# Patient Record
Sex: Male | Born: 1940
Health system: Southern US, Community
[De-identification: ages and names within clinical notes are randomized; demographics above are authoritative.]

## PROBLEM LIST (undated history)

## (undated) DIAGNOSIS — R519 Headache, unspecified: Secondary | ICD-10-CM

## (undated) DIAGNOSIS — M1990 Unspecified osteoarthritis, unspecified site: Secondary | ICD-10-CM

## (undated) DIAGNOSIS — C439 Malignant melanoma of skin, unspecified: Secondary | ICD-10-CM

## (undated) DIAGNOSIS — I719 Aortic aneurysm of unspecified site, without rupture: Secondary | ICD-10-CM

## (undated) DIAGNOSIS — G4762 Sleep related leg cramps: Secondary | ICD-10-CM

## (undated) DIAGNOSIS — IMO0001 Reserved for inherently not codable concepts without codable children: Secondary | ICD-10-CM

## (undated) DIAGNOSIS — H8102 Meniere's disease, left ear: Secondary | ICD-10-CM

## (undated) DIAGNOSIS — M419 Scoliosis, unspecified: Secondary | ICD-10-CM

## (undated) DIAGNOSIS — K56609 Unspecified intestinal obstruction, unspecified as to partial versus complete obstruction: Secondary | ICD-10-CM

## (undated) DIAGNOSIS — IMO0002 Reserved for concepts with insufficient information to code with codable children: Secondary | ICD-10-CM

## (undated) DIAGNOSIS — R55 Syncope and collapse: Secondary | ICD-10-CM

## (undated) DIAGNOSIS — B029 Zoster without complications: Secondary | ICD-10-CM

## (undated) DIAGNOSIS — R51 Headache: Secondary | ICD-10-CM

## (undated) DIAGNOSIS — K219 Gastro-esophageal reflux disease without esophagitis: Secondary | ICD-10-CM

## (undated) DIAGNOSIS — I10 Essential (primary) hypertension: Secondary | ICD-10-CM

## (undated) DIAGNOSIS — E7800 Pure hypercholesterolemia, unspecified: Secondary | ICD-10-CM

## (undated) HISTORY — PX: TONSILLECTOMY: SUR1361

## (undated) HISTORY — DX: Reserved for inherently not codable concepts without codable children: IMO0001

## (undated) HISTORY — DX: Malignant melanoma of skin, unspecified: C43.9

## (undated) HISTORY — PX: MELANOMA EXCISION: SHX5266

## (undated) HISTORY — PX: OTHER SURGICAL HISTORY: SHX169

## (undated) HISTORY — DX: Reserved for concepts with insufficient information to code with codable children: IMO0002

## (undated) HISTORY — DX: Unspecified intestinal obstruction, unspecified as to partial versus complete obstruction: K56.609

## (undated) HISTORY — PX: VASECTOMY: SHX75

## (undated) HISTORY — PX: COLONOSCOPY: SHX174

## (undated) HISTORY — PX: CATARACT EXTRACTION W/ INTRAOCULAR LENS  IMPLANT, BILATERAL: SHX1307

## (undated) HISTORY — PX: HERNIA REPAIR: SHX51

## (undated) HISTORY — PX: APPENDECTOMY: SHX54

## (undated) HISTORY — PX: COLON SURGERY: SHX602

---

## 2000-03-24 ENCOUNTER — Encounter: Admission: RE | Admit: 2000-03-24 | Discharge: 2000-03-24 | Payer: Self-pay | Admitting: *Deleted

## 2000-03-24 ENCOUNTER — Encounter: Payer: Self-pay | Admitting: *Deleted

## 2002-11-25 ENCOUNTER — Ambulatory Visit (HOSPITAL_BASED_OUTPATIENT_CLINIC_OR_DEPARTMENT_OTHER): Admission: RE | Admit: 2002-11-25 | Discharge: 2002-11-25 | Payer: Self-pay | Admitting: Surgery

## 2006-04-20 ENCOUNTER — Encounter: Admission: RE | Admit: 2006-04-20 | Discharge: 2006-04-20 | Payer: Self-pay | Admitting: Internal Medicine

## 2007-06-01 ENCOUNTER — Ambulatory Visit: Payer: Self-pay | Admitting: Gastroenterology

## 2007-06-09 ENCOUNTER — Ambulatory Visit: Payer: Self-pay | Admitting: Gastroenterology

## 2008-11-06 ENCOUNTER — Encounter: Payer: Self-pay | Admitting: Gastroenterology

## 2009-05-07 ENCOUNTER — Encounter: Payer: Self-pay | Admitting: Gastroenterology

## 2010-02-01 ENCOUNTER — Encounter: Payer: Self-pay | Admitting: Gastroenterology

## 2010-07-23 NOTE — Letter (Signed)
Summary: [facility]  [facility]   Imported By: Lester Benld 02/26/2010 12:04:31  _____________________________________________________________________  External Attachment:    Type:   Image     Comment:   External Document

## 2010-11-08 NOTE — Op Note (Signed)
NAME:  George Montoya, George Montoya                         ACCOUNT NO.:  [id number]   MEDICAL RECORD NO.:  [id number]                   PATIENT TYPE:  AMB   LOCATION:  DSC                                  FACILITY:  [facility]   PHYSICIAN:  Currie Paris, M.D.           DATE OF BIRTH:  1941/03/13   DATE OF PROCEDURE:  11/25/2002  DATE OF DISCHARGE:                                 OPERATIVE REPORT   OFFICE MEDICAL RECORD NUMBER:  [id number]   PREOPERATIVE DIAGNOSIS:  Right inguinal hernia.   POSTOPERATIVE DIAGNOSIS:  Right inguinal hernia.   OPERATION:  Repair of right inguinal hernia with mesh.   SURGEON:  Currie Paris, M.D.   ANESTHESIA:  MAC.   CLINICAL HISTORY:  This is a 70 year old gentleman with a right inguinal  hernia he desired to have repaired.   DESCRIPTION OF PROCEDURE:  The patient seen in the holding area and had no  further questions.  The right inguinal area was identified as the operative  site and marked.  He was taken to the operating room and given IV sedation.  The groin area was shaved, prepped, and draped.  A combination of 1%  Xylocaine with epinephrine and 0.5% plain Marcaine was mixed in equal parts  and used for local.  It was infiltrated along the incision line and  infiltrated above the anterior-superior iliac spine subfascially.  Incision  was made and deep in the external oblique aponeurosis with additional local  infiltrate as we got into deeper layers.  Bleeders were electrocoagulated or  tied with 4-0 Vicryl.  The external oblique was cleared off and opened in  line of its fibers and elevated off the underlying cord structures.  The  cord was dissected up off the inguinal floor.  There was a protrusion of  preperitoneal fat coming through adjacent to the cord and was stripped off  the cord and reduced.  This appeared to be basically an indirect defect with  a very large, deep ring, although this did not have the usual occurrence of  a true sac.   Cord was skeletonized.  We did not see any other sacs coming  through.  The medial aspect of the floor appeared intact.   I took a large mesh plug and inserted it into the laterally located defect  and closed it with a 2-0 Prolene.  The ilioinguinal nerve appeared to be  coming nicely through with the cord structures.   I then placed the mesh patch on and laid it over the entire floor and let  the tails go laterally around the cord.  I used 2-0 Prolene to suture it in  with a running suture along the inferior aspect and then tacked it medially  and superiorly to the internal oblique.  The tails were tacked together  where they crossed to recreate a deep ring.  Everything appeared to be dry,  and there appeared to  be no tension on this, and it laid nicely.   The external oblique was then closed with a running 3-0 Vicryl, Scarpa's  with a running 3-0 Vicryl, and the skin with 4-0 Monocryl subcuticular plus  some Dermabond.   The patient tolerated the procedure well.  There were no operative  complications, and all counts were correct.                                               Currie Paris, M.D.    CJS/MEDQ  D:  11/25/2002  T:  11/25/2002  Job:  [id number]   cc:   Wilson Singer, M.D.  [address]  [place]  [place] [address]  Fax: [phone]

## 2014-02-01 ENCOUNTER — Encounter (HOSPITAL_COMMUNITY): Payer: Self-pay | Admitting: Emergency Medicine

## 2014-02-01 ENCOUNTER — Emergency Department (HOSPITAL_COMMUNITY): Payer: 59

## 2014-02-01 ENCOUNTER — Ambulatory Visit (INDEPENDENT_AMBULATORY_CARE_PROVIDER_SITE_OTHER): Payer: 59 | Admitting: Family Medicine

## 2014-02-01 ENCOUNTER — Emergency Department (HOSPITAL_COMMUNITY)
Admission: EM | Admit: 2014-02-01 | Discharge: 2014-02-01 | Disposition: A | Discharge status: Home / Self Care | Payer: 59 | Attending: Emergency Medicine | Admitting: Emergency Medicine

## 2014-02-01 VITALS — BP 190/92 | HR 61 | Temp 97.9°F | Resp 16 | Ht 68.0 in | Wt 195.0 lb

## 2014-02-01 DIAGNOSIS — R509 Fever, unspecified: Secondary | ICD-10-CM | POA: Diagnosis not present

## 2014-02-01 DIAGNOSIS — R4020 Unspecified coma: Secondary | ICD-10-CM

## 2014-02-01 DIAGNOSIS — Z79899 Other long term (current) drug therapy: Secondary | ICD-10-CM | POA: Insufficient documentation

## 2014-02-01 DIAGNOSIS — I10 Essential (primary) hypertension: Secondary | ICD-10-CM

## 2014-02-01 DIAGNOSIS — E7800 Pure hypercholesterolemia, unspecified: Secondary | ICD-10-CM | POA: Insufficient documentation

## 2014-02-01 DIAGNOSIS — R404 Transient alteration of awareness: Secondary | ICD-10-CM | POA: Insufficient documentation

## 2014-02-01 DIAGNOSIS — R55 Syncope and collapse: Secondary | ICD-10-CM | POA: Diagnosis not present

## 2014-02-01 DIAGNOSIS — Z87891 Personal history of nicotine dependence: Secondary | ICD-10-CM | POA: Diagnosis not present

## 2014-02-01 DIAGNOSIS — E871 Hypo-osmolality and hyponatremia: Secondary | ICD-10-CM | POA: Diagnosis not present

## 2014-02-01 HISTORY — DX: Pure hypercholesterolemia, unspecified: E78.00

## 2014-02-01 HISTORY — DX: Essential (primary) hypertension: I10

## 2014-02-01 LAB — POCT CBC
Granulocyte percent: 87.4 %G — AB (ref 37–80)
HCT, POC: 42.7 % — AB (ref 43.5–53.7)
Hemoglobin: 14.3 g/dL (ref 14.1–18.1)
Lymph, poc: 0.5 — AB (ref 0.6–3.4)
MCH, POC: 34.3 pg — AB (ref 27–31.2)
MCHC: 33.4 g/dL (ref 31.8–35.4)
MCV: 102.6 fL — AB (ref 80–97)
MID (cbc): 0.4 (ref 0–0.9)
MPV: 6.5 fL (ref 0–99.8)
POC Granulocyte: 6.3 (ref 2–6.9)
POC LYMPH PERCENT: 7.2 %L — AB (ref 10–50)
POC MID %: 5.4 %M (ref 0–12)
Platelet Count, POC: 189 10*3/uL (ref 142–424)
RBC: 4.16 M/uL — AB (ref 4.69–6.13)
RDW, POC: 12.8 %
WBC: 7.2 10*3/uL (ref 4.6–10.2)

## 2014-02-01 LAB — BASIC METABOLIC PANEL
Anion gap: 18 — ABNORMAL HIGH (ref 5–15)
BUN: 12 mg/dL (ref 6–23)
CALCIUM: 9.4 mg/dL (ref 8.4–10.5)
CO2: 21 mEq/L (ref 19–32)
Chloride: 88 mEq/L — ABNORMAL LOW (ref 96–112)
Creatinine, Ser: 0.82 mg/dL (ref 0.50–1.35)
GFR calc Af Amer: >90 mL/min (ref >90)
GFR, EST NON AFRICAN AMERICAN: 86 mL/min — AB (ref >90)
Glucose, Bld: 111 mg/dL — ABNORMAL HIGH (ref 70–99)
Potassium: 3.7 mEq/L (ref 3.7–5.3)
Sodium: 127 mEq/L — ABNORMAL LOW (ref 137–147)

## 2014-02-01 LAB — CBC
HEMATOCRIT: 37.6 % — AB (ref 39.0–52.0)
Hemoglobin: 13.6 g/dL (ref 13.0–17.0)
MCH: 35 pg — AB (ref 26.0–34.0)
MCHC: 36.2 g/dL — AB (ref 30.0–36.0)
MCV: 96.7 fL (ref 78.0–100.0)
Platelets: 174 10*3/uL (ref 150–400)
RBC: 3.89 MIL/uL — ABNORMAL LOW (ref 4.22–5.81)
RDW: 11.9 % (ref 11.5–15.5)
WBC: 7.6 10*3/uL (ref 4.0–10.5)

## 2014-02-01 LAB — I-STAT TROPONIN, ED: Troponin i, poc: 0 ng/mL (ref 0.00–0.08)

## 2014-02-01 LAB — GLUCOSE, POCT (MANUAL RESULT ENTRY): POC GLUCOSE: 112 mg/dL — AB (ref 70–99)

## 2014-02-01 MED ORDER — SODIUM CHLORIDE 0.9 % IV SOLN
Freq: Once | INTRAVENOUS | Status: AC
  Administered 2014-02-01: 21:00:00 via INTRAVENOUS

## 2014-02-01 NOTE — ED Notes (Signed)
Pt reports last night have fever/chills and then today had witnessed syncopal episode at 1500 lasting approx 1 minute. bp is 185/81 at triage.

## 2014-02-01 NOTE — ED Notes (Signed)
Pt placed in gown and in bed. PT monitored by pulse ox, bp cuff, and 12-lead.

## 2014-02-01 NOTE — ED Provider Notes (Signed)
CSN: [id number]     Arrival date & time 02/01/14  1710 History   First MD Initiated Contact with Patient 02/01/14 1853     Chief Complaint  Patient presents with  . Loss of Consciousness     (Consider location/radiation/quality/duration/timing/severity/associated sxs/prior Treatment) Patient is a 73 y.o. male presenting with syncope.  Loss of Consciousness Associated symptoms: diaphoresis and fever   Associated symptoms: no chest pain, no dizziness, no headaches, no nausea, no palpitations, no shortness of breath, no vomiting and no weakness     George Montoya is a 73 year old man with Meniere disease and HTN who presents after syncopal episode this afternoon. He was sitting at his desk at work when he had a witnessed ~1 minute loss of consciousness. He started feeling lightheaded with vision changes when he lost consciousness. A coworker said his eyes rolled back in his head which nodded off to the side. He did not fall out of the chair or hit his head. He then woke up and did not remember the event but also was not posticteric. He denied any loss of bowel or bladder continence. There were no other observed movements besides the eye roll and head nod. He woke up drenched in sweat. His [place] prior [place] the [place] ~ [place] when he believes he was dehydrated. He says this is different than his episodes of Meniere as he was not dizzy or nauseous. He says he had diarrhea last night without hematochezia or melena and a subjective fever after an epson salt bath. He initially went to urgent care who sent him to [facility].   Past Medical History  Diagnosis Date  . High cholesterol   . Hypertension    History reviewed. No pertinent past surgical history. History reviewed. No pertinent family history. History  Substance Use Topics  . Smoking status: Former Research scientist (life sciences)  . Smokeless tobacco: Not on file  . Alcohol Use: No     Comment: occ    Review of Systems  Constitutional:  Positive for fever, chills and diaphoresis. Negative for fatigue and unexpected weight change.  Respiratory: Negative for shortness of breath.   Cardiovascular: Positive for syncope. Negative for chest pain and palpitations.  Gastrointestinal: Positive for diarrhea. Negative for nausea, vomiting, abdominal pain and constipation.  Neurological: Positive for syncope and light-headedness. Negative for dizziness, speech difficulty, weakness, numbness and headaches.      Allergies  Adhesive  Home Medications   Prior to Admission medications   Medication Sig Start Date End Date Taking? Authorizing Provider  carvedilol (COREG) 25 MG tablet Take 25 mg by mouth every morning.    Yes Historical Provider, MD  Chlorpheniramine Maleate (ALLERGY PO) Take 1 tablet by mouth every morning.   Yes Historical Provider, MD  CHOLECALCIFEROL PO Take 600 mg by mouth every morning.   Yes Historical Provider, MD  diphenhydrAMINE (BENADRYL) 25 MG tablet Take 25 mg by mouth every morning.   Yes Historical Provider, MD  Multiple Vitamins-Minerals (MULTIVITAL) tablet Take 1 tablet by mouth daily.   Yes Historical Provider, MD  rosuvastatin (CRESTOR) 10 MG tablet Take 10 mg by mouth every Monday, Wednesday, and Friday. In AM   Yes Historical Provider, MD  telmisartan-hydrochlorothiazide (MICARDIS HCT) 40-12.5 MG per tablet Take 1 tablet by mouth every morning.    Yes Historical Provider, MD   BP 158/82  Pulse 63  Temp(Src) 97.9 F (36.6 C) (Oral)  Resp 18  SpO2 98% Physical Exam  Constitutional:  He is oriented to person, place, and time. He appears well-developed and well-nourished. No distress.  HENT:  Head: Normocephalic and atraumatic.  Mouth/Throat: Oropharynx is clear and moist.  Eyes: EOM are normal. Pupils are equal, round, and reactive to light. No scleral icterus.  Neck: No JVD present.  No carotid bruit  Cardiovascular: Normal rate, regular rhythm, normal heart sounds and intact distal pulses.  Exam  reveals no gallop and no friction rub.   No murmur heard. Pulmonary/Chest: Effort normal and breath sounds normal. No respiratory distress. He has no wheezes. He has no rales. He exhibits no tenderness.  Abdominal: Soft. Bowel sounds are normal. He exhibits no distension. There is no tenderness.  Musculoskeletal: He exhibits no edema.  Neurological: He is alert and oriented to person, place, and time. He has normal reflexes. No cranial nerve deficit. He exhibits normal muscle tone. Coordination normal.  Skin: He is not diaphoretic.    ED Course  Procedures (including critical care time) Labs Review Labs Reviewed  CBC - Abnormal; Notable for the following:    RBC 3.89 (*)    HCT 37.6 (*)    MCH 35.0 (*)    MCHC 36.2 (*)    All other components within normal limits  BASIC METABOLIC PANEL - Abnormal; Notable for the following:    Sodium 127 (*)    Chloride 88 (*)    Glucose, Bld 111 (*)    GFR calc non Af Amer 86 (*)    Anion gap 18 (*)    All other components within normal limits  Randolm Idol, ED    Imaging Review Dg Chest 2 View  02/01/2014   CLINICAL DATA:  Hypertension.  Loss of consciousness.  EXAM: CHEST  2 VIEW  COMPARISON:  None.  FINDINGS: Heart, mediastinum hila are unremarkable. Lungs are clear. No pleural effusion or pneumothorax  Bony thorax is demineralized but intact.  IMPRESSION: No active cardiopulmonary disease.   Electronically Signed   By: Lajean Manes M.D.   On: 02/01/2014 20:23     EKG Interpretation   Date/Time:  Wednesday February 01 2014 17:31:25 EDT Ventricular Rate:  60 PR Interval:  180 QRS Duration: 90 QT Interval:  428 QTC Calculation: 428 R Axis:   10 Text Interpretation:  Normal sinus rhythm Normal ECG Confirmed by BEATON   MD, ROBERT ([id number]) on 02/01/2014 7:52:41 PM      MDM   Final diagnoses:  None    7:34PM: Patient has observed syncopal episode in setting of hyponatremia and hypochloremia. He appeared euvolemic on exam and had  BUN/creatinine ratio of 15. Also dehydration not likely in setting of elevated BP. EKG normal sinus rhythm and troponin negative. He thinks this is different than episodes of Meniere disease as he was not dizzy or nauseous. Start NS infusion at 75 cc/hr. Head CT not ordered at this time as no headache, head trauma (he was sitting and observed the whole time), neuro deficits on exam. He will likely need to be admitted with further workup as in-patient.  10:03PM: Dr. Audie Pinto discussed admission with the patient and the importance of cardiac monitoring for arrhythmia. Patient says he will not be admitted although wife wants him to be. Dr. Audie Pinto spoke to Dr. Sharlett Iles who is the patient's PCP who said he will see the patient first thing in the morning and felt comfortable with his discharge under such close follow-up.  Kelby Aline, MD 02/01/14 [phone]

## 2014-02-01 NOTE — ED Provider Notes (Signed)
I saw and evaluated the patient, reviewed the resident's note and I agree with the findings and plan.   .Face to face Exam:  General:  Awake HEENT:  Atraumatic Resp:  Normal effort Abd:  Nondistended Neuro:No focal weakness  I spoke with Dr. Sharlett Iles who agreed to see the patient in the office tomorrow.  I did recommend patient be admitted that he did not want to be admitted and will followup as instructed. .After treatment in the ED the patient feels back to baseline and wants to go home.  Dot Lanes, MD 02/01/14 2214

## 2014-02-01 NOTE — Progress Notes (Signed)
[facility] [address] [place] [address] [phone]  Date:  02/01/2014   Name:  George Montoya   DOB:  04/12/1941   MRN:  [id number]  PCP:  No primary provider on file.    Chief Complaint: Seizures   History of Present Illness:  George Montoya is a 73 y.o. very pleasant male patient who presents with the following:  He had an apparent LOC/ ?seizure about an hour ago.  This was observed by a friend of his. He was sitting in a chair talking, then just seemed to lean back, "my eyes rolled back" and he lost consciousness.  Apparently he was out for a mintue or so. No incont.  It does not sound as though he had any tonic/ clonic movements.  This has never happened in the past.  Right now he feels "a little spacy" but overall not bad.   He did have blood drawn this am for his upcoming physical. He did notice diarrhea yesterday.  He thought this might be due to eating a lot of fruit lately.  He also took a hot epson salts bath last night.  He got a chill last night, then took his temp and it was 100 degrees.   Hie PCP is Dr. Sharlett Iles He does not have any CP, no SOB.  No headache  There are no active problems to display for this patient.   No past medical history on file.  No past surgical history on file.  History  Substance Use Topics  . Smoking status: Not on file  . Smokeless tobacco: Not on file  . Alcohol Use: Not on file    No family history on file.  No Known Allergies  Medication list has been reviewed and updated.  No current outpatient prescriptions on file prior to visit.   No current facility-administered medications on file prior to visit.    Review of Systems:  As per HPI- otherwise negative.   Physical Examination: Filed Vitals:   02/01/14 1555  BP: 190/92  Pulse: 61  Temp: 97.9 F (36.6 C)  Resp: 16   Filed Vitals:   02/01/14 1555  Height: 5\' 8"  (1.727 m)  Weight: 195 lb (88.451 kg)   Body mass index is 29.66  kg/(m^2). Ideal Body Weight: Weight in (lb) to have BMI = 25: 164.1  GEN: WDWN, NAD, Non-toxic, A & O x 3, overweight, looks well HEENT: Atraumatic, Normocephalic. Neck supple. No masses, No LAD.  Bilateral TM wnl, oropharynx normal.  PEERL,EOMI.   Ears and Nose: No external deformity. CV: RRR, No M/G/R. No JVD. No thrill. No extra heart sounds. PULM: CTA B, no wheezes, crackles, rhonchi. No retractions. No resp. distress. No accessory muscle use. ABD: S, NT, ND, +BS. No rebound. No HSM. EXTR: No c/c/e NEURO Normal gait. Normal strength, sensation and DTR all extremities.   PSYCH: Normally interactive. Conversant. Not depressed or anxious appearing.  Calm demeanor.   EKG: no acute abnormality   Results for orders placed in visit on 02/01/14  POCT CBC      Result Value Ref Range   WBC 7.2  4.6 - 10.2 K/uL   Lymph, poc 0.5 (*) 0.6 - 3.4   POC LYMPH PERCENT 7.2 (*) 10 - 50 %L   MID (cbc) 0.4  0 - 0.9   POC MID % 5.4  0 - 12 %M   POC Granulocyte 6.3  2 - 6.9   Granulocyte percent 87.4 (*)  37 - 80 %G   RBC 4.16 (*) 4.69 - 6.13 M/uL   Hemoglobin 14.3  14.1 - 18.1 g/dL   HCT, POC 42.7 (*) 43.5 - 53.7 %   MCV 102.6 (*) 80 - 97 fL   MCH, POC 34.3 (*) 27 - 31.2 pg   MCHC 33.4  31.8 - 35.4 g/dL   RDW, POC 12.8     Platelet Count, POC 189  142 - 424 K/uL   MPV 6.5  0 - 99.8 fL  GLUCOSE, POCT (MANUAL RESULT ENTRY)      Result Value Ref Range   POC Glucose 112 (*) 70 - 99 mg/dl    Assessment and Plan: Loss of consciousness - Plan: POCT CBC, POCT glucose (manual entry), EKG 12-Lead  Essential hypertension   LOC event earlier today.  No apparent cause here at clinic.  Needs further evaluation.  Also has quite high BP- we have not seen him before so do not have any old readings, he feels this is higher than usual due to "white coat" anxiety.  Offered EMS transport- he declines this but his daughter is here to take him to [facility] ER  Signed Lamar Blinks, MD

## 2014-02-01 NOTE — Discharge Instructions (Signed)
You were seen in the ED today for loss of consciousness and found to have low sodium. We have spoken to your primary care provider, Dr. Sharlett Iles, and he has agreed to see you first thing tomorrow morning. Please call his office tomorrow morning to arrange. Please seek medical attention or return to the ED if you have any new loss of consciousness, chest pain, shortness of breath, or other worrisome medical condition.

## 2014-03-20 ENCOUNTER — Encounter: Payer: Self-pay | Admitting: Cardiovascular Disease

## 2014-03-20 ENCOUNTER — Ambulatory Visit (INDEPENDENT_AMBULATORY_CARE_PROVIDER_SITE_OTHER): Payer: 59 | Admitting: Cardiovascular Disease

## 2014-03-20 VITALS — BP 160/96 | HR 65 | Resp 16 | Ht 68.0 in | Wt 193.4 lb

## 2014-03-20 DIAGNOSIS — R55 Syncope and collapse: Secondary | ICD-10-CM

## 2014-03-20 DIAGNOSIS — I10 Essential (primary) hypertension: Secondary | ICD-10-CM

## 2014-03-20 MED ORDER — CARVEDILOL 12.5 MG PO TABS: 12.5000 mg | ORAL_TABLET | Freq: Two times a day (BID) | ORAL | Status: DC

## 2014-03-20 MED ORDER — TELMISARTAN-HCTZ 40-12.5 MG PO TABS: 0.5000 | ORAL_TABLET | Freq: Every day | ORAL | Status: DC

## 2014-03-20 NOTE — Patient Instructions (Signed)
DECREASE Carvedilol to 12.5mg  twice a day.  Take Telmisartan HCTZ 40/12.5mg   1/2 tablet daily.  Your physician has recommended that you wear an event monitor after wearing the BP Monitor for two weeks.  Event monitors are medical devices that record the heart's electrical activity. Doctors most often [place] these monitors to diagnose arrhythmias. Arrhythmias are problems with the speed or rhythm of the heartbeat. The monitor is a small, portable device. You can wear one while you do your normal daily activities. This is usually used to diagnose what is causing palpitations/syncope (passing out).  Dr. Sallyanne Kuster recommends that you schedule a follow-up appointment in: 3 months.

## 2014-03-21 ENCOUNTER — Encounter: Payer: Self-pay | Admitting: Cardiovascular Disease

## 2014-03-21 ENCOUNTER — Telehealth: Payer: Self-pay | Admitting: Cardiovascular Disease

## 2014-03-21 DIAGNOSIS — I10 Essential (primary) hypertension: Secondary | ICD-10-CM | POA: Insufficient documentation

## 2014-03-21 DIAGNOSIS — R55 Syncope and collapse: Secondary | ICD-10-CM | POA: Insufficient documentation

## 2014-03-21 NOTE — Progress Notes (Signed)
Patient ID: George Montoya, male   DOB: 02-Jun-1941, 73 y.o.   MRN: [id number]      Reason for office visit Hypertension, history of syncope  This is Mr. Rue first cardiology evaluation. He has a long-standing history of hypertension. 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 He thinks this was because of stress, weight gain and inactivity . Now that the professional situation has improved, he has been trying to reduce the doses of his antihypertensive medications. His blood pressure today is rather high and was also high when he last saw his primary care physician.  In July, she had an episode of syncope that required brief emergency room evaluation and treatment. He had severe diarrhea with no nausea and vomiting for approximately 24 hours.  Overnight he had chills and felt weak, but that morning he was feeling a little better. The next day he was sitting up in a chair at work when he suddenly lost consciousness.The loss of consciousness was very abrupt, without prodrome. Within about a minute he had recovered. In the emergency room his blood pressure was initially high. Laboratory tests showed marked hyponatremia but were otherwise unremarkable. He recalls having a presyncopal episode earlier in the year that resolved after he laid flat on the floor for a few minutes.   Allergies  Allergen Reactions  . Adhesive [Tape] Rash     Current Outpatient Prescriptions  Medication Sig Dispense Refill  . carvedilol (COREG) 12.5 MG tablet  he was taking 25 mg once daily   180 tablet  3  . Chlorpheniramine Maleate (ALLERGY PO) Take 1 tablet by mouth every morning.      . CHOLECALCIFEROL PO Take 600 mg by mouth every morning.      . diphenhydrAMINE (BENADRYL) 25 MG tablet Take 25 mg by mouth every morning.      . Multiple Vitamins-Minerals (MULTIVITAL) tablet Take 1 tablet by mouth daily.      . rosuvastatin  (CRESTOR) 10 MG tablet Take 10 mg by mouth every Monday, Wednesday, and Friday. In AM      . telmisartan-hydrochlorothiazide (MICARDIS HCT) 40-12.5 MG per tablet he was taking one tablet 3 days a week   45 tablet  3   No current facility-administered medications for this visit.    Past Medical History  Diagnosis Date  . High cholesterol   . Hypertension     No past major surgery  No family history of inheritable cardiac problems.  History   Social History  . Marital Status: Married    Spouse Name: N/A    Number of Children: N/A  . Years of Education: N/A   Occupational History  . Not on file.   Social History Main Topics  . Smoking status: Former Smoker -- 25 years    Quit date: 10/21/1981  . Smokeless tobacco: Not on file  . Alcohol Use: 10.0 oz/week    20 drink(s) per week     Comment: occ  . Drug Use: No  . Sexual Activity: Not on file   Other Topics Concern  . Not on file   Social History Narrative  . No narrative on file    Review of systems: The patient specifically denies any chest pain at rest or with exertion, dyspnea at rest or with exertion, orthopnea, paroxysmal nocturnal dyspnea,  palpitations, focal neurological deficits, intermittent claudication, lower extremity edema, unexplained weight gain, cough, hemoptysis or wheezing.  The patient also  denies abdominal pain, nausea, vomiting, dysphagia, diarrhea, constipation, polyuria, polydipsia, dysuria, hematuria, frequency, urgency, abnormal bleeding or bruising, fever, chills, unexpected weight changes, mood swings, change in skin or hair texture, change in voice quality, auditory or visual problems, allergic reactions or rashes, new musculoskeletal complaints other than usual "aches and pains".   PHYSICAL EXAM BP 160/96  Pulse 65  Resp 16  Ht 5\' 8"  (1.727 m)  Wt 87.726 kg (193 lb 6.4 oz)  BMI 29.41 kg/m2  General: Alert, oriented x3, no distress Head: no evidence of trauma, PERRL, EOMI, no  exophtalmos or lid lag, no myxedema, no xanthelasma; normal ears, nose and oropharynx Neck: normal jugular venous pulsations and no hepatojugular reflux; brisk carotid pulses without delay and no carotid bruits Chest: clear to auscultation, no signs of consolidation by percussion or palpation, normal fremitus, symmetrical and full respiratory excursions Cardiovascular: normal position and quality of the apical impulse, regular rhythm, normal first and second heart sounds, no murmurs, rubs, loud S4 Abdomen: no tenderness or distention, no masses by palpation, no abnormal pulsatility or arterial bruits, normal bowel sounds, no hepatosplenomegaly Extremities: no clubbing, cyanosis or edema; 2+ radial, ulnar and brachial pulses bilaterally; 2+ right femoral, posterior tibial and dorsalis pedis pulses; 2+ left femoral, posterior tibial and dorsalis pedis pulses; no subclavian or femoral bruits Neurological: grossly nonfocal   EKG: Normal sinus rhythm, normal tracing. No evidence of LVH. Normal QTC 430 ms  Lipid Panel  No results found for this basename: chol, trig, hdl, cholhdl, vldl, ldlcalc, ldldirect    BMET    Component Value Date/Time   NA 127* 02/01/2014 1731   K 3.7 02/01/2014 1731   CL 88* 02/01/2014 1731   CO2 21 02/01/2014 1731   GLUCOSE 111* 02/01/2014 1731   BUN 12 02/01/2014 1731   CREATININE 0.82 02/01/2014 1731   CALCIUM 9.4 02/01/2014 1731   GFRNONAA 86* 02/01/2014 1731   GFRAA >90 02/01/2014 1731     ASSESSMENT AND PLAN Syncope The circumstances of his syncopal events (preceded by perfuse diarrhea and chills) in the presence of marked hyponatremia suggest that he had hypotensive syncope due to hypovolemia. On the other hand, the extremely abrupt nature of his syncope, without prodrome is more in keeping with arrhythmic syncope. I suggested he wear a 14 day event monitor.  HTN (hypertension) Although not seen on ECG, his physical exam suggest the presence of left ventricular  hypertrophy. He has an abnormal heart sound. I recommended an echocardiogram. This would also help alleviate concerns about structural heart disease as a cause of syncope. I told him that the current regimen of antihypertensive medications would likely lead to volatility in his blood pressure, since he is taking olmesartan only every other day and carvedilol only once a day, on a schedule that allows complete resolution of antihypertensive effect before the next dose. I understand that he is concerned about "overmedication" . I recommended a lower dose of carvedilol, but dosed twice daily (12.5 mg twice a day) and a lower dose of telmisartan (Micardis HCT half of a 40-12.5 mg tablet) on a daily basis.   Orders Placed This Encounter  Procedures  . EKG 12-Lead  . Cardiac event monitor   Meds ordered this encounter  Medications  . carvedilol (COREG) 12.5 MG tablet    Sig: Take 1 tablet (12.5 mg total) by mouth 2 (two) times daily.    Dispense:  180 tablet    Refill:  3  . telmisartan-hydrochlorothiazide (MICARDIS HCT) 40-12.5 MG per  tablet    Sig: Take 0.5 tablets by mouth daily.    Dispense:  45 tablet    Refill:  [place] Roddrick Sharron, MD, [facility] [phone] office [phone] pager

## 2014-03-21 NOTE — Assessment & Plan Note (Signed)
The circumstances of his syncopal events (preceded by perfuse diarrhea and chills) in the presence of marked hyponatremia suggest that he had hypotensive syncope due to hypovolemia. On the other hand, the extremely abrupt nature of his syncope, without prodrome is more in keeping with arrhythmic syncope. I suggested he wear a 14 day event monitor.

## 2014-03-21 NOTE — Telephone Encounter (Signed)
Closed encounter °

## 2014-03-21 NOTE — Assessment & Plan Note (Signed)
Although not seen on ECG, his physical exam suggest the presence of left ventricular hypertrophy. He has an abnormal heart sound. I recommended an echocardiogram. This would also help alleviate concerns about structural heart disease as a cause of syncope. I told him that the current regimen of antihypertensive medications would likely lead to extreme volatility of his blood pressure since he is taking olmesartan only every other day and carvedilol only once a day, on a schedule that allows complete resolution of antihypertensive effect before the next dose. I understand that he iss concerned about "overmedication" . I recommended a lower dose of carvedilol but dosed twice daily (12.5 mg twice a day) and a lower dose of telmisartan (Micardis HCT half of a 40-12.5 mg tablet) on a daily basis.

## 2014-03-22 ENCOUNTER — Telehealth: Payer: Self-pay | Admitting: *Deleted

## 2014-03-22 NOTE — Telephone Encounter (Signed)
Discuss echo with his wife and left message on patient's cell # to get back to me to discuss having an echo done.

## 2014-03-22 NOTE — Telephone Encounter (Signed)
Message copied by Tressa Busman on Wed Mar 22, 2014  3:50 PM ------      Message from: Sanda Klein      Created: Tue Mar 21, 2014  8:51 AM       Please tell Mr. Chalmers that after reviewing his records I think it would be worthwhile that he have an echocardiogram. Please schedule it if he is agreeable. ------

## 2014-03-24 ENCOUNTER — Telehealth: Payer: Self-pay | Admitting: Cardiovascular Disease

## 2014-03-24 NOTE — Telephone Encounter (Signed)
Closed encounter °

## 2014-03-27 ENCOUNTER — Telehealth: Payer: Self-pay | Admitting: *Deleted

## 2014-03-27 DIAGNOSIS — I517 Cardiomegaly: Secondary | ICD-10-CM

## 2014-03-27 DIAGNOSIS — R55 Syncope and collapse: Secondary | ICD-10-CM

## 2014-03-27 NOTE — Telephone Encounter (Signed)
Message copied by Tressa Busman on Mon Mar 27, 2014  1:12 PM ------      Message from: Miquel Dunn D      Created: Mon Mar 27, 2014  9:47 AM       Hey There      I need an order for an echo on this patient.            Thanks      Ebony ------

## 2014-03-27 NOTE — Telephone Encounter (Signed)
Patient has agreed to have an Echo - order placed - ebony will schedule.

## 2014-03-29 ENCOUNTER — Ambulatory Visit (HOSPITAL_COMMUNITY)
Admission: RE | Admit: 2014-03-29 | Discharge: 2014-03-29 | Disposition: A | Discharge status: Home / Self Care | Payer: 59 | Source: Ambulatory Visit | Attending: Cardiovascular Disease | Admitting: Cardiovascular Disease

## 2014-03-29 DIAGNOSIS — I10 Essential (primary) hypertension: Secondary | ICD-10-CM | POA: Insufficient documentation

## 2014-03-29 DIAGNOSIS — Z87891 Personal history of nicotine dependence: Secondary | ICD-10-CM | POA: Diagnosis not present

## 2014-03-29 DIAGNOSIS — R55 Syncope and collapse: Secondary | ICD-10-CM

## 2014-03-29 DIAGNOSIS — I517 Cardiomegaly: Secondary | ICD-10-CM

## 2014-03-29 DIAGNOSIS — E785 Hyperlipidemia, unspecified: Secondary | ICD-10-CM | POA: Insufficient documentation

## 2014-03-29 DIAGNOSIS — I519 Heart disease, unspecified: Secondary | ICD-10-CM

## 2014-03-29 NOTE — Progress Notes (Signed)
2D Echocardiogram Complete.  03/29/2014   George Montoya [place], [place]

## 2014-05-13 ENCOUNTER — Encounter (HOSPITAL_COMMUNITY): Payer: Self-pay | Admitting: Emergency Medicine

## 2014-05-13 ENCOUNTER — Emergency Department (HOSPITAL_COMMUNITY)
Admission: EM | Admit: 2014-05-13 | Discharge: 2014-05-13 | Disposition: A | Discharge status: Home / Self Care | Payer: 59 | Source: Home / Self Care | Attending: Emergency Medicine | Admitting: Emergency Medicine

## 2014-05-13 DIAGNOSIS — B0230 Zoster ocular disease, unspecified: Secondary | ICD-10-CM

## 2014-05-13 MED ORDER — VALACYCLOVIR HCL 1 G PO TABS: 1000.0000 mg | ORAL_TABLET | Freq: Three times a day (TID) | ORAL | Status: AC

## 2014-05-13 MED ORDER — TETRACAINE HCL 0.5 % OP SOLN
OPHTHALMIC | Status: AC
  Filled 2014-05-13: qty 2

## 2014-05-13 NOTE — ED Notes (Signed)
Pt C/O a rash and swelling affecting upper right side frontal area, as well mild right eye irritation and discomfort. Denies pain. Onset yesterday.  Seen at an urgent care on [address] this morning, physician recommended that he be seen here. Unable to assess or treat the Pt appropriately at that facility. Pt alert, no signs of acute distress. Shirlean Kelly, CMA

## 2014-05-13 NOTE — Discharge Instructions (Signed)

## 2014-05-13 NOTE — ED Provider Notes (Signed)
CSN: [id number]     Arrival date & time 05/13/14  1138 History   First MD Initiated Contact with Patient 05/13/14 1233     Chief Complaint  Patient presents with  . Eye Problem   (Consider location/radiation/quality/duration/timing/severity/associated sxs/prior Treatment) HPI               73 year old male presents for evaluation of right eye irritation and a rash on his face. This started yesterday morning with slight redness in his forehead and a sensation of numbness behind his eyebrow. Since then his eyes started to feel irritated and red. It is not painful, but just mildly uncomfortable. His vision is not affected. He denies systemic symptoms. He went to a different urgent care, they told him to come here  Past Medical History  Diagnosis Date  . High cholesterol   . Hypertension    History reviewed. No pertinent past surgical history. No family history on file. History  Substance Use Topics  . Smoking status: Former Smoker -- 25 years    Quit date: 10/21/1981  . Smokeless tobacco: Not on file  . Alcohol Use: 10.0 oz/week    20 drink(s) per week     Comment: occ    Review of Systems  Eyes: Positive for pain, redness and itching. Negative for photophobia, discharge and visual disturbance.  Skin: Positive for rash.  All other systems reviewed and are negative.   Allergies  Dust mite extract and Adhesive  Home Medications   Prior to Admission medications   Medication Sig Start Date End Date Taking? Authorizing Provider  diphenhydrAMINE (BENADRYL) 25 MG tablet Take 25 mg by mouth every morning.   Yes Historical Provider, MD  predniSONE (DELTASONE) 20 MG tablet Take 60 mg by mouth daily with breakfast.   Yes Historical Provider, MD  rosuvastatin (CRESTOR) 10 MG tablet Take 7.5 mg by mouth every Monday, Wednesday, and Friday. In AM   Yes Historical Provider, MD  carvedilol (COREG) 12.5 MG tablet Take 1 tablet (12.5 mg total) by mouth 2 (two) times daily. 03/20/14   Mihai  Croitoru, MD  Chlorpheniramine Maleate (ALLERGY PO) Take 1 tablet by mouth every morning.    Historical Provider, MD  CHOLECALCIFEROL PO Take 600 mg by mouth every morning.    Historical Provider, MD  Multiple Vitamins-Minerals (MULTIVITAL) tablet Take 1 tablet by mouth daily.    Historical Provider, MD  telmisartan-hydrochlorothiazide (MICARDIS HCT) 40-12.5 MG per tablet Take 0.5 tablets by mouth daily. 03/20/14   Mihai Croitoru, MD  valACYclovir (VALTREX) 1000 MG tablet Take 1 tablet (1,000 mg total) by mouth 3 (three) times daily. 05/13/14 05/27/14  Freeman Caldron Lyniah Fujita, PA-C   BP 153/82 mmHg  Pulse 64  Temp(Src) 97.5 F (36.4 C) (Oral)  Resp 16  SpO2 96% Physical Exam  Constitutional: He is oriented to person, place, and time. He appears well-developed and well-nourished. No distress.  HENT:  Head: Normocephalic and atraumatic.    Right Ear: External ear normal.  Left Ear: External ear normal.  Nose: Nose normal.  Mouth/Throat: Oropharynx is clear and moist. No oropharyngeal exudate.  Eyes: EOM are normal. Pupils are equal, round, and reactive to light. Right conjunctiva is injected. Left conjunctiva is not injected.  Slit lamp exam:      The right eye shows no corneal abrasion, no corneal ulcer and no fluorescein uptake.  Pulmonary/Chest: No respiratory distress.  Neurological: He is alert and oriented to person, place, and time. Coordination normal.  Skin: Skin is warm and  dry. No rash noted. He is not diaphoretic.  Psychiatric: He has a normal mood and affect. Judgment normal.  Nursing note and vitals reviewed.   ED Course  Procedures (including critical care time) Labs Review Labs Reviewed - No data to display  Imaging Review No results found.   MDM   1. Zoster ophthalmicus    Concern for zoster ophthalmicus. I discussed this with the on-call ophthalmologist who recommends starting valacyclovir immediately and she will see this patient tomorrow at noon in her  office  Meds ordered this encounter  Medications  . predniSONE (DELTASONE) 20 MG tablet    Sig: Take 60 mg by mouth daily with breakfast.  . valACYclovir (VALTREX) 1000 MG tablet    Sig: Take 1 tablet (1,000 mg total) by mouth 3 (three) times daily.    Dispense:  21 tablet    Refill:  0    Order Specific Question:  Supervising Provider    Answer:  Jake Michaelis, DAVID C [[id number]]       Liam Graham, PA-C 05/13/14 1408

## 2014-06-05 ENCOUNTER — Ambulatory Visit: Payer: 59 | Admitting: Cardiovascular Disease

## 2014-06-08 ENCOUNTER — Encounter: Payer: Self-pay | Admitting: Cardiovascular Disease

## 2014-06-08 ENCOUNTER — Ambulatory Visit (INDEPENDENT_AMBULATORY_CARE_PROVIDER_SITE_OTHER): Payer: BC Managed Care – PPO | Admitting: Cardiovascular Disease

## 2014-06-08 VITALS — BP 131/88 | HR 80 | Resp 16 | Ht 68.0 in | Wt 198.3 lb

## 2014-06-08 DIAGNOSIS — R55 Syncope and collapse: Secondary | ICD-10-CM

## 2014-06-08 DIAGNOSIS — I10 Essential (primary) hypertension: Secondary | ICD-10-CM

## 2014-06-08 NOTE — Patient Instructions (Addendum)
Dr. Sallyanne Kuster recommends that you schedule a follow-up appointment in: AS NEEDED

## 2014-06-10 ENCOUNTER — Encounter: Payer: Self-pay | Admitting: Cardiovascular Disease

## 2014-06-10 NOTE — Progress Notes (Signed)
Patient ID: George Montoya, male   DOB: [birth year], 73 y.o.   MRN: [id number]     Reason for office visit Hypertension, history of syncope  This is Mr. Odwyer follow up visit after his echo. He has not had syncope since the last appointment and feels great. He has recovered from right sided herpes zoster ophthalmicus recently. His blood pressure today is good today and comparable to his home readings (although he is surprised - his BP is usually higher in the doctor's office).  In August, he had an episode of syncope that was likely a hypotensive episode associated with an acute diarrheal illness, dehydration and hyponatremia.  His echo was completely normal - no LVH seen. The "abnormal relaxation" pattern described is probably normal for his age group (medial e'=8.5).    Allergies  Allergen Reactions  . Dust Mite Extract   . Adhesive [Tape] Rash    Current Outpatient Prescriptions  Medication Sig Dispense Refill  . carvedilol (COREG) 12.5 MG tablet Take 1 tablet (12.5 mg total) by mouth 2 (two) times daily. 180 tablet 3  . Chlorpheniramine Maleate (ALLERGY PO) Take 1 tablet by mouth every morning.    . CHOLECALCIFEROL PO Take 600 mg by mouth every morning.    . diphenhydrAMINE (BENADRYL) 25 MG tablet Take 25 mg by mouth every morning.    Marland Kitchen GABAPENTIN PO Take by mouth 3 (three) times daily.    . Multiple Vitamins-Minerals (MULTIVITAL) tablet Take 1 tablet by mouth daily.    . predniSONE (DELTASONE) 20 MG tablet Take 60 mg by mouth daily with breakfast.    . rosuvastatin (CRESTOR) 10 MG tablet Take 7.5 mg by mouth every Monday, Wednesday, and Friday. In AM    . telmisartan-hydrochlorothiazide (MICARDIS HCT) 40-12.5 MG per tablet Take 0.5 tablets by mouth daily. 45 tablet 3   No current facility-administered medications for this visit.    Past Medical History  Diagnosis Date  . High cholesterol   . Hypertension     No past surgical history on file.  No family history on  file.  History   Social History  . Marital Status: Married    Spouse Name: N/A    Number of Children: N/A  . Years of Education: N/A   Occupational History  . Not on file.   Social History Main Topics  . Smoking status: Former Smoker -- 25 years    Quit date: 10/21/1981  . Smokeless tobacco: Not on file  . Alcohol Use: 10.0 oz/week    20 drink(s) per week     Comment: occ  . Drug Use: No  . Sexual Activity: Not on file   Other Topics Concern  . Not on file   Social History Narrative    Review of systems: The patient specifically denies any chest pain at rest or with exertion, dyspnea at rest or with exertion, orthopnea, paroxysmal nocturnal dyspnea, syncope, palpitations, focal neurological deficits, intermittent claudication, lower extremity edema, unexplained weight gain, cough, hemoptysis or wheezing.  The patient also denies abdominal pain, nausea, vomiting, dysphagia, diarrhea, constipation, polyuria, polydipsia, dysuria, hematuria, frequency, urgency, abnormal bleeding or bruising, fever, chills, unexpected weight changes, mood swings, change in skin or hair texture, change in voice quality, auditory or visual problems, allergic reactions or rashes, new musculoskeletal complaints other than usual "aches and pains".   PHYSICAL EXAM BP 131/88 mmHg  Pulse 80  Resp 16  Ht 5\' 8"  (1.727 m)  Wt 198 lb 4.8 oz (89.948 kg)  BMI  30.16 kg/m2  General: Alert, oriented x3, no distress Head: no evidence of trauma, one small crusted herpes zoster lesion in right frontal area,PERRL, EOMI, no exophtalmos or lid lag, no myxedema, no xanthelasma; normal ears, nose and oropharynx Neck: normal jugular venous pulsations and no hepatojugular reflux; brisk carotid pulses without delay and no carotid bruits Chest: clear to auscultation, no signs of consolidation by percussion or palpation, normal fremitus, symmetrical and full respiratory excursions Cardiovascular: normal position and  quality of the apical impulse, regular rhythm, normal first and second heart sounds, no murmurs, rubs or gallops Abdomen: no tenderness or distention, no masses by palpation, no abnormal pulsatility or arterial bruits, normal bowel sounds, no hepatosplenomegaly Extremities: no clubbing, cyanosis or edema; 2+ radial, ulnar and brachial pulses bilaterally; 2+ right femoral, posterior tibial and dorsalis pedis pulses; 2+ left femoral, posterior tibial and dorsalis pedis pulses; no subclavian or femoral bruits Neurological: grossly nonfocal  Lipid Panel  No results found for: CHOL, TRIG, HDL, CHOLHDL, VLDL, LDLCALC, LDLDIRECT  BMET    Component Value Date/Time   NA 127* 02/01/2014 1731   K 3.7 02/01/2014 1731   CL 88* 02/01/2014 1731   CO2 21 02/01/2014 1731   GLUCOSE 111* 02/01/2014 1731   BUN 12 02/01/2014 1731   CREATININE 0.82 02/01/2014 1731   CALCIUM 9.4 02/01/2014 1731   GFRNONAA 86* 02/01/2014 1731   GFRAA >90 02/01/2014 1731     ASSESSMENT AND PLAN  Mr. Coppa had an isolated syncopal event explained by an acute GI illness and hypovolemia. He is now asymptomatic. His BP is well controlled and he takes a potent statin for hypercholesterolemia. His echo shows no structural abnormalities. Weight loss is recommended. He will follow up as needed.  Patient Instructions  Dr. Sallyanne Kuster recommends that you schedule a follow-up appointment in: AS NEEDED      Meds ordered this encounter  Medications  . GABAPENTIN PO    Sig: Take by mouth 3 (three) times daily.    Holli Humbles, MD, [place] [phone] office [phone] pager

## 2014-08-31 ENCOUNTER — Other Ambulatory Visit (HOSPITAL_COMMUNITY): Payer: Self-pay | Admitting: Internal Medicine

## 2014-08-31 DIAGNOSIS — R918 Other nonspecific abnormal finding of lung field: Secondary | ICD-10-CM

## 2014-09-06 ENCOUNTER — Ambulatory Visit (HOSPITAL_COMMUNITY)
Admission: RE | Admit: 2014-09-06 | Discharge: 2014-09-06 | Disposition: A | Discharge status: Home / Self Care | Payer: BLUE CROSS/BLUE SHIELD | Source: Ambulatory Visit | Attending: Internal Medicine | Admitting: Internal Medicine

## 2014-09-06 DIAGNOSIS — R918 Other nonspecific abnormal finding of lung field: Secondary | ICD-10-CM | POA: Insufficient documentation

## 2014-09-06 LAB — GLUCOSE, CAPILLARY: Glucose-Capillary: 96 mg/dL (ref 70–99)

## 2014-09-14 ENCOUNTER — Encounter (HOSPITAL_COMMUNITY)
Admission: RE | Admit: 2014-09-14 | Discharge: 2014-09-14 | Disposition: A | Discharge status: Home / Self Care | Payer: BLUE CROSS/BLUE SHIELD | Source: Ambulatory Visit | Attending: Internal Medicine | Admitting: Internal Medicine

## 2014-09-14 DIAGNOSIS — R911 Solitary pulmonary nodule: Secondary | ICD-10-CM | POA: Insufficient documentation

## 2014-09-14 LAB — GLUCOSE, CAPILLARY: Glucose-Capillary: 79 mg/dL (ref 70–99)

## 2014-09-14 MED ORDER — FLUDEOXYGLUCOSE F - 18 (FDG) INJECTION
9.8000 | Freq: Once | INTRAVENOUS | Status: AC | PRN
  Administered 2014-09-14: 9.8 via INTRAVENOUS

## 2014-10-02 ENCOUNTER — Ambulatory Visit (INDEPENDENT_AMBULATORY_CARE_PROVIDER_SITE_OTHER): Payer: BLUE CROSS/BLUE SHIELD | Admitting: Internal Medicine

## 2014-10-02 ENCOUNTER — Encounter: Payer: Self-pay | Admitting: Internal Medicine

## 2014-10-02 VITALS — BP 152/90 | HR 70 | Ht 68.0 in | Wt 196.0 lb

## 2014-10-02 DIAGNOSIS — R911 Solitary pulmonary nodule: Secondary | ICD-10-CM | POA: Insufficient documentation

## 2014-10-02 DIAGNOSIS — Z87891 Personal history of nicotine dependence: Secondary | ICD-10-CM | POA: Diagnosis not present

## 2014-10-02 NOTE — Progress Notes (Signed)
Subjective:    Patient ID: George Montoya, male    DOB: 1940-11-06, 74 y.o.   MRN: [id number] PCP Donnajean Lopes, MD  HPI   IOV 10/02/2014  Chief Complaint  Patient presents with  . Pulmonary Consult    Pt referred by Dr. Sharlett Iles for lung mass.     74 year old male with melanoma resection in his right shoulder area 3 years ago in [facility] dermatology and in complete remission and clinical follow-up. Previous ex-50 pack smoking history quit in 1983. In February he went to [place] and did a lot of climbing. Then on 08/22/2014 he was in the mountains in [place] and suddenly woke up with left lower quadrant back pain that was acute and severe. This resulted in extensive investigation including CT scan of the abdomen and chest by his primary care physician. Diagnoses down to chronic low back issues and he is on opioids. Pain is improved steadily and for the last few days he's only been on NSAIDs. Investigation with a CT scan of the chest in March 2016 revealed a left upper lobe nodule. The CT scan of the chest is not available for me because it [facility]. He had a follow-up PET scan 09/14/2014 that shows a 1.7 cm lobulated left upper lobe nodule but without any PET scan uptake. He is here to discuss this. He is extremely nervous about possibility of cancer. Both his parents deceased from cancer. Father died at age 37 from prostate cancer untreated and mother died from breast cancer.  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 Very low probability of distant mets at this current point unless is different from site which is unlikely   has a past medical history of High cholesterol; Hypertension; Bowel obstruction; and Melanoma.   reports that he quit smoking about 32 years ago. His smoking use included Cigarettes. He has a 50 pack-year smoking history. He has never used smokeless tobacco.  Past Surgical History  Procedure Laterality Date  . Melanoma  excision    . Hernia repair    . Appendectomy      Allergies  Allergen Reactions  . Dust Mite Extract   . Adhesive [Tape] Rash    Immunization History  Administered Date(s) Administered  . Pneumococcal-Unspecified 06/23/2004    No family history on file.   Current outpatient prescriptions:  .  carvedilol (COREG) 12.5 MG tablet, Take 1 tablet (12.5 mg total) by mouth 2 (two) times daily. (Patient taking differently: Take 6.25 mg by mouth 2 (two) times daily. ), Disp: 180 tablet, Rfl: 3 .  Chlorpheniramine Maleate (ALLERGY PO), Take 1 tablet by mouth every morning., Disp: , Rfl:  .  CHOLECALCIFEROL PO, Take 600 mg by mouth every morning., Disp: , Rfl:  .  diphenhydrAMINE (BENADRYL) 25 MG tablet, Take 25 mg by mouth every morning., Disp: , Rfl:  .  Multiple Vitamins-Minerals (MULTIVITAL) tablet, Take 1 tablet by mouth daily., Disp: , Rfl:  .  rosuvastatin (CRESTOR) 10 MG tablet, Take 7.5 mg by mouth every Monday, Wednesday, and Friday. In AM, Disp: , Rfl:  .  telmisartan-hydrochlorothiazide (MICARDIS HCT) 40-12.5 MG per tablet, Take 0.5 tablets by mouth daily., Disp: 45 tablet, Rfl: 3 .  predniSONE (DELTASONE) 20 MG tablet, Take 60 mg by mouth daily with breakfast. 60mg  for 20 days for loss of hearing, Disp: , Rfl:      Review of Systems  Constitutional: Negative for fever and unexpected weight change.  HENT: Positive for nosebleeds. Negative for congestion, dental problem, ear pain, postnasal drip, rhinorrhea, sinus pressure, sneezing, sore throat and trouble swallowing.   Eyes: Negative for redness and itching.  Respiratory: Negative for cough, chest tightness, shortness of breath and wheezing.   Cardiovascular: Negative for palpitations and leg swelling.  Gastrointestinal: Negative for nausea and vomiting.  Genitourinary: Negative for dysuria.  Musculoskeletal: Negative for joint swelling.  Skin: Negative for rash.  Neurological: Negative for headaches.  Hematological: Does  not bruise/bleed easily.  Psychiatric/Behavioral: Negative for dysphoric mood. The patient is not nervous/anxious.        Objective:   Physical Exam  Constitutional: He is oriented to person, place, and time. He appears well-developed and well-nourished. No distress.  HENT:  Head: Normocephalic and atraumatic.  Right Ear: External ear normal.  Left Ear: External ear normal.  Mouth/Throat: Oropharynx is clear and moist. No oropharyngeal exudate.  Eyes: Conjunctivae and EOM are normal. Pupils are equal, round, and reactive to light. Right eye exhibits no discharge. Left eye exhibits no discharge. No scleral icterus.  Neck: Normal range of motion. Neck supple. No JVD present. No tracheal deviation present. No thyromegaly present.  Cardiovascular: Normal rate, regular rhythm and intact distal pulses.  Exam reveals no gallop and no friction rub.   No murmur heard. Pulmonary/Chest: Effort normal and breath sounds normal. No respiratory distress. He has no wheezes. He has no rales. He exhibits no tenderness.  Abdominal: Soft. Bowel sounds are normal. He exhibits no distension and no mass. There is no tenderness. There is no rebound and no guarding.  Visceral obesity  Musculoskeletal: Normal range of motion. He exhibits no edema or tenderness.  Lymphadenopathy:    He has no cervical adenopathy.  Neurological: He is alert and oriented to person, place, and time. He has normal reflexes. No cranial nerve deficit. Coordination normal.  Skin: Skin is warm and dry. No rash noted. He is not diaphoretic. No erythema. No pallor.  Nevus in back  Scar of melanoma resection rt shoulder area 3 years ago  Psychiatric: He has a normal mood and affect. His behavior is normal. Judgment and thought content normal.  Nursing note and vitals reviewed.   Filed Vitals:   10/02/14 0943  BP: 152/90  Pulse: 70  Height: 5\' 8"  (1.727 m)  Weight: 196 lb (88.905 kg)  SpO2: 98%         Assessment & Plan:      ICD-9-CM ICD-10-CM   1. Nodule of left lung 793.11 R91.1 Pulmonary Function Test  2. Stopped smoking with greater than 40 pack year history V15.82 Z87.891    My concern is lung cancer and metastatic melanoma nodule of the left upper lobe. The pretest probably ready for lung cancer was high based on age, sex, smoking history and size of the nodule but with a negative PET scan and this is dropped to low intermediate. However his melanoma history is recent and I'm wondering if this could be metastatic melanoma of the lung.  I think you'll benefit from electromagnetic navigational bronchoscopy and transbronchial biopsy. The risks of bleeding pneumothorax all low and limits of non-diagnosis were explained. The alternate course of serial CT scans imaging every few months was explained. He is willing to go with whatever I recommend. My bias is to subject him to bronchoscopy and transbronchial biopsy now because of the potential possibility of melanoma and lung cancer despite the negative PET scan. He might be a good candidate for percepta  registry  to adjust the pretest probability of lung nodule for lung cancer   Dr. Brand Males, M.D., [facility].C.P Pulmonary and Critical Care Medicine Staff Physician [facility] Pager: [phone], If no answer or between  15:00h - 7:00h: call [phone]  10/02/2014 10:29 AM

## 2014-10-02 NOTE — Patient Instructions (Signed)
ICD-9-CM ICD-10-CM   1. Nodule of left lung 793.11 R91.1   2. Stopped smoking with greater than 40 pack year history V15.82 Z87.891     Do full PFT any location next few weeks WIll talk to your dermatologist Dr Wilhemina Bonito and Dr Baltazar Apo and get back to you best course of action

## 2014-10-03 ENCOUNTER — Other Ambulatory Visit: Payer: Self-pay | Admitting: Emergency Medicine

## 2014-10-03 ENCOUNTER — Telehealth: Payer: Self-pay | Admitting: Internal Medicine

## 2014-10-03 DIAGNOSIS — R911 Solitary pulmonary nodule: Secondary | ICD-10-CM

## 2014-10-03 NOTE — Telephone Encounter (Signed)
I have placed order to schedule an ENB since the patient prefers this to primary resection.

## 2014-10-03 NOTE — Telephone Encounter (Signed)
ROb  This is the patient George Montoya I talked about LUL nodule, PET negative. Ex-smoker, good functional capacity and hx of melanoma in situ 3 years ago  THanks  Dr. Brand Males, M.D., [facility].C.P Pulmonary and Critical Care Medicine Staff Physician [facility] Pager: [phone], If no answer or between  15:00h - 7:00h: call [phone]  10/03/2014 8:27 AM

## 2014-10-04 ENCOUNTER — Institutional Professional Consult (permissible substitution) (INDEPENDENT_AMBULATORY_CARE_PROVIDER_SITE_OTHER): Payer: BLUE CROSS/BLUE SHIELD | Admitting: Surgery

## 2014-10-04 ENCOUNTER — Encounter: Payer: Self-pay | Admitting: Surgery

## 2014-10-04 VITALS — BP 190/100 | HR 67 | Resp 20 | Ht 68.0 in | Wt 196.0 lb

## 2014-10-04 DIAGNOSIS — I712 Thoracic aortic aneurysm, without rupture: Secondary | ICD-10-CM

## 2014-10-04 DIAGNOSIS — I7121 Aneurysm of the ascending aorta, without rupture: Secondary | ICD-10-CM

## 2014-10-07 ENCOUNTER — Encounter: Payer: Self-pay | Admitting: Surgery

## 2014-10-07 NOTE — Progress Notes (Signed)
Cardiothoracic Surgery Consultation   PCP is Donnajean Lopes, MD Referring Provider is Leanna Battles, MD  Chief Complaint  Patient presents with  . Thoracic Aortic Aneurysm    Surgical eval on ascending aortic aneurysm seen on PET Scan from 09/14/14    HPI:  The patient is a 74 year old gentleman with a 11 pack-year smoking history until he quit in 1983, melanoma-in-situ resection from the right shoulder 3 years ago who woke up with severe left lower back pain on 08/22/2014 while in the [place] of [place]. He thought he had a kidney stone. He had a CT scan of the abdomen and chest which showed a 1.7 cm nodule in the left upper lobe of the lung and an incidental 4.1 cm fusiform ascending aortic aneurysm. There was no kidney stone and his pain resolved with pain medication and was felt to be musculoskeletal. He had a PET scan on 09/14/2014 that showed a 1.7 cm lobulated LUL nodule that was weakly hypermetabolic with an SUV max of 1.9. There were no hypermetabolic or enlarged mediastinal or hilar lymph nodes and no other abnormality except the ascending aortic aneurysm. He was seen by Dr. Chase Caller and has been scheduled for an ENB by Dr. Lamonte Sakai to biopsy the LUL nodule. The patient's mother died of lung cancer and his father died of prostate cancer.  Past Medical History  Diagnosis Date  . High cholesterol   . Hypertension   . Bowel obstruction   . Melanoma     Past Surgical History  Procedure Laterality Date  . Melanoma excision    . Hernia repair    . Appendectomy      History reviewed. No pertinent family history.  Social History History  Substance Use Topics  . Smoking status: Former Smoker -- 2.00 packs/day for 25 years    Types: Cigarettes    Quit date: 10/21/1981  . Smokeless tobacco: Never Used  . Alcohol Use: 12.0 oz/week    20 Standard drinks or equivalent per week     Comment: occ    Current Outpatient Prescriptions  Medication Sig Dispense Refill    . carvedilol (COREG) 12.5 MG tablet Take 1 tablet (12.5 mg total) by mouth 2 (two) times daily. (Patient taking differently: Take 6.25 mg by mouth 2 (two) times daily. ) 180 tablet 3  . Chlorpheniramine Maleate (ALLERGY PO) Take 1 tablet by mouth every morning.    . CHOLECALCIFEROL PO Take 600 mg by mouth every morning.    . diphenhydrAMINE (BENADRYL) 25 MG tablet Take 25 mg by mouth every morning.    . Multiple Vitamins-Minerals (MULTIVITAL) tablet Take 1 tablet by mouth daily.    . predniSONE (DELTASONE) 20 MG tablet Take 60 mg by mouth daily with breakfast. 60mg  for 20 days for loss of hearing    . rosuvastatin (CRESTOR) 10 MG tablet Take 7.5 mg by mouth every Monday, Wednesday, and Friday. In AM    . telmisartan-hydrochlorothiazide (MICARDIS HCT) 40-12.5 MG per tablet Take 0.5 tablets by mouth daily. 45 tablet 3   No current facility-administered medications for this visit.    Allergies  Allergen Reactions  . Dust Mite Extract   . Adhesive [Tape] Rash    Review of Systems  Constitutional: Negative.   HENT: Positive for hearing loss.   Eyes: Negative.   Respiratory: Negative.   Cardiovascular: Negative.   Gastrointestinal: Negative.   Endocrine: Negative.   Genitourinary: Negative.   Musculoskeletal: Negative.   Skin: Negative.  Allergic/Immunologic: Negative.   Neurological: Negative.   Hematological: Negative.   Psychiatric/Behavioral: Negative.     BP 190/100 mmHg  Pulse 67  Resp 20  Ht 5\' 8"  (1.727 m)  Wt 196 lb (88.905 kg)  BMI 29.81 kg/m2  SpO2 98% Physical Exam  Constitutional: He is oriented to person, place, and time. He appears well-developed and well-nourished. No distress.  HENT:  Head: Normocephalic and atraumatic.  Mouth/Throat: Oropharynx is clear and moist.  Eyes: EOM are normal. Pupils are equal, round, and reactive to light.  Neck: Normal range of motion. Neck supple. No JVD present. No thyromegaly present.  Cardiovascular: Normal rate, regular  rhythm, normal heart sounds and intact distal pulses.   No murmur heard. Pulmonary/Chest: Effort normal and breath sounds normal. No respiratory distress. He has no wheezes. He exhibits no tenderness.  Abdominal: Soft. Bowel sounds are normal. He exhibits no distension and no mass. There is no tenderness.  Musculoskeletal: Normal range of motion. He exhibits no edema.  Lymphadenopathy:    He has no cervical adenopathy.  Neurological: He is alert and oriented to person, place, and time. He has normal strength. No cranial nerve deficit or sensory deficit.  Skin: Skin is warm and dry.  Psychiatric: He has a normal mood and affect.     Diagnostic Tests:  I do not have access to the CT scan of the chest and abdomen that were done at an outside facility.    CLINICAL DATA: Initial treatment strategy for pulmonary nodule.  EXAM: NUCLEAR MEDICINE PET SKULL BASE TO THIGH  TECHNIQUE: 9.8 mCi F-18 FDG was injected intravenously. Full-ring PET imaging was performed from the skull base to thigh after the radiotracer. CT data was obtained and used for attenuation correction and anatomic localization.  FASTING BLOOD GLUCOSE: Value: 79 mg/dl  COMPARISON: None.  FINDINGS: NECK  No hypermetabolic lymph nodes in the neck.  CHEST  17 x 15 mm lobulated left upper lobe lung lesion is weakly hypermetabolic with SUV max of 1.9. This is not considered neoplastic range uptake. However, a slow growing low grade adenocarcinoma is possible. Recommend correlation with prior imaging studies and either close CT followup or biopsy. No hypermetabolic or enlarged mediastinal or hilar lymph nodes. No other pulmonary nodules. Additional findings include tortuosity, ectasia and calcification of the ascending aorta with aneurysmal dilatation with maximal measurements of 4.1 x 4.0 cm. Coronary artery calcifications.  ABDOMEN/PELVIS  No abnormal hypermetabolic activity within the liver,  pancreas, adrenal glands, or spleen. No hypermetabolic lymph nodes in the abdomen or pelvis.  SKELETON  No focal hypermetabolic activity to suggest skeletal metastasis.  IMPRESSION: Solitary left upper lobe pulmonary nodule measuring approximately 17 x 15 mm is weakly hypermetabolic with SUV max of 1.9. Recommend correlation with any prior imaging studies. Close CT follow-up versus image guided biopsy.  No mediastinal or hilar adenopathy and no findings for metastatic disease.  Three vessel coronary artery calcifications  Aneurysmal dilatation of the ascending thoracic aorta. Recommend semi-annual imaging followup by CTA or MRA and referral to cardiothoracic surgery if not already obtained. This recommendation follows 2010 ACCF/AHA/AATS/ACR/ASA/SCA/SCAI/SIR/STS/SVM Guidelines for the Diagnosis and Management of Patients With Thoracic Aortic Disease. Circulation. 2010; 121: K270-W23   Electronically Signed  By: Marijo Sanes M.D.  On: 09/14/2014 08:52   Impression:  1. He has a fusiform ascending aortic aneurysm that is 4.1 cm in maximum diameter and of unknown duration. This does not require treatment but does require follow up with another CTA in 1 year.  The general criteria for recommending surgical treatment is a diameter of 5.5 cm or a period of rapid enlargement of 5 mm over a one year period. Blood pressure control is very important for minimizing the risk of enlargement and aortic dissection that can occur even at its current size with severe high blood pressure. His BP is 190/100 today and he feels that it is due to coming to the doctor but this will require close follow up. He is on a beta blocker and ARB, diuretic.  2. LUL lung nodule is concerning. It has low grade hypermetabolic activity and could be a benign inflammatory nodule or a slow growing adenocarcinoma. He is in a high risk group for lung cancer. His melanoma was an early in-situ lesion and I would  think the risk of a metastatic lesion from this is extremely low. He is scheduled for an ENB but the lesion is small and even if the biopsy is indeterminate or atypical it will require close followup with repeat CT scanning in 3-4 months to see if it is increasing in size. It could be resected but would require a lobectomy, assuming his pulmonary function is adequate.    Plan:  He will have an ENB by Dr. Lamonte Sakai. I will be happy to help with his treatment if surgical resection is needed.  I will see him in one year with a CTA of the chest to follow up on the aortic aneurysm.  He will follow up with Dr. Philip Aspen for his hypertension.  Gaye Pollack, MD [facility] [phone]

## 2014-10-09 ENCOUNTER — Telehealth: Payer: Self-pay | Admitting: Internal Medicine

## 2014-10-09 DIAGNOSIS — R911 Solitary pulmonary nodule: Secondary | ICD-10-CM

## 2014-10-09 NOTE — Telephone Encounter (Signed)
lmtcb for pt.  

## 2014-10-09 NOTE — Telephone Encounter (Signed)
George Montoya   HE is having ENB 10/18/14. Please have hiim do a SUPER -D CT welll before that and Discu to go to Dr Lamonte Sakai. Keep me posted  THnaks  Dr. Brand Males, M.D., [facility].C.P Pulmonary and Critical Care Medicine Staff Physician [facility] Pager: [phone], If no answer or between  15:00h - 7:00h: call [phone]  10/09/2014 8:49 AM

## 2014-10-09 NOTE — Telephone Encounter (Signed)
Pt returned call - [phone]

## 2014-10-09 NOTE — Telephone Encounter (Signed)
Pt is aware that we will need to do super d CT. Order will be placed. Nothing further was needed.

## 2014-10-13 ENCOUNTER — Telehealth: Payer: Self-pay | Admitting: Internal Medicine

## 2014-10-13 ENCOUNTER — Encounter (HOSPITAL_COMMUNITY): Payer: Self-pay

## 2014-10-13 ENCOUNTER — Encounter (HOSPITAL_COMMUNITY)
Admission: RE | Admit: 2014-10-13 | Discharge: 2014-10-13 | Disposition: A | Discharge status: Home / Self Care | Payer: BLUE CROSS/BLUE SHIELD | Source: Ambulatory Visit | Attending: Emergency Medicine | Admitting: Emergency Medicine

## 2014-10-13 ENCOUNTER — Inpatient Hospital Stay: Admission: RE | Admit: 2014-10-13 | Payer: BLUE CROSS/BLUE SHIELD | Source: Ambulatory Visit

## 2014-10-13 DIAGNOSIS — Z01818 Encounter for other preprocedural examination: Secondary | ICD-10-CM | POA: Insufficient documentation

## 2014-10-13 DIAGNOSIS — R911 Solitary pulmonary nodule: Secondary | ICD-10-CM | POA: Insufficient documentation

## 2014-10-13 HISTORY — DX: Reserved for inherently not codable concepts without codable children: IMO0001

## 2014-10-13 HISTORY — DX: Aortic aneurysm of unspecified site, without rupture: I71.9

## 2014-10-13 HISTORY — DX: Gastro-esophageal reflux disease without esophagitis: K21.9

## 2014-10-13 HISTORY — DX: Scoliosis, unspecified: M41.9

## 2014-10-13 HISTORY — DX: Zoster without complications: B02.9

## 2014-10-13 HISTORY — DX: Meniere's disease, left ear: H81.02

## 2014-10-13 LAB — COMPREHENSIVE METABOLIC PANEL
ALK PHOS: 42 U/L (ref 39–117)
ALT: 66 U/L — ABNORMAL HIGH (ref 0–53)
ANION GAP: 13 (ref 5–15)
AST: 67 U/L — AB (ref 0–37)
Albumin: 4.5 g/dL (ref 3.5–5.2)
BUN: 6 mg/dL (ref 6–23)
CO2: 24 mmol/L (ref 19–32)
Calcium: 9.3 mg/dL (ref 8.4–10.5)
Chloride: 89 mmol/L — ABNORMAL LOW (ref 96–112)
Creatinine, Ser: 0.77 mg/dL (ref 0.50–1.35)
GFR calc Af Amer: >90 mL/min (ref >90)
GFR calc non Af Amer: 88 mL/min — ABNORMAL LOW (ref >90)
Glucose, Bld: 100 mg/dL — ABNORMAL HIGH (ref 70–99)
POTASSIUM: 4 mmol/L (ref 3.5–5.1)
Sodium: 126 mmol/L — ABNORMAL LOW (ref 135–145)
TOTAL PROTEIN: 7 g/dL (ref 6.0–8.3)
Total Bilirubin: 0.8 mg/dL (ref 0.3–1.2)

## 2014-10-13 LAB — PROTIME-INR
INR: 1.03 (ref 0.00–1.49)
Prothrombin Time: 13.6 seconds (ref 11.6–15.2)

## 2014-10-13 LAB — CBC
HEMATOCRIT: 38.7 % — AB (ref 39.0–52.0)
Hemoglobin: 13.7 g/dL (ref 13.0–17.0)
MCH: 34.3 pg — ABNORMAL HIGH (ref 26.0–34.0)
MCHC: 35.4 g/dL (ref 30.0–36.0)
MCV: 97 fL (ref 78.0–100.0)
PLATELETS: 189 10*3/uL (ref 150–400)
RBC: 3.99 MIL/uL — ABNORMAL LOW (ref 4.22–5.81)
RDW: 11.8 % (ref 11.5–15.5)
WBC: 5 10*3/uL (ref 4.0–10.5)

## 2014-10-13 LAB — APTT: aPTT: 31 seconds (ref 24–37)

## 2014-10-13 NOTE — Telephone Encounter (Signed)
Called [facility] and spoke to [place]. Pt had to have CT rescheduled to Monday 4/25 as CT has not been approved yet. Pt has bronch scheduled for 4/27. Peer to peer is needed.  MR please advise if this peer to peer can be done before Monday 4/25 so pt's CT can be done prior to bronch on 4/27. Ask for medical director for peer to peer at [phone]. Pt's ID: [id number]

## 2014-10-13 NOTE — Progress Notes (Signed)
   10/13/14 1023  OBSTRUCTIVE SLEEP APNEA  Have you ever been diagnosed with sleep apnea through a sleep study? No  Do you snore loudly (loud enough to be heard through closed doors)?  1  Do you often feel tired, fatigued, or sleepy during the daytime? 0  Has anyone observed you stop breathing during your sleep? 0  Do you have, or are you being treated for high blood pressure? 1  BMI more than 35 kg/m2? 0  Age over 73 years old? 1  Neck circumference greater than 40 cm/16 inches? 1  Gender: 1

## 2014-10-16 ENCOUNTER — Telehealth: Payer: Self-pay | Admitting: Emergency Medicine

## 2014-10-16 ENCOUNTER — Ambulatory Visit (INDEPENDENT_AMBULATORY_CARE_PROVIDER_SITE_OTHER)
Admission: RE | Admit: 2014-10-16 | Discharge: 2014-10-16 | Disposition: A | Discharge status: Home / Self Care | Payer: BLUE CROSS/BLUE SHIELD | Source: Ambulatory Visit | Attending: Internal Medicine | Admitting: Internal Medicine

## 2014-10-16 ENCOUNTER — Encounter (HOSPITAL_COMMUNITY): Payer: Self-pay

## 2014-10-16 DIAGNOSIS — R911 Solitary pulmonary nodule: Secondary | ICD-10-CM

## 2014-10-16 MED ORDER — FUROSEMIDE 40 MG PO TABS: 40.0000 mg | ORAL_TABLET | Freq: Every day | ORAL | Status: DC

## 2014-10-16 NOTE — Telephone Encounter (Signed)
Spoke with Ebony Hail PA with [facility] States that patient came in for Pre-admit for bronch on 4/27 Labs checked and resulted low sodium of 126.  Bryson Ha states that from a Anesthesiologist standpoint, they like for these to be closer to 130's (normal range 135-145) Pt is taking a diuretic, Micardis-HCT 40-12.'5mg'$  - normally recommend stopping this a few days prior to surgery but with the patient having continuous high BP readings while on Micardis, its probably not safe to stop this. Other option is fluid restriction.  Please advise Dr Lamonte Sakai. Thanks.

## 2014-10-16 NOTE — Telephone Encounter (Signed)
Daneil Dan do you have another number for peer to peer? Thanks.

## 2014-10-16 NOTE — Telephone Encounter (Signed)
Could not get anyone at the hospital - Anest. Dept.  I called and gave patient restrictions.  Called in Lasix for patient.  Will need to contact Anest. Dept tomorrow to advise them of patient's restrictions and to recheck preop.

## 2014-10-16 NOTE — Telephone Encounter (Signed)
[facility] CT scheduled for 10/16/2014. I cannot get the peer to pper. I called Friday but thought they were shut but the phone number you have given is wrong.  The CT is at 11am today. Get me the right number STAT

## 2014-10-16 NOTE — Telephone Encounter (Signed)
Informed [facility] of the authorization number. Nothing further needed at this time.

## 2014-10-16 NOTE — Progress Notes (Addendum)
Anesthesia Chart Review:  Patient is a 74 year old male scheduled for bronchoscopy with electromagnetic navigation and biopsies on 10/18/14 by Dr. Lamonte Sakai.  History includes former smoker, HTN, melanoma in-situ resection from right shoulder ~ 2012, 4.1 cm fusiform ascending aortic aneursym, scoliosis, hypercholesterolemia, Meniere's disease (left), hernia repair, bowel obstruction treated with what sounds like lysis of adhesions. OSA screening was elevated at 5. PCP is Dr. Leanna Battles. Cardiologist is Dr. Sallyanne Kuster (seen for syncope '15 in the setting of acute GI illness with hypovolemia; PRN cardiology follow-up recommended following echo results). Pulmonologist is Dr. Chase Caller. CT surgeon is Dr. Cyndia Bent who recommended follow-up chest CTA in 1 one year.   Meds include Calcium, Coreg, Zyrtec, Vitamin D-C, MVI, Benadryl, prednisone, Crestor, Micardis HCT.  03/20/14 EKG: NSR.  03/29/14 Echo: - Left ventricle: The cavity size was normal. Wall thickness was normal. Systolic function was vigorous. The estimated ejection fraction was in the range of 65% to 70%. Wall motion was normal; there were no regional wall motion abnormalities. Doppler parameters are consistent with abnormal left ventricular relaxation (grade 1 diastolic dysfunction). The E/e/ ratio is <8, suggesting normal LV filling pressure. - Aortic valve: Structurally normal valve. Trileaflet. There was trivial regurgitation. - Mitral valve: Mildly thickened leaflets . There was trivial regurgitation. - Left atrium: LA Volume/BSA= 26.4 ml/m2. The atrium was normal in size. Impressions: LVEF 65-70%, normal wall thickness, normal chamber sizes,diastolic dysfunction, normal LV filling pressure. Trivial AI andMR.  10/16/14 CT Super D Chest CT: pending.  Pre-operative labs noted. Na 126, Cl 89 (previously Na 127, Cl 89 on 02/01/14). Cr 0.77. AST/ALT 67/66 (no recent comparison LFTs noted). H/H 13.7/38.7, PLT 189. PT/PTT WNL. LFTs are not quite > 2  times above normal and since PLT count and PT/PTT are WNL, I will not plan to repeat pre-operatively.  His has hyponatremia dating back to at least 01/2014 this was also following an episode of diarrhea.  He is on diuretic therapy and has a lung mass which both could be playing a role.  I called low sodium result to nurse Ashtyn stating that I would plan to have Na repeated on the day of surgery and if result was not increasing then it was possible his anesthesiologist may not feel comfortable proceeding. His diuretic combo pill is his only anti-hypertensive, and his BP was not well controlled at his latest TCTS visit, so may not be able to stop it without prescribing another anti-hypertensive. Could consider a short term fluid restriction but would defer to Dr. Lamonte Sakai and/or Dr. Philip Aspen. She will review with Dr. Lamonte Sakai for additional instructions.    George Montoya [facility]/Anesthesiology Phone [phone] 10/16/2014 11:24 AM  Addendum: Received a message from [facility].  Dr. Melvyn Novas reviewed labs in the absence of Dr. Lamonte Sakai.  He recommended patient avoid excess water, hold Micardis and start Lasix, double Coreg dose, use hard candy PRN dry mouth, recheck labs pre-operatively. Patient has been given instructions. I instructed patient to not use hard candy while he is NPO.  He took Lasix this morning. I also told him that anesthesia guidelines are to hold diuretic on the morning of surgery. He does report that Dr. Philip Aspen has told him his sodium tends to run low, but has not given him any specific instructions other than to monitor periodically. He had questions about the duration of his new medication regimen and when he might know biopsy results.  I advised him to discuss further with Dr.  Byrum on the day of his procedure.  He is for STAT BMET on arrival tomorrow.  George Montoya [facility] Short Stay Center/Anesthesiology Phone [phone] 10/17/2014 12:43 PM

## 2014-10-16 NOTE — Telephone Encounter (Signed)
Number is: [phone]

## 2014-10-16 NOTE — Telephone Encounter (Signed)
Authorization apparently done 10/09/14 throug 11/07/14. They said it was already done and no need for me to have called. Auth# [id number]. It can be done at [employer] [address]

## 2014-10-16 NOTE — Telephone Encounter (Signed)
Please advise Dr Melvyn Novas as Dr Lamonte Sakai is not available. Thanks.

## 2014-10-16 NOTE — Telephone Encounter (Signed)
Stop micardis today and double coreg dose and  start lasix 40 mg now and daily and recheck preop, avoid excess water and just use hard rock candy like lifesavers or jolly ranchers if mouth dry

## 2014-10-17 NOTE — Telephone Encounter (Signed)
[facility] and spoke with [facility] in the Anesthesia dept. She took note of recs MW gave for patient prior to [place]. She will make sure Ebony Hail is aware of this as well and if any further questions she will contact me directly at the office number.

## 2014-10-18 ENCOUNTER — Ambulatory Visit (HOSPITAL_COMMUNITY): Payer: BLUE CROSS/BLUE SHIELD | Admitting: Anesthesiology

## 2014-10-18 ENCOUNTER — Ambulatory Visit (HOSPITAL_COMMUNITY)
Admission: RE | Admit: 2014-10-18 | Discharge: 2014-10-18 | Disposition: A | Discharge status: Home / Self Care | Payer: BLUE CROSS/BLUE SHIELD | Source: Ambulatory Visit | Attending: Emergency Medicine | Admitting: Emergency Medicine

## 2014-10-18 ENCOUNTER — Encounter (HOSPITAL_COMMUNITY)
Admission: RE | Disposition: A | Discharge status: Home / Self Care | Payer: Self-pay | Source: Ambulatory Visit | Attending: Emergency Medicine

## 2014-10-18 ENCOUNTER — Encounter (HOSPITAL_COMMUNITY): Payer: Self-pay

## 2014-10-18 ENCOUNTER — Ambulatory Visit (HOSPITAL_COMMUNITY): Payer: BLUE CROSS/BLUE SHIELD

## 2014-10-18 ENCOUNTER — Ambulatory Visit (HOSPITAL_COMMUNITY): Payer: BLUE CROSS/BLUE SHIELD | Admitting: Vascular Surgery

## 2014-10-18 DIAGNOSIS — J8410 Pulmonary fibrosis, unspecified: Secondary | ICD-10-CM | POA: Diagnosis not present

## 2014-10-18 DIAGNOSIS — Z888 Allergy status to other drugs, medicaments and biological substances status: Secondary | ICD-10-CM | POA: Insufficient documentation

## 2014-10-18 DIAGNOSIS — I252 Old myocardial infarction: Secondary | ICD-10-CM | POA: Diagnosis not present

## 2014-10-18 DIAGNOSIS — Z85820 Personal history of malignant melanoma of skin: Secondary | ICD-10-CM | POA: Diagnosis not present

## 2014-10-18 DIAGNOSIS — Z419 Encounter for procedure for purposes other than remedying health state, unspecified: Secondary | ICD-10-CM

## 2014-10-18 DIAGNOSIS — Z87891 Personal history of nicotine dependence: Secondary | ICD-10-CM | POA: Insufficient documentation

## 2014-10-18 DIAGNOSIS — I739 Peripheral vascular disease, unspecified: Secondary | ICD-10-CM | POA: Diagnosis not present

## 2014-10-18 DIAGNOSIS — E780 Pure hypercholesterolemia: Secondary | ICD-10-CM | POA: Diagnosis not present

## 2014-10-18 DIAGNOSIS — R911 Solitary pulmonary nodule: Secondary | ICD-10-CM | POA: Insufficient documentation

## 2014-10-18 DIAGNOSIS — K219 Gastro-esophageal reflux disease without esophagitis: Secondary | ICD-10-CM | POA: Insufficient documentation

## 2014-10-18 DIAGNOSIS — I10 Essential (primary) hypertension: Secondary | ICD-10-CM | POA: Insufficient documentation

## 2014-10-18 DIAGNOSIS — Z9889 Other specified postprocedural states: Secondary | ICD-10-CM

## 2014-10-18 DIAGNOSIS — I509 Heart failure, unspecified: Secondary | ICD-10-CM | POA: Diagnosis not present

## 2014-10-18 DIAGNOSIS — R918 Other nonspecific abnormal finding of lung field: Secondary | ICD-10-CM | POA: Diagnosis present

## 2014-10-18 HISTORY — PX: VIDEO BRONCHOSCOPY WITH ENDOBRONCHIAL NAVIGATION: SHX6175

## 2014-10-18 LAB — BASIC METABOLIC PANEL
Anion gap: 13 (ref 5–15)
BUN: 8 mg/dL (ref 6–23)
CALCIUM: 9.3 mg/dL (ref 8.4–10.5)
CO2: 22 mmol/L (ref 19–32)
CREATININE: 0.87 mg/dL (ref 0.50–1.35)
Chloride: 94 mmol/L — ABNORMAL LOW (ref 96–112)
GFR calc Af Amer: >90 mL/min (ref >90)
GFR calc non Af Amer: 84 mL/min — ABNORMAL LOW (ref >90)
Glucose, Bld: 90 mg/dL (ref 70–99)
Potassium: 3.6 mmol/L (ref 3.5–5.1)
Sodium: 129 mmol/L — ABNORMAL LOW (ref 135–145)

## 2014-10-18 SURGERY — VIDEO BRONCHOSCOPY WITH ENDOBRONCHIAL NAVIGATION
Anesthesia: General | Site: Bronchus

## 2014-10-18 MED ORDER — NEOSTIGMINE METHYLSULFATE 10 MG/10ML IV SOLN
INTRAVENOUS | Status: AC
  Filled 2014-10-18: qty 1

## 2014-10-18 MED ORDER — MIDAZOLAM HCL 5 MG/5ML IJ SOLN
INTRAMUSCULAR | Status: DC | PRN
  Administered 2014-10-18: 2 mg via INTRAVENOUS

## 2014-10-18 MED ORDER — PROPOFOL 10 MG/ML IV BOLUS
INTRAVENOUS | Status: AC
  Filled 2014-10-18: qty 20

## 2014-10-18 MED ORDER — SUCCINYLCHOLINE CHLORIDE 20 MG/ML IJ SOLN
INTRAMUSCULAR | Status: AC
  Filled 2014-10-18: qty 1

## 2014-10-18 MED ORDER — NEOSTIGMINE METHYLSULFATE 10 MG/10ML IV SOLN
INTRAVENOUS | Status: DC | PRN
  Administered 2014-10-18: 4 mg via INTRAVENOUS

## 2014-10-18 MED ORDER — 0.9 % SODIUM CHLORIDE (POUR BTL) OPTIME
TOPICAL | Status: DC | PRN
  Administered 2014-10-18: 1000 mL

## 2014-10-18 MED ORDER — VECURONIUM BROMIDE 10 MG IV SOLR
INTRAVENOUS | Status: AC
  Filled 2014-10-18: qty 10

## 2014-10-18 MED ORDER — ROCURONIUM BROMIDE 50 MG/5ML IV SOLN
INTRAVENOUS | Status: AC
  Filled 2014-10-18: qty 1

## 2014-10-18 MED ORDER — MIDAZOLAM HCL 2 MG/2ML IJ SOLN
INTRAMUSCULAR | Status: AC
  Filled 2014-10-18: qty 2

## 2014-10-18 MED ORDER — STERILE WATER FOR INJECTION IJ SOLN
INTRAMUSCULAR | Status: AC
  Filled 2014-10-18: qty 10

## 2014-10-18 MED ORDER — GLYCOPYRROLATE 0.2 MG/ML IJ SOLN
INTRAMUSCULAR | Status: AC
  Filled 2014-10-18: qty 3

## 2014-10-18 MED ORDER — OXYCODONE HCL 5 MG PO TABS: 5.0000 mg | ORAL_TABLET | Freq: Once | ORAL | Status: DC | PRN

## 2014-10-18 MED ORDER — FENTANYL CITRATE (PF) 250 MCG/5ML IJ SOLN
INTRAMUSCULAR | Status: AC
  Filled 2014-10-18: qty 5

## 2014-10-18 MED ORDER — CARVEDILOL 12.5 MG PO TABS: 12.5000 mg | ORAL_TABLET | Freq: Two times a day (BID) | ORAL | Status: DC

## 2014-10-18 MED ORDER — EPHEDRINE SULFATE 50 MG/ML IJ SOLN
INTRAMUSCULAR | Status: AC
  Filled 2014-10-18: qty 1

## 2014-10-18 MED ORDER — PHENYLEPHRINE HCL 10 MG/ML IJ SOLN
10.0000 mg | INTRAVENOUS | Status: DC | PRN
  Administered 2014-10-18: 25 ug/min via INTRAVENOUS

## 2014-10-18 MED ORDER — PROPOFOL 10 MG/ML IV BOLUS
INTRAVENOUS | Status: DC | PRN
  Administered 2014-10-18: 60 mg via INTRAVENOUS
  Administered 2014-10-18: 140 mg via INTRAVENOUS

## 2014-10-18 MED ORDER — LIDOCAINE HCL (CARDIAC) 20 MG/ML IV SOLN
INTRAVENOUS | Status: DC | PRN
  Administered 2014-10-18: 60 mg via INTRAVENOUS

## 2014-10-18 MED ORDER — MIDAZOLAM HCL 2 MG/2ML IJ SOLN
INTRAMUSCULAR | Status: AC
Start: 2014-10-18 — End: 2014-10-18
  Filled 2014-10-18: qty 2

## 2014-10-18 MED ORDER — LIDOCAINE HCL (CARDIAC) 20 MG/ML IV SOLN
INTRAVENOUS | Status: AC
  Filled 2014-10-18: qty 20

## 2014-10-18 MED ORDER — GLYCOPYRROLATE 0.2 MG/ML IJ SOLN
INTRAMUSCULAR | Status: AC
  Filled 2014-10-18: qty 2

## 2014-10-18 MED ORDER — LIDOCAINE HCL 4 % MT SOLN
OROMUCOSAL | Status: DC | PRN
  Administered 2014-10-18: 4 mL via TOPICAL

## 2014-10-18 MED ORDER — FENTANYL CITRATE (PF) 250 MCG/5ML IJ SOLN
INTRAMUSCULAR | Status: DC | PRN
  Administered 2014-10-18 (×3): 50 ug via INTRAVENOUS

## 2014-10-18 MED ORDER — OXYCODONE HCL 5 MG/5ML PO SOLN: 5.0000 mg | Freq: Once | ORAL | Status: DC | PRN

## 2014-10-18 MED ORDER — ROCURONIUM BROMIDE 100 MG/10ML IV SOLN
INTRAVENOUS | Status: DC | PRN
  Administered 2014-10-18: 40 mg via INTRAVENOUS
  Administered 2014-10-18: 10 mg via INTRAVENOUS

## 2014-10-18 MED ORDER — LACTATED RINGERS IV SOLN
INTRAVENOUS | Status: DC | PRN
  Administered 2014-10-18: 08:00:00 via INTRAVENOUS

## 2014-10-18 MED ORDER — GLYCOPYRROLATE 0.2 MG/ML IJ SOLN
INTRAMUSCULAR | Status: DC | PRN
  Administered 2014-10-18: .1 mg via INTRAVENOUS
  Administered 2014-10-18: 0.6 mg via INTRAVENOUS

## 2014-10-18 MED ORDER — ONDANSETRON HCL 4 MG/2ML IJ SOLN
INTRAMUSCULAR | Status: DC | PRN
  Administered 2014-10-18: 4 mg via INTRAVENOUS

## 2014-10-18 MED ORDER — PHENYLEPHRINE 40 MCG/ML (10ML) SYRINGE FOR IV PUSH (FOR BLOOD PRESSURE SUPPORT)
PREFILLED_SYRINGE | INTRAVENOUS | Status: AC
  Filled 2014-10-18: qty 10

## 2014-10-18 MED ORDER — LIDOCAINE HCL (CARDIAC) 20 MG/ML IV SOLN
INTRAVENOUS | Status: AC
  Filled 2014-10-18: qty 5

## 2014-10-18 MED ORDER — FENTANYL CITRATE (PF) 100 MCG/2ML IJ SOLN: 25.0000 ug | INTRAMUSCULAR | Status: DC | PRN

## 2014-10-18 SURGICAL SUPPLY — 35 items
BRUSH CYTOL CELLEBRITY 1.5X140 (MISCELLANEOUS) ×5 IMPLANT
BRUSH SUPERTRAX BIOPSY (INSTRUMENTS) IMPLANT
BRUSH SUPERTRAX NDL-TIP CYTO (INSTRUMENTS) ×2 IMPLANT
CANISTER SUCTION 2500CC (MISCELLANEOUS) ×3 IMPLANT
CHANNEL WORK EXTEND EDGE 180 (KITS) IMPLANT
CHANNEL WORK EXTEND EDGE 45 (KITS) IMPLANT
CHANNEL WORK EXTEND EDGE 90 (KITS) IMPLANT
CONT SPEC 4OZ CLIKSEAL STRL BL (MISCELLANEOUS) ×3 IMPLANT
COVER TABLE BACK 60X90 (DRAPES) ×3 IMPLANT
FILTER STRAW FLUID ASPIR (MISCELLANEOUS) IMPLANT
FORCEPS BIOP SUPERTRX PREMAR (INSTRUMENTS) ×2 IMPLANT
GAUZE SPONGE 4X4 12PLY STRL (GAUZE/BANDAGES/DRESSINGS) ×3 IMPLANT
GLOVE BIO SURGEON STRL SZ7.5 (GLOVE) ×6 IMPLANT
KIT CLEAN ENDO COMPLIANCE (KITS) ×3 IMPLANT
KIT LOCATABLE GUIDE (CANNULA) IMPLANT
KIT MARKER FIDUCIAL DELIVERY (KITS) IMPLANT
KIT PROCEDURE EDGE 180 (KITS) ×2 IMPLANT
KIT PROCEDURE EDGE 45 (KITS) IMPLANT
KIT PROCEDURE EDGE 90 (KITS) IMPLANT
KIT ROOM TURNOVER OR (KITS) ×3 IMPLANT
MARKER SKIN DUAL TIP RULER LAB (MISCELLANEOUS) ×3 IMPLANT
NDL SUPERTRX PREMARK BIOPSY (NEEDLE) IMPLANT
NEEDLE SUPERTRX PREMARK BIOPSY (NEEDLE) ×3 IMPLANT
NS IRRIG 1000ML POUR BTL (IV SOLUTION) ×3 IMPLANT
OIL SILICONE PENTAX (PARTS (SERVICE/REPAIRS)) ×3 IMPLANT
PAD ARMBOARD 7.5X6 YLW CONV (MISCELLANEOUS) ×6 IMPLANT
PATCHES PATIENT (LABEL) ×3 IMPLANT
SYR 20CC LL (SYRINGE) ×3 IMPLANT
SYR 20ML ECCENTRIC (SYRINGE) ×3 IMPLANT
SYR 50ML SLIP (SYRINGE) ×3 IMPLANT
SYSTEM GENCUT CORE BIOPSY (NEEDLE) ×2 IMPLANT
TOWEL OR 17X24 6PK STRL BLUE (TOWEL DISPOSABLE) ×3 IMPLANT
TRAP SPECIMEN MUCOUS 40CC (MISCELLANEOUS) IMPLANT
TUBE CONNECTING 20'X1/4 (TUBING) ×1
TUBE CONNECTING 20X1/4 (TUBING) ×2 IMPLANT

## 2014-10-18 NOTE — Interval H&P Note (Signed)
PCCM Interval Note  Pt presents today for further w/u of a LUL nodule. Minimal uptake on PET He has no new complaints, understands the procedure.   Filed Vitals:   10/18/14 0737  BP: 166/85  Pulse: 60  Temp: 98.2 F (36.8 C)  TempSrc: Oral  Resp: 20  Weight: 88.905 kg (196 lb)  SpO2: 98%   Gen: Pleasant, well-nourished, in no distress,  normal affect  ENT: No lesions,  mouth clear,  oropharynx clear, no postnasal drip, M2 airway  Neck: No JVD, no TMG, no carotid bruits  Lungs: No use of accessory muscles, clear without rales or rhonchi  Cardiovascular: RRR, heart sounds normal, no murmur or gallops, no peripheral edema  Musculoskeletal: No deformities, no cyanosis or clubbing  Neuro: alert, non focal  Skin: Warm, telangiectasias on face and chest    Plan:  Will proceed with FOB + ENB, LUL biopsies, Percepta testing.   Baltazar Apo, MD, PhD 10/18/2014, 8:31 AM [facility] [phone] or if no answer [phone]

## 2014-10-18 NOTE — OR Nursing (Signed)
Specimen order changed per Dr. Lamonte Sakai.

## 2014-10-18 NOTE — Op Note (Signed)
Video Bronchoscopy with Electromagnetic Navigation Procedure Note  Date of Operation: 10/18/2014  Pre-op Diagnosis: LUL nodule  Post-op Diagnosis: LUL nodule  Surgeon: Baltazar Apo  Assistants: none  Anesthesia: General endotracheal anesthesia  Operation: Flexible video fiberoptic bronchoscopy with electromagnetic navigation and biopsies.  Estimated Blood Loss: 5cc  Complications: none apparent  Indications and History: George Montoya is a 74 y.o. male with hx tobacco use, noted to have a slowly enlarging LUL nodule. Recommendation was to pursue tissue dx via navigational bronchoscopy.  The risks, benefits, complications, treatment options and expected outcomes were discussed with the patient.  The possibilities of pneumothorax, pneumonia, reaction to medication, pulmonary aspiration, perforation of a viscus, bleeding, failure to diagnose a condition and creating a complication requiring transfusion or operation were discussed with the patient who freely signed the consent.    Description of Procedure: The patient was seen in the Preoperative Area, was examined and was deemed appropriate to proceed.  The patient was taken to [facility] OR 10, identified as George Montoya and the procedure verified as Flexible Video Fiberoptic Bronchoscopy.  A Time Out was held and the above information confirmed.   Prior to the date of the procedure a high-resolution CT scan of the chest was performed. Utilizing [place] a virtual tracheobronchial tree was generated to allow the creation of distinct navigation pathways to the patient's LUL abnormality. After being taken to the operating room general anesthesia was initiated and the patient  was orally intubated. The video fiberoptic bronchoscope was introduced via the endotracheal tube and a general inspection was performed which showed normal airways throughout. Two R mainstem brushings were performed to send for Percepta molecular testing. The  extendable working channel and locator guide were introduced into the bronchoscope. The distinct navigation pathways prepared prior to this procedure were then utilized to navigate to within 1.2cm of the center of the patient's lesion identified on CT scan. The extendable working channel was secured into place and the locator guide was withdrawn. Under fluoroscopic guidance transbronchial needle brushings, transbronchial Wang needle biopsies, and transbronchial forceps biopsies were performed to be sent for cytology and pathology. Gencut biopsies were obtained from the same region. A bronchioalveolar lavage was performed in the LUL and sent for cytology and microbiology (bacterial, fungal, AFB smears and cultures). At the end of the procedure a general airway inspection was performed and there was no evidence of active bleeding. The bronchoscope was removed.  The patient tolerated the procedure well. There was no significant blood loss and there were no obvious complications. A post-procedural chest x-ray is pending.  Samples: 1. Transbronchial needle brushings from LUL nodule 2. Transbronchial Wang needle biopsies from LUL nodule 3. Transbronchial forceps biopsies from LUL nodule 4. Transbronchial Gencut biopsies from LUL nodule 5. Bronchoalveolar lavage from LUL 6. Endobronchial brushings from R mainstem for Percepta  Plans:  The patient will be discharged from the PACU to home when recovered from anesthesia and after chest x-ray is reviewed. We will review the cytology, pathology and microbiology results with the patient when they become available. Outpatient followup will be with Dr Lamonte Sakai or Dr Chase Caller.    Baltazar Apo, MD, PhD 10/18/2014, 10:37 AM [place] [facility] [phone] or if no answer [phone]

## 2014-10-18 NOTE — OR Nursing (Signed)
Specimen orders amended and requisition printed for lab.

## 2014-10-18 NOTE — Anesthesia Postprocedure Evaluation (Signed)
  Anesthesia Post-op Note  Patient: George Montoya  Procedure(s) Performed: Procedure(s): VIDEO BRONCHOSCOPY WITH ENDOBRONCHIAL NAVIGATION (N/A)  Patient Location: PACU  Anesthesia Type:General  Level of Consciousness: awake and alert   Airway and Oxygen Therapy: Patient Spontanous Breathing  Post-op Pain: none  Post-op Assessment: Post-op Vital signs reviewed, Patient's Cardiovascular Status Stable, Respiratory Function Stable, Patent Airway, No signs of Nausea or vomiting and Pain level controlled  Post-op Vital Signs: Reviewed and stable  Last Vitals:  Filed Vitals:   10/18/14 1215  BP: 167/76  Pulse: 64  Temp:   Resp: 18    Complications: No apparent anesthesia complications

## 2014-10-18 NOTE — Discharge Instructions (Signed)
Flexible Bronchoscopy, Care After °These instructions give you information on caring for yourself after your procedure. Your doctor may also give you more specific instructions. Call your doctor if you have any problems or questions after your procedure. °HOME CARE °· Do not eat or drink anything for 2 hours after your procedure. If you try to eat or drink before the medicine wears off, food or drink could go into your lungs. You could also burn yourself. °· After 2 hours have passed and when you can cough and gag normally, you may eat soft food and drink liquids slowly. °· The day after the test, you may eat your normal diet. °· You may do your normal activities. °· Keep all doctor visits. °GET HELP RIGHT AWAY IF: °· You get more and more short of breath. °· You get light-headed. °· You feel like you are going to pass out (faint). °· You have chest pain. °· You have new problems that worry you. °· You cough up more than a little blood. °· You cough up more blood than before. °MAKE SURE YOU: °· Understand these instructions. °· Will watch your condition. °· Will get help right away if you are not doing well or get worse. °Document Released: 04/06/2009 Document Revised: 06/14/2013 Document Reviewed: 02/11/2013 °Exit[employer]® Patient Information ©2015 [employer]. This information is not intended to replace advice given to you by your health care provider. Make sure you discuss any questions you have with your health care provider. ° °Please call our office for any questions or concerns. [phone].  ° ° ° °

## 2014-10-18 NOTE — Anesthesia Preprocedure Evaluation (Signed)
Anesthesia Evaluation  Patient identified by MRN, date of birth, ID band Patient awake    Reviewed: Allergy & Precautions, NPO status , Patient's Chart, lab work & pertinent test results  History of Anesthesia Complications Negative for: history of anesthetic complications  Airway Mallampati: II  TM Distance: >3 FB Neck ROM: Full    Dental  (+) Teeth Intact   Pulmonary neg shortness of breath, neg sleep apnea, neg COPDneg recent URI, former smoker,  breath sounds clear to auscultation        Cardiovascular hypertension, Pt. on medications - angina+ Peripheral Vascular Disease - Past MI and - CHF Rhythm:Regular     Neuro/Psych negative neurological ROS  negative psych ROS   GI/Hepatic Neg liver ROS, GERD-  Controlled,  Endo/Other  negative endocrine ROS  Renal/GU negative Renal ROS     Musculoskeletal   Abdominal   Peds  Hematology negative hematology ROS (+)   Anesthesia Other Findings   Reproductive/Obstetrics                             Anesthesia Physical Anesthesia Plan  ASA: II  Anesthesia Plan: General   Post-op Pain Management:    Induction: Intravenous  Airway Management Planned: Oral ETT  Additional Equipment: None  Intra-op Plan:   Post-operative Plan: Extubation in OR  Informed Consent: I have reviewed the patients History and Physical, chart, labs and discussed the procedure including the risks, benefits and alternatives for the proposed anesthesia with the patient or authorized representative who has indicated his/her understanding and acceptance.   Dental advisory given  Plan Discussed with: CRNA and Surgeon  Anesthesia Plan Comments:         Anesthesia Quick Evaluation

## 2014-10-18 NOTE — Research (Signed)
Prospective Registry to Evaluate Percepta Bronchial Genomic Classifier Patient Data: [name].  This patient, George Montoya, consented to the above clinical trial according to FDA regulations, GCP guidelines and [facility] [facility] SOPs. The study design has been explained to this patient and the patient demonstrated comprehension. No study procedures have been initiated before consenting of this patient. This patient was given sufficient time for reading the consent and asking questions. All risks, benefits and options have been thoroughly discussed. This patient was not coerced in any way to participate or to continue participation in this clinical trial. The patient has signed voluntarily at 07:30am on October 18, 2014. This patient was given a copy of this consent.   Doreatha Martin, RN

## 2014-10-18 NOTE — Transfer of Care (Signed)
Immediate Anesthesia Transfer of Care Note  Patient: George Montoya  Procedure(s) Performed: Procedure(s): VIDEO BRONCHOSCOPY WITH ENDOBRONCHIAL NAVIGATION (N/A)  Patient Location: PACU  Anesthesia Type:General  Level of Consciousness: awake, alert  and oriented  Airway & Oxygen Therapy: Patient Spontanous Breathing and Patient connected to nasal cannula oxygen  Post-op Assessment: Report given to RN and Post -op Vital signs reviewed and stable  Post vital signs: Reviewed and stable  Last Vitals:  Filed Vitals:   10/18/14 1051  BP:   Pulse:   Temp: 36.5 C  Resp:     Complications: No apparent anesthesia complications

## 2014-10-18 NOTE — H&P (View-Only) (Signed)
 Subjective:    Patient ID: George Montoya, male    DOB: 11/12/1940, 73 y.o.   MRN: [id number] PCP PATERSON,DANIEL G, MD  HPI   IOV 10/02/2014  Chief Complaint  Patient presents with  . Pulmonary Consult    Pt referred by Dr. Patterson for lung mass.     73-year-old male with melanoma resection in his right shoulder area 3 years ago in [facility] and in complete remission and clinical follow-up. Previous ex-50 pack smoking history quit in 1983. In February he went to [place] and did a lot of climbing. Then on 08/22/2014 he was in the mountains in [place] and suddenly woke up with left lower quadrant back pain that was acute and severe. This resulted in extensive investigation including CT scan of the abdomen and chest by his primary care physician. Diagnoses down to chronic low back issues and he is on opioids. Pain is improved steadily and for the last few days he's only been on NSAIDs. Investigation with a CT scan of the chest in March 2016 revealed a left upper lobe nodule. The CT scan of the chest is not available for me because it was done [facility]. He had a follow-up PET scan 09/14/2014 that shows a 1.7 cm lobulated left upper lobe nodule but without any PET scan uptake. He is here to discuss this. He is extremely nervous about possibility of cancer. Both his parents deceased from cancer. Father died at age 65 from prostate cancer untreated and mother died from breast cancer.  D/w 3:08 PM 10/02/2014: DR Dan Jones - only melaoma in-situ. Very low probability of distant mets at this current point unless is different from site which is unlikely   has a past medical history of High cholesterol; Hypertension; Bowel obstruction; and Melanoma.   reports that he quit smoking about 32 years ago. His smoking use included Cigarettes. He has a 50 pack-year smoking history. He has never used smokeless tobacco.  Past Surgical History  Procedure Laterality Date  . Melanoma  excision    . Hernia repair    . Appendectomy      Allergies  Allergen Reactions  . Dust Mite Extract   . Adhesive [Tape] Rash    Immunization History  Administered Date(s) Administered  . Pneumococcal-Unspecified 06/23/2004    No family history on file.   Current outpatient prescriptions:  .  carvedilol (COREG) 12.5 MG tablet, Take 1 tablet (12.5 mg total) by mouth 2 (two) times daily. (Patient taking differently: Take 6.25 mg by mouth 2 (two) times daily. ), Disp: 180 tablet, Rfl: 3 .  Chlorpheniramine Maleate (ALLERGY PO), Take 1 tablet by mouth every morning., Disp: , Rfl:  .  CHOLECALCIFEROL PO, Take 600 mg by mouth every morning., Disp: , Rfl:  .  diphenhydrAMINE (BENADRYL) 25 MG tablet, Take 25 mg by mouth every morning., Disp: , Rfl:  .  Multiple Vitamins-Minerals (MULTIVITAL) tablet, Take 1 tablet by mouth daily., Disp: , Rfl:  .  rosuvastatin (CRESTOR) 10 MG tablet, Take 7.5 mg by mouth every Monday, Wednesday, and Friday. In AM, Disp: , Rfl:  .  telmisartan-hydrochlorothiazide (MICARDIS HCT) 40-12.5 MG per tablet, Take 0.5 tablets by mouth daily., Disp: 45 tablet, Rfl: 3 .  predniSONE (DELTASONE) 20 MG tablet, Take 60 mg by mouth daily with breakfast. 60mg for 20 days for loss of hearing, Disp: , Rfl:      Review of Systems  Constitutional: Negative for fever and unexpected weight change.    HENT: Positive for nosebleeds. Negative for congestion, dental problem, ear pain, postnasal drip, rhinorrhea, sinus pressure, sneezing, sore throat and trouble swallowing.   Eyes: Negative for redness and itching.  Respiratory: Negative for cough, chest tightness, shortness of breath and wheezing.   Cardiovascular: Negative for palpitations and leg swelling.  Gastrointestinal: Negative for nausea and vomiting.  Genitourinary: Negative for dysuria.  Musculoskeletal: Negative for joint swelling.  Skin: Negative for rash.  Neurological: Negative for headaches.  Hematological: Does  not bruise/bleed easily.  Psychiatric/Behavioral: Negative for dysphoric mood. The patient is not nervous/anxious.        Objective:   Physical Exam  Constitutional: He is oriented to person, place, and time. He appears well-developed and well-nourished. No distress.  HENT:  Head: Normocephalic and atraumatic.  Right Ear: External ear normal.  Left Ear: External ear normal.  Mouth/Throat: Oropharynx is clear and moist. No oropharyngeal exudate.  Eyes: Conjunctivae and EOM are normal. Pupils are equal, round, and reactive to light. Right eye exhibits no discharge. Left eye exhibits no discharge. No scleral icterus.  Neck: Normal range of motion. Neck supple. No JVD present. No tracheal deviation present. No thyromegaly present.  Cardiovascular: Normal rate, regular rhythm and intact distal pulses.  Exam reveals no gallop and no friction rub.   No murmur heard. Pulmonary/Chest: Effort normal and breath sounds normal. No respiratory distress. He has no wheezes. He has no rales. He exhibits no tenderness.  Abdominal: Soft. Bowel sounds are normal. He exhibits no distension and no mass. There is no tenderness. There is no rebound and no guarding.  Visceral obesity  Musculoskeletal: Normal range of motion. He exhibits no edema or tenderness.  Lymphadenopathy:    He has no cervical adenopathy.  Neurological: He is alert and oriented to person, place, and time. He has normal reflexes. No cranial nerve deficit. Coordination normal.  Skin: Skin is warm and dry. No rash noted. He is not diaphoretic. No erythema. No pallor.  Nevus in back  Scar of melanoma resection rt shoulder area 3 years ago  Psychiatric: He has a normal mood and affect. His behavior is normal. Judgment and thought content normal.  Nursing note and vitals reviewed.   Filed Vitals:   10/02/14 0943  BP: 152/90  Pulse: 70  Height: 5' 8" (1.727 m)  Weight: 196 lb (88.905 kg)  SpO2: 98%         Assessment & Plan:      ICD-9-CM ICD-10-CM   1. Nodule of left lung 793.11 R91.1 Pulmonary Function Test  2. Stopped smoking with greater than 40 pack year history V15.82 Z87.891    My concern is lung cancer and metastatic melanoma nodule of the left upper lobe. The pretest probably ready for lung cancer was high based on age, sex, smoking history and size of the nodule but with a negative PET scan and this is dropped to low intermediate. However his melanoma history is recent and I'm wondering if this could be metastatic melanoma of the lung.  I think you'll benefit from electromagnetic navigational bronchoscopy and transbronchial biopsy. The risks of bleeding pneumothorax all low and limits of non-diagnosis were explained. The alternate course of serial CT scans imaging every few months was explained. He is willing to go with whatever I recommend. My bias is to subject him to bronchoscopy and transbronchial biopsy now because of the potential possibility of melanoma and lung cancer despite the negative PET scan. He might be a good candidate for percepta  registry   to adjust the pretest probability of lung nodule for lung cancer   Dr. Eladio Dentremont, M.D., F.C.C.P Pulmonary and Critical Care Medicine Staff Physician [facility] Pager: [phone], If no answer or between  15:00h - 7:00h: call [phone]  10/02/2014 10:29 AM    

## 2014-10-18 NOTE — Anesthesia Procedure Notes (Signed)
Procedure Name: Intubation Date/Time: 10/18/2014 8:51 AM Performed by: Mariea Clonts Pre-anesthesia Checklist: Patient identified, Emergency Drugs available, Suction available and Patient being monitored Patient Re-evaluated:Patient Re-evaluated prior to inductionOxygen Delivery Method: Circle system utilized Preoxygenation: Pre-oxygenation with 100% oxygen Intubation Type: IV induction Ventilation: Mask ventilation without difficulty and Oral airway inserted - appropriate to patient size Laryngoscope Size: Sabra Heck and 2 Grade View: Grade I Tube type: Oral Tube size: 8.5 mm Number of attempts: 2 Airway Equipment and Method: Stylet Placement Confirmation: ETT inserted through vocal cords under direct vision,  positive ETCO2,  CO2 detector and breath sounds checked- equal and bilateral Secured at: 24 cm Tube secured with: Tape Dental Injury: Teeth and Oropharynx as per pre-operative assessment

## 2014-10-19 ENCOUNTER — Encounter (HOSPITAL_COMMUNITY): Payer: Self-pay | Admitting: Emergency Medicine

## 2014-10-20 ENCOUNTER — Ambulatory Visit (INDEPENDENT_AMBULATORY_CARE_PROVIDER_SITE_OTHER): Payer: BLUE CROSS/BLUE SHIELD | Admitting: Internal Medicine

## 2014-10-20 ENCOUNTER — Telehealth: Payer: Self-pay | Admitting: Emergency Medicine

## 2014-10-20 DIAGNOSIS — R911 Solitary pulmonary nodule: Secondary | ICD-10-CM

## 2014-10-20 LAB — PULMONARY FUNCTION TEST
DL/VA % pred: 87 %
DL/VA: 3.93 ml/min/mmHg/L
DLCO unc % pred: 85 %
DLCO unc: 25.93 ml/min/mmHg
FEF 25-75 Post: 2.24 L/sec
FEF 25-75 Pre: 2.32 L/sec
FEF2575-%Change-Post: -3 %
FEF2575-%PRED-POST: 104 %
FEF2575-%Pred-Pre: 107 %
FEV1-%Change-Post: 0 %
FEV1-%PRED-POST: 104 %
FEV1-%PRED-PRE: 105 %
FEV1-PRE: 3.1 L
FEV1-Post: 3.07 L
FEV1FVC-%Change-Post: -3 %
FEV1FVC-%PRED-PRE: 100 %
FEV6-%Change-Post: 2 %
FEV6-%Pred-Post: 113 %
FEV6-%Pred-Pre: 110 %
FEV6-Post: 4.3 L
FEV6-Pre: 4.18 L
FEV6FVC-%Change-Post: 0 %
FEV6FVC-%PRED-PRE: 105 %
FEV6FVC-%Pred-Post: 105 %
FVC-%Change-Post: 3 %
FVC-%PRED-PRE: 104 %
FVC-%Pred-Post: 107 %
FVC-PRE: 4.21 L
FVC-Post: 4.34 L
POST FEV1/FVC RATIO: 71 %
POST FEV6/FVC RATIO: 99 %
PRE FEV1/FVC RATIO: 74 %
Pre FEV6/FVC Ratio: 99 %
RV % pred: 112 %
RV: 2.75 L
TLC % pred: 103 %
TLC: 6.96 L

## 2014-10-20 LAB — CULTURE, RESPIRATORY W GRAM STAIN: Culture: NO GROWTH

## 2014-10-20 LAB — CULTURE, RESPIRATORY

## 2014-10-20 NOTE — Progress Notes (Signed)
PFT done today. 

## 2014-10-20 NOTE — Telephone Encounter (Signed)
Biopsies show atypical cells but was non-diagnostic. Percepta is pending. I notified the pt about the results. Explained the potential for a false negative. He understands. Will discuss with dr Chase Caller future plans for imaging or repeat bx

## 2014-10-25 ENCOUNTER — Telehealth: Payer: Self-pay | Admitting: Internal Medicine

## 2014-10-25 DIAGNOSIS — R911 Solitary pulmonary nodule: Secondary | ICD-10-CM

## 2014-10-25 NOTE — Telephone Encounter (Signed)
Let Mr George Montoya  Know that bipsy results were discussed by Dr Marisa Hua to him - please confirm that. It is non diagnostic. Based on this - do FU CT chest wo contrast in 3 months and rov for followup

## 2014-10-26 NOTE — Telephone Encounter (Signed)
Pt returned call. Informed him of the results and recs per MR. Recall for appt placed and CT ordered. Pt verbalized understanding and denied any further questions or concerns at this time.

## 2014-10-26 NOTE — Telephone Encounter (Signed)
lmtcb for pt.  

## 2014-11-14 ENCOUNTER — Ambulatory Visit: Payer: BLUE CROSS/BLUE SHIELD | Admitting: Emergency Medicine

## 2014-11-14 ENCOUNTER — Inpatient Hospital Stay: Payer: BLUE CROSS/BLUE SHIELD | Admitting: Internal Medicine

## 2014-11-15 LAB — FUNGUS CULTURE W SMEAR: Fungal Smear: NONE SEEN

## 2014-11-27 ENCOUNTER — Inpatient Hospital Stay: Payer: BLUE CROSS/BLUE SHIELD | Admitting: Internal Medicine

## 2014-11-30 LAB — AFB CULTURE WITH SMEAR (NOT AT ARMC): Acid Fast Smear: NONE SEEN

## 2015-01-17 ENCOUNTER — Telehealth: Payer: Self-pay | Admitting: Emergency Medicine

## 2015-01-17 NOTE — Telephone Encounter (Signed)
Pt has CT chest scheduled for 8.3.16. Pt was to f/u after CT for 3 month ROV. Please advise if ok to see TP or wait till after scan.   MR please advise. Thanks.

## 2015-01-17 NOTE — Telephone Encounter (Signed)
He needs to be seen first week agusut- this is somet hing TP can handle. He had non-diag bx. So this is surveillance CT 3 months. I dont want him to wait. If is enlarging TP can call me or d/w Byrum who did ENB on case and might be able to help  Pls ask him if he is ok with seeing TP. I will reivwe scan upon my return 8/7;16 and if any change in plan can let patient know then

## 2015-01-17 NOTE — Telephone Encounter (Signed)
LMTCB x 1 for patient

## 2015-01-18 ENCOUNTER — Telehealth: Payer: Self-pay | Admitting: *Deleted

## 2015-01-18 NOTE — Telephone Encounter (Signed)
Called patient to let him know I was trying to get a PA renewed for the Micardis HCT.  Patient states the pharmacist called him last week to let him know there is now a generic available and patient agreed to try.  States he has taken for about a week with no problems.  He is more than willing to stay on the generic and will let us know if he has any problems.

## 2015-01-18 NOTE — Telephone Encounter (Signed)
Pt cb, sched f/u after CT..  CT sched for 8/3 F/u after CT sched for 8/9 w/TP  Nothing further needed

## 2015-01-24 ENCOUNTER — Ambulatory Visit (INDEPENDENT_AMBULATORY_CARE_PROVIDER_SITE_OTHER)
Admission: RE | Admit: 2015-01-24 | Discharge: 2015-01-24 | Disposition: A | Discharge status: Home / Self Care | Payer: BLUE CROSS/BLUE SHIELD | Source: Ambulatory Visit | Attending: Internal Medicine | Admitting: Internal Medicine

## 2015-01-24 DIAGNOSIS — R911 Solitary pulmonary nodule: Secondary | ICD-10-CM

## 2015-01-28 ENCOUNTER — Telehealth: Payer: Self-pay | Admitting: Internal Medicine

## 2015-01-28 NOTE — Telephone Encounter (Addendum)
Rob (cc to TP)  You did ENB on George Montoya in April 2016. IT was non-diagnostic."Focal Atypica".  He had CT chest followup 01/24/15 and they are reporting a internal solid component NOW although they do not say if solid component new.  I might recommend surgery but still seeing how we can adjust the odds before recommending surgery.   Doreatha Martin from research tells me that you did do PERCEPTA on this patient. However, she does NOT have the results and neither do I see it scanned in Epic. Do you have the reesults. IF Percepta is low prob we might recommend continued surveillance but if inconclusive then Oncimmune blood test v surgery   Let me know ASAP because TP is seeing him 8.8./16 tuesday  Thanks  Dr. Brand Males, M.D., [facility].C.P Pulmonary and Critical Care Medicine Staff Physician [place] [facility] Pager: [phone], If no answer or between  15:00h - 7:00h: call [phone]  01/28/2015 9:37 PM      IMPRESSION: 1. Similar size of 2.2 cm part solid nodule which has an internal solid component measuring 1.7 cm. This light the low level, malignant range FDG uptake on PET scan, this remains concerning for a pulmonary adenocarcinoma and correlation with biopsy and/or surgical resection is recommended. 2. Aortic atherosclerosis.   Electronically Signed  By: Kerby Moors M.D.  On: 01/24/2015 10:23  No results found.

## 2015-01-29 NOTE — Telephone Encounter (Signed)
I will find the results and get the information to TP today

## 2015-01-29 NOTE — Telephone Encounter (Signed)
Tammy  D.w Rob and I got results of the PErcepta that I will email you 01/29/2015 -   - LUL nodule - is unchanged x 4 months. I reviewd it with Dr Zigmund Daniel. There is no change in density either  -ENB is non diagnostic  Based on above   - Risk for cancer is intermediate  However, new PERCEPTA test says   - Low risk < 10% for malignancy  Options are  - continued observation with doing Oncimmune blood test - there is a kit in my office - this test takes a few weeks to result. Is specific for cancer bu not sensitive. So, if positive can help but if negative wont   - CT guided TTNA - I d/w Dr Art Barbie Banner of IR - feels doable biopsy with some but low risk for PTx, bleeding. Again patient needs to be aware of non-diagnosis risk but if this is also non-diagnostic then probably truly benign and we can continue to watch   - direct lobectomy - disad is if benign he has had needless lobectomy   If you need to d/;w me on phone please call me anytime  Thanks  Dr. Brand Males, M.D., [facility].C.P Pulmonary and Critical Care Medicine Staff Physician [place] [facility] Pager: [phone], If no answer or between  15:00h - 7:00h: call [phone]  01/29/2015 4:32 PM

## 2015-01-30 ENCOUNTER — Ambulatory Visit (INDEPENDENT_AMBULATORY_CARE_PROVIDER_SITE_OTHER): Payer: BLUE CROSS/BLUE SHIELD | Admitting: Adult Health

## 2015-01-30 ENCOUNTER — Encounter: Payer: Self-pay | Admitting: Adult Health

## 2015-01-30 VITALS — BP 142/86 | HR 60 | Temp 97.8°F | Ht 68.0 in | Wt 194.0 lb

## 2015-01-30 DIAGNOSIS — R911 Solitary pulmonary nodule: Secondary | ICD-10-CM

## 2015-01-30 NOTE — Patient Instructions (Signed)
Oncimmune blood test today  Set up CT chest in November to follow lung nodule  Follow up Dr. Chase Caller after CT to discuss results.  Please contact office for sooner follow up if needed.

## 2015-01-30 NOTE — Assessment & Plan Note (Signed)
LUL nodule with non diagnostic ENB , Percepta test low risk , PET w/ low hypermetablic range, CT chest shows no growth in last 4 months.  Pt has intermediate risk with family hx , smoking hx  Discussed options to proceed. : surveillance with oncimmune blood test, CT guided TTNA , or direct lobectomy  >pt prefers to proceed with CT surveillance with Oncimmune testing  If positive will need to decide on CT bx vs lobectomy  If neg , will cont w/ repeat CT in 3 months.  Pros and cons reviewed with pt in detail   Plan  Oncimmune blood test today  Set up CT chest in November to follow lung nodule  Follow up Dr. Chase Caller after CT to discuss results.  Please contact office for sooner follow up if needed.

## 2015-01-30 NOTE — Progress Notes (Signed)
Subjective:    Patient ID: George Montoya, male    DOB: 03-16-1941, 74 y.o.   MRN: [id number]  HPI 74 yo male former smoker with pulmonary consult with Dr. Chase Caller 09/2014 for lung nodule   01/30/2015 Follow up : Lung nodule , former smoker  74 year old male with melanoma resection in his right shoulder area 3 years ago in [facility] dermatology and in complete remission and clinical follow-up. Previous ex-50 pack smoking history quit in 1983. In February he went to [place] and did a lot of climbing. Then on 08/22/2014 he was in the mountains in [place] and suddenly woke up with left lower quadrant back pain that was acute and severe. This resulted in extensive investigation including CT scan of the abdomen and chest by his primary care physician. Diagnoses down to chronic low back issues and he is on opioids. P  Investigation with a CT scan of the chest in March 2016 revealed a left upper lobe nodule.  He had a follow-up PET scan 09/14/2014 that shows a 1.7 cm lobulated left upper lobe nodule but without any PET scan uptake.  . Both his parents deceased from cancer. Father died at age 17 from prostate cancer untreated and mother died from breast cancer. CT Chest super D cuts showed 2.1 x 1.5 cm LUL nodule on 10/16/14 .  Pt underwent ENB with biopsy showing atypical cells but non diagnostic.  Percepta test showed low risk <10% for malignancy .  PFT showed normal lung function with no airflow obstruction  Repeat CT chest on 01/24/15 showed similar nodule 2.2 x 2.1 cm with internal solid component at 1.7 cm   We discussed these results in detail with the following options reviewed Case discussed and CT /results reviewed in detail with Dr. Chase Caller   LUL nodule - is unchanged x 4 months. Dr. Chase Caller reviewd it with Dr Zigmund Daniel. There is no change in density either -ENB is non diagnostic  Based on above  - Risk for cancer is intermediate  However, new PERCEPTA test  says  - Low risk < 10% for malignancy  Options are - continued observation with doing Oncimmune blood test -  - CT guided TTNA - I d/w Dr Art Barbie Banner of IR - feels doable biopsy with some but low risk for PTx, bleeding. Again patient needs to be aware of non-diagnosis risk but if this is also non-diagnostic then probably truly benign and we can continue to watch  - direct lobectomy - disad is if benign he has had needless lobectomy  We reviewed each option in detail with pro and cons .  We discussed the CT guided TTNA with suggestion that this would be another way , less invasive than lobectomy but , to try to get tissue sample .  He would like to proceed with continued observation with Oncimmune blood test , if test returns positive will then need to proceed with bx  Vs direct lobectomy.  He is aware will take 3 -4 weeks for return of test.  Plan for repeat CT chest in 3 months for surveillance if test is neg (although does not have neg predictability) .   He denies chest pain, orthopnea, hemoptysis , dyspnea or unintentional weight loss.    Review of Systems  Constitutional:   No  weight loss, night sweats,  Fevers, chills, fatigue, or  lassitude.  HEENT:   No headaches,  Difficulty swallowing,  Tooth/dental problems, or  Sore throat,  No sneezing, itching, ear ache, nasal congestion, post nasal drip,   CV:  No chest pain,  Orthopnea, PND, swelling in lower extremities, anasarca, dizziness, palpitations, syncope.   GI  No heartburn, indigestion, abdominal pain, nausea, vomiting, diarrhea, change in bowel habits, loss of appetite, bloody stools.   Resp: No shortness of breath with exertion or at rest.  No excess mucus, no productive cough,  No non-productive cough,  No coughing up of blood.  No change in color of mucus.  No wheezing.  No chest wall deformity  Skin: no rash or lesions.  GU: no dysuria, change in color of  urine, no urgency or frequency.  No flank pain, no hematuria   MS:  No joint pain or swelling.  No decreased range of motion.  No back pain.  Psych:  No change in mood or affect. No depression or anxiety.  No memory loss.          Objective:   Physical Exam GEN: A/Ox3; pleasant , NAD, well nourished   HEENT:  [place]/AT,  EACs-clear, TMs-wnl, NOSE-clear, THROAT-clear, no lesions, no postnasal drip or exudate noted.   NECK:  Supple w/ fair ROM; no JVD; normal carotid impulses w/o bruits; no thyromegaly or nodules palpated; no lymphadenopathy.  RESP  Clear  P & A; w/o, wheezes/ rales/ or rhonchi.no accessory muscle use, no dullness to percussion  CARD:  RRR, no m/r/g  , no peripheral edema, pulses intact, no cyanosis or clubbing.  GI:   Soft & nt; nml bowel sounds; no organomegaly or masses detected.  Musco: Warm bil, no deformities or joint swelling noted.   Neuro: alert, no focal deficits noted.    Skin: Warm, no lesions or rashes         Assessment & Plan:

## 2015-02-02 ENCOUNTER — Telehealth: Payer: Self-pay | Admitting: Internal Medicine

## 2015-02-02 NOTE — Telephone Encounter (Signed)
lmtcb for George Montoya.

## 2015-02-02 NOTE — Telephone Encounter (Signed)
Returned call to [facility]- Regarding the Early CDT Lung Tests - states that she needs to set up an account for blood test that was submitted on this patient.  They are unable to bill the patient's insurance without this being done first. States that there is a form that will be faxed for MR to fill out and sign and from there the account will be created and blood work can be processed. Please advise Daneil Dan if you are familiar with this process. [name] is needing a call back today to start this process. Thanks.

## 2015-02-06 NOTE — Telephone Encounter (Signed)
I called spoke with George Montoya-no form received. She has been faxing to wrong #. Gave corerct fax #. Will forward to Hersey to f/u on

## 2015-02-07 NOTE — Telephone Encounter (Signed)
Received form. Will have MR review and sign. Will update chart when form has been faxed back.

## 2015-02-12 NOTE — Telephone Encounter (Signed)
Per Daneil Dan - will update chart once form has been faxed

## 2015-02-14 NOTE — Telephone Encounter (Signed)
Per Daneil Dan - will close encounter when complete

## 2015-02-19 NOTE — Telephone Encounter (Signed)
Per Daneil Dan - will close encounter once completed.

## 2015-02-20 NOTE — Telephone Encounter (Signed)
Called and informed her that the form is MR's possession and once it is returned back to me I will fax it. [name] verbalized understanding.

## 2015-02-20 NOTE — Telephone Encounter (Signed)
Nikki cb to check on status and make sure we received forms faxed to our office, also wanting to know if we needed anything else, [phone]

## 2015-02-22 NOTE — Telephone Encounter (Signed)
This has not been returned back to me yet.

## 2015-02-22 NOTE — Telephone Encounter (Signed)
Daneil Dan please advise if you've received this from MR.  Thanks!

## 2015-02-23 NOTE — Telephone Encounter (Signed)
jsut faxed to [phone] to attention [place]. Are the results back?

## 2015-02-23 NOTE — Telephone Encounter (Signed)
Form faxed back to the provided number. I have not seen the results yet. ATC [name] to inform her of the fax and see when the results will be faxed. WCB.

## 2015-02-23 NOTE — Telephone Encounter (Signed)
Papers are in Triage in Elise's folders.

## 2015-02-28 NOTE — Telephone Encounter (Signed)
lmtcb for [name].

## 2015-03-01 NOTE — Telephone Encounter (Signed)
Called George Montoya and she reports she has been faxing over now the requisition form (a diff fax they need) that has to be filled out with the pt information on it. Daneil Dan did you receive this?

## 2015-03-02 NOTE — Telephone Encounter (Signed)
I received one fax which was the account form which was already faxed back.

## 2015-03-05 NOTE — Telephone Encounter (Addendum)
Called and spoke to [place]. [name] stated she faxed the patient requisition form on Friday 9.9.16. Received form this morning (9.12.16). Form will need to be completed and signed by MR or TP. Form will then need to be faxed back then they can result the lab test. Will await return of TP or MR to have form signed. [name] aware it may not get faxed back to her until next week.

## 2015-03-14 ENCOUNTER — Telehealth: Payer: Self-pay | Admitting: Internal Medicine

## 2015-03-14 NOTE — Telephone Encounter (Signed)
George Montoya, please advise if form was sent back (see phone message 02/02/15)

## 2015-03-16 NOTE — Telephone Encounter (Signed)
Form faxed back to [facility]. Nothing further needed at this time.

## 2015-03-16 NOTE — Telephone Encounter (Signed)
Form faxed back to [facility]. Nothing further needed.

## 2015-04-08 ENCOUNTER — Other Ambulatory Visit: Payer: Self-pay | Admitting: Cardiovascular Disease

## 2015-04-09 NOTE — Telephone Encounter (Signed)
Rx request sent to pharmacy.  

## 2015-04-24 ENCOUNTER — Telehealth: Payer: Self-pay | Admitting: Internal Medicine

## 2015-04-24 NOTE — Telephone Encounter (Signed)
George Montoya  onciummune blood test from 8/9/`6 shows is "unchanged" risk for cancer. In other words test was negative/ So continued observaton via CT chest  Keep appt for CT and OV with TP in nov 2016  Dr. Brand Males, M.D., [facility].C.P Pulmonary and Critical Care Medicine Staff Physician [place] Pager: [phone], If no answer or between  15:00h - 7:00h: call [phone]  04/24/2015 3:42 PM

## 2015-04-26 NOTE — Telephone Encounter (Signed)
Contacted pt with results per MR Pt expressed understanding, no further concerns.

## 2015-05-01 ENCOUNTER — Ambulatory Visit (INDEPENDENT_AMBULATORY_CARE_PROVIDER_SITE_OTHER)
Admission: RE | Admit: 2015-05-01 | Discharge: 2015-05-01 | Disposition: A | Discharge status: Home / Self Care | Payer: BLUE CROSS/BLUE SHIELD | Source: Ambulatory Visit | Attending: Adult Health | Admitting: Adult Health

## 2015-05-01 DIAGNOSIS — R911 Solitary pulmonary nodule: Secondary | ICD-10-CM | POA: Diagnosis not present

## 2015-05-02 ENCOUNTER — Inpatient Hospital Stay: Admission: RE | Admit: 2015-05-02 | Payer: BLUE CROSS/BLUE SHIELD | Source: Ambulatory Visit

## 2015-05-03 ENCOUNTER — Encounter: Payer: Self-pay | Admitting: Adult Health

## 2015-05-03 ENCOUNTER — Ambulatory Visit (INDEPENDENT_AMBULATORY_CARE_PROVIDER_SITE_OTHER): Payer: BLUE CROSS/BLUE SHIELD | Admitting: Adult Health

## 2015-05-03 VITALS — BP 150/70 | HR 60 | Temp 98.6°F | Ht 68.0 in | Wt 195.0 lb

## 2015-05-03 DIAGNOSIS — R911 Solitary pulmonary nodule: Secondary | ICD-10-CM

## 2015-05-03 NOTE — Assessment & Plan Note (Addendum)
Enlarging LUL lung nodule suspcious for underlying malignancy  Pt has smoking hx , preserved lung funtion on PFT  Pt has underwent extensive workup with previous PET scan-weakly positive, nondiagnostic bx , and low risk PERCEPTA test  However most recent CT chest this week shows enlarging lung nodule that increased from 2.2cm to 3.1 cm  We discussed these results and pt will be referred to Dr. Cyndia Bent for evaluation /consideration for resection

## 2015-05-03 NOTE — Patient Instructions (Signed)
Refer to surgeon Dr. Cyndia Bent .  Continue on current regimen.

## 2015-05-03 NOTE — Progress Notes (Signed)
Subjective:    Patient ID: George Montoya, male    DOB: 10-02-1940, 74 y.o.   MRN: [id number]  HPI 74 yo male former smoker with pulmonary consult with Dr. Chase Caller 09/2014 for lung nodule   TEST :  CT Chest super D cuts showed 2.1 x 1.5 cm LUL nodule on 10/16/14 .  Pt underwent ENB with biopsy showing atypical cells but non diagnostic.  Percepta test showed low risk <10% for malignancy .  PFT showed normal lung function with no airflow obstruction  Repeat CT chest on 01/24/15 showed similar nodule 2.2 x 2.1 cm with internal solid component at 1.7 cm   05/03/2015 Follow up : Lung nodule , former smoker  74 year old male with melanoma resection in his right shoulder area 3 years ago in [facility] dermatology and in complete remission. Previous ex-50 pack smoking history quit in 1983. In February he went to [place] and did a lot of climbing. Then on 08/22/2014 he was in the mountains in [place] and suddenly woke up with left lower quadrant back pain .  CT scan of the chest in March 2016 revealed a left upper lobe nodule.  He had a follow-up PET scan 09/14/2014 that shows a 1.7 cm lobulated left upper lobe nodule-weakly hypermetabolic  10/15/93 CT Chest super D cuts showed 2.1 x 1.5 cm LUL nodule on 10/16/14 .  Pt underwent ENB with biopsy showing atypical cells but non diagnostic.  Percepta test showed low risk <10% for malignancy .  PFT showed normal lung function with no airflow obstruction  Repeat CT chest on 01/24/15 showed similar nodule 2.2 x 2.1 cm with internal solid component at 1.7 cm  These results were reviewed with plans for close follow up CT chest in Nov.  CT chest done 05/01/15 showed increased LUL nodule now measuring 3.1 x 2.3cm . No enlarged lymph nodes.  We discussed these results  With patient. He has seen Dr. Cyndia Bent in April .  We discussed going back to Dr. Cyndia Bent for evaluation for possible resection . His lung function was normal on PFT in April 2016.   He says he  feels fine. No cough, hemoptysis, chest pain, or weight loss.    Review of Systems  Constitutional:   No  weight loss, night sweats,  Fevers, chills, fatigue, or  lassitude.  HEENT:   No headaches,  Difficulty swallowing,  Tooth/dental problems, or  Sore throat,                No sneezing, itching, ear ache, nasal congestion, post nasal drip,   CV:  No chest pain,  Orthopnea, PND, swelling in lower extremities, anasarca, dizziness, palpitations, syncope.   GI  No heartburn, indigestion, abdominal pain, nausea, vomiting, diarrhea, change in bowel habits, loss of appetite, bloody stools.   Resp: No shortness of breath with exertion or at rest.  No excess mucus, no productive cough,  No non-productive cough,  No coughing up of blood.  No change in color of mucus.  No wheezing.  No chest wall deformity  Skin: no rash or lesions.  GU: no dysuria, change in color of urine, no urgency or frequency.  No flank pain, no hematuria   MS:  No joint pain or swelling.  No decreased range of motion.  No back pain.  Psych:  No change in mood or affect. No depression or anxiety.  No memory loss.          Objective:   Physical Exam  GEN: A/Ox3; pleasant , NAD, well nourished   HEENT:  [place]/AT,  EACs-clear, TMs-wnl, NOSE-clear, THROAT-clear, no lesions, no postnasal drip or exudate noted.   NECK:  Supple w/ fair ROM; no JVD; normal carotid impulses w/o bruits; no thyromegaly or nodules palpated; no lymphadenopathy.  RESP  Clear  P & A; w/o, wheezes/ rales/ or rhonchi.no accessory muscle use, no dullness to percussion  CARD:  RRR, no m/r/g  , no peripheral edema, pulses intact, no cyanosis or clubbing.  GI:   Soft & nt; nml bowel sounds; no organomegaly or masses detected.  Musco: Warm bil, no deformities or joint swelling noted.   Neuro: alert, no focal deficits noted.    Skin: Warm, no lesions or rashes   05/01/15 CT chest reviewed independently  Interval growth of a mixed subsolid and  solid nodule in the left upper lobe, as detailed above, which remains highly concerning for primary bronchogenic adenocarcinoma. Surgical resection is strongly suggested.      Assessment & Plan:

## 2015-05-03 NOTE — Addendum Note (Signed)
Addended by: Osa Craver on: 05/03/2015 05:21 PM   Modules accepted: Orders

## 2015-05-09 ENCOUNTER — Encounter: Payer: Self-pay | Admitting: Surgery

## 2015-05-09 ENCOUNTER — Ambulatory Visit (INDEPENDENT_AMBULATORY_CARE_PROVIDER_SITE_OTHER): Payer: BLUE CROSS/BLUE SHIELD | Admitting: Surgery

## 2015-05-09 VITALS — BP 189/97 | HR 63 | Resp 20 | Ht 68.0 in | Wt 195.0 lb

## 2015-05-09 DIAGNOSIS — R911 Solitary pulmonary nodule: Secondary | ICD-10-CM | POA: Diagnosis not present

## 2015-05-09 NOTE — Progress Notes (Signed)
HPI:  The patient returns for follow up of his left upper lobe lung nodule. He is a 74 year old gentleman with a 50 pack-year smoking history until he quit in 1983, melanoma-in-situ resection from the right shoulder 3 years ago who woke up with severe left lower back pain on 08/22/2014 while in the [place] of [place]. He thought he had a kidney stone. He had a CT scan of the abdomen and chest which showed a 1.7 cm nodule in the left upper lobe of the lung and an incidental 4.1 cm fusiform ascending aortic aneurysm. There was no kidney stone and his pain resolved with pain medication and was felt to be musculoskeletal. He had a PET scan on 09/14/2014 that showed a 1.7 cm lobulated LUL nodule that was weakly hypermetabolic with an SUV max of 1.9. There were no hypermetabolic or enlarged mediastinal or hilar lymph nodes and no other abnormality except the ascending aortic aneurysm. He was seen by Dr. Chase Caller and underwent bronchoscopy and ENB with biopsies by Dr. Royston Cowper on 10/18/2014. All of the biopsies and brushings were negative for malignancy. He had a follow up CT scan on 01/24/2015 that showed no changed in the size of the nodule measuring 2.2 cm with an internal solid component measuring 1.7 cm. A repeat scan on 05/01/2015 shows that the lesion has increased in size to 3.1 x 2.3 cm and remains suspicious for a slow-growing neoplasm.   The patient's mother died of lung cancer and his father died of prostate cancer. He is a remote smoker.   Current Outpatient Prescriptions  Medication Sig Dispense Refill  . Calcium Carb-Cholecalciferol (CALCIUM 600 + D PO) Take 600 mg by mouth daily.    . carvedilol (COREG) 12.5 MG tablet Take 1 tablet (12.5 mg total) by mouth 2 (two) times daily. 180 tablet 3  . ibuprofen (ADVIL,MOTRIN) 200 MG tablet Take 200 mg by mouth every 6 (six) hours as needed for mild pain or moderate pain.    . Multiple Vitamin (MULTIVITAMIN WITH MINERALS) TABS tablet Take 2  tablets by mouth daily.    . rosuvastatin (CRESTOR) 10 MG tablet Take 5 mg by mouth every Monday, Wednesday, and Friday. In AM    . telmisartan-hydrochlorothiazide (MICARDIS HCT) 40-12.5 MG tablet TAKE 1/2 TABLET DAILY. 15 tablet 10  . oxyCODONE-acetaminophen (PERCOCET/ROXICET) 5-325 MG per tablet Take 0.5 tablets by mouth 2 (two) times daily as needed (pain).    . predniSONE (DELTASONE) 20 MG tablet Take 60 mg by mouth See admin instructions. Take 3 tablets (60 mg) daily for 20 days whenever experiencing an episode of hearing loss     No current facility-administered medications for this visit.     Physical Exam: BP 189/97 mmHg  Pulse 63  Resp 20  Ht '5\' 8"'$  (1.727 m)  Wt 195 lb (88.451 kg)  BMI 29.66 kg/m2  SpO2 98% He looks well There is no cervical or supraclavicular adenopathy Lungs are clear Cardiac exam shows a regular rate and rhythm with normal heart sounds.  Diagnostic Tests:  CLINICAL DATA: 74 year old male with history of lung nodule. Three-month follow-up.  EXAM: CT CHEST WITHOUT CONTRAST  TECHNIQUE: Multidetector CT imaging of the chest was performed following the standard protocol without IV contrast.  COMPARISON: Chest CT 01/24/2015.  FINDINGS: Mediastinum/Lymph Nodes: Heart size is normal. There is no significant pericardial fluid, thickening or pericardial calcification. There is atherosclerosis of the thoracic aorta, the great vessels of the mediastinum and the coronary  arteries, including calcified atherosclerotic plaque in the left main, left anterior descending, left circumflex and right coronary arteries. No pathologically enlarged mediastinal or hilar lymph nodes. Please note that accurate exclusion of hilar adenopathy is limited on noncontrast CT scans. Esophagus is unremarkable in appearance. No axillary lymphadenopathy.  Lungs/Pleura: The previously described mixed solid and sub solid nodule in the left upper lobe continues to increase  in size, currently measuring up to 3.1 x 2.3 cm (image 18 of series 3), with a central solid component that measures 10 mm (image 17 of series 2), and remains highly concerning for a slow-growing neoplasm such as a primary pulmonary adenocarcinoma. No other new suspicious appearing pulmonary nodules or masses are noted. No acute consolidative airspace disease. No pleural effusions.  Upper Abdomen: Diffuse low attenuation throughout the hepatic parenchyma, compatible with severe hepatic steatosis. Exophytic 11 mm low-attenuation lesion in the lateral aspect of the upper pole of the right kidney is incompletely characterized, but similar to prior studies, presumably a small cyst. Atherosclerosis.  Musculoskeletal/Soft Tissues: There are no aggressive appearing lytic or blastic lesions noted in the visualized portions of the skeleton.  IMPRESSION: 1. Interval growth of a mixed subsolid and solid nodule in the left upper lobe, as detailed above, which remains highly concerning for primary bronchogenic adenocarcinoma. Surgical resection is strongly suggested. This recommendation follows the consensus statement: Recommendations for the Management of Subsolid Pulmonary Nodules Detected at CT: A Statement from the Fleischner Society as published in Radiology 2013; 266:304-317. 2. Atherosclerosis, including left main and 3 vessel coronary artery disease. Assessment for potential risk factor modification, dietary therapy or pharmacologic therapy may be warranted, if clinically indicated.   Electronically Signed  By: Vinnie Langton M.D.  On: 05/01/2015 15:32    Ref Range 50moago    FVC-Pre L 4.21P   FVC-%Pred-Pre % 104P   FVC-Post L 4.34P   FVC-%Pred-Post % 107P   FVC-%Change-Post % 3P   FEV1-Pre L 3.10P   FEV1-%Pred-Pre % 105P   FEV1-Post L 3.07P   FEV1-%Pred-Post % 104P   FEV1-%Change-Post % 0P   FEV6-Pre L 4.18P   FEV6-%Pred-Pre % 110P   FEV6-Post L  4.30P   FEV6-%Pred-Post % 113P   FEV6-%Change-Post % 2P   Pre FEV1/FVC ratio % 74P   FEV1FVC-%Pred-Pre % 100P   Post FEV1/FVC ratio % 71P   FEV1FVC-%Change-Post % -3P   Pre FEV6/FVC Ratio % 99P   FEV6FVC-%Pred-Pre % 105P   Post FEV6/FVC ratio % 99P   FEV6FVC-%Pred-Post % 105P   FEV6FVC-%Change-Post % 0P   FEF 25-75 Pre L/sec 2.32P   FEF2575-%Pred-Pre % 107P   FEF 25-75 Post L/sec 2.24P   FEF2575-%Pred-Post % 104P   FEF2575-%Change-Post % -3P   RV L 2.75P   RV % pred % 112P   TLC L 6.96P   TLC % pred % 103P   DLCO unc ml/min/mmHg 25.93P   DLCO unc % pred % 85P   DL/VA ml/min/mmHg/L 3.93P   DL/VA % pred % 87P   Resulting Agency [facility]       Specimen Collected: 10/20/14 3:44 PM Last Resulted: 10/20/14 5:05 PM             P=Value has a preliminary status      Scans on Order [id number]       Scan on 10/20/2014 5:06 PM by MBrand Males MD         Impression:  The left upper lobe lesion is slowly growing and is suspicious for a  slowly growing adenocarcinoma given its mixed subsolid and solid appearance and low level hypermetabolic activity on prior PET. Bronchoscopic and ENB biopsies were negative but it has continued to increase in size which is highly suspicious for malignancy. I think the best option is surgical resection which will require a left upper lobectomy. His PFT's are normal. He does have calcific plaque in the left main and all three major coronary vessels as well as the ascending aorta. He was evaluated by Dr. 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 His echo was normal as was his ECG and he has had no recurrence. He denies any chest pain or significant shortness of breath with exertion and is physically active. Given the CT findings I think it would be best to have him evaluated again by Dr. Sallyanne Kuster before proceeding with  a lobectomy. He would like to get his surgery done before Dec 1 for insurance reasons so hopefully any cardiology workup can be done quickly.   Plan:  1. He will be referred to Dr. Sallyanne Kuster for cardiology evaluation preop.  2. Once evaluated by cardiology we will proceed with left upper lobectomy.  I discussed the operative procedure with the patient  including alternatives, benefits and risks; including but not limited to bleeding, blood transfusion, infection, prolonged air leak, pleural space complications, respiratory failure, and recurrence of cancer.  [name] understands and agrees to proceed.     Gaye Pollack, MD [facility] [phone]

## 2015-05-10 ENCOUNTER — Telehealth: Payer: Self-pay | Admitting: *Deleted

## 2015-05-10 ENCOUNTER — Encounter: Payer: Self-pay | Admitting: Cardiovascular Disease

## 2015-05-10 DIAGNOSIS — Z01810 Encounter for preprocedural cardiovascular examination: Secondary | ICD-10-CM

## 2015-05-10 DIAGNOSIS — R55 Syncope and collapse: Secondary | ICD-10-CM

## 2015-05-10 DIAGNOSIS — I10 Essential (primary) hypertension: Secondary | ICD-10-CM

## 2015-05-10 NOTE — Telephone Encounter (Signed)
Dr. Cyndia Bent needs cardiac clearance for lung surgery.  Patient needs everything done prior to 05/24/15.  Exercise Myoview Wednesday 05/16/15 1:15pm.  Patient to arrive by 1pm.  Patient notified and will pick up instructions today.

## 2015-05-14 ENCOUNTER — Encounter: Payer: Self-pay | Admitting: Cardiovascular Disease

## 2015-05-14 ENCOUNTER — Ambulatory Visit (INDEPENDENT_AMBULATORY_CARE_PROVIDER_SITE_OTHER): Payer: BLUE CROSS/BLUE SHIELD | Admitting: Cardiovascular Disease

## 2015-05-14 VITALS — BP 166/90 | HR 71 | Ht 68.0 in | Wt 196.0 lb

## 2015-05-14 DIAGNOSIS — E785 Hyperlipidemia, unspecified: Secondary | ICD-10-CM | POA: Insufficient documentation

## 2015-05-14 DIAGNOSIS — I2510 Atherosclerotic heart disease of native coronary artery without angina pectoris: Secondary | ICD-10-CM | POA: Diagnosis not present

## 2015-05-14 DIAGNOSIS — Z01810 Encounter for preprocedural cardiovascular examination: Secondary | ICD-10-CM | POA: Insufficient documentation

## 2015-05-14 DIAGNOSIS — I10 Essential (primary) hypertension: Secondary | ICD-10-CM

## 2015-05-14 NOTE — Progress Notes (Signed)
Patient ID: George Montoya, male   DOB: 1940-12-15, 74 y.o.   MRN: [id number]      Cardiology Office Note   Date:  05/14/2015   ID:  George Montoya, DOB 1941/06/16, MRN [id number]  PCP:  George Lopes, MD  Cardiologist:   George Klein, MD   Chief Complaint  Patient presents with  . Annual Exam    No complaints.  . cardiac clearance      History of Present Illness: George Montoya is a 74 y.o. male who presents for evaluation of coronary artery calcification prior to undergoing partial pneumonectomy for an enlarging 3.12.3 cm nodule in the left lung upper lobe. a has a history of 25 years smoking 2 packs a day but quit smoking about 30 years ago.  Chest CT also showed evidence of calcified atherosclerotic plaque in the coronary arteries including the left main, LAD, circumflex and right coronary.   he has no symptoms of exertional angina or dyspnea and has not had other cardiovascular problems. He had a evaluation for syncope , likely related to hypovolemia secondary to diarrhea roughly one year ago.  At that time he had an echocardiogram showing normal left ventricular systolic function and wall motion. The lung nodule was identified incidentally during evaluation for back pain which is now which related to scoliosis.    Past Medical History  Diagnosis Date  . High cholesterol   . Hypertension   . Bowel obstruction ([place])   . Melanoma ([place])   . Aortic aneurysm ([facility])     DR BARTLE    JUST WATCHING   . Shortness of breath dyspnea     WITH EXERTION   . GERD (gastroesophageal reflux disease)   . Scoliosis deformity of spine   . Meniere's disease of left ear   . Shingles     04/2014     RIGHT EYE, SCALP    Past Surgical History  Procedure Laterality Date  . Melanoma excision    . Hernia repair    . Appendectomy    . Tonsillectomy    . Cataract extraction w/ intraocular lens  implant, bilateral    . Colon surgery      6 YRS AGO  "SNIPPED BAND BINDING INTESTINE"  .  Video bronchoscopy with endobronchial navigation N/A 10/18/2014    Procedure: VIDEO BRONCHOSCOPY WITH ENDOBRONCHIAL NAVIGATION;  Surgeon: Collene Gobble, MD;  Location: [facility] OR;  Service: Thoracic;  Laterality: N/A;     Current Outpatient Prescriptions  Medication Sig Dispense Refill  . Calcium Carb-Cholecalciferol (CALCIUM 600 + D PO) Take 600 mg by mouth daily.    . carvedilol (COREG) 12.5 MG tablet Take 1 tablet (12.5 mg total) by mouth 2 (two) times daily. 180 tablet 3  . ibuprofen (ADVIL,MOTRIN) 200 MG tablet Take 200 mg by mouth every 6 (six) hours as needed for mild pain or moderate pain.    . Multiple Vitamin (MULTIVITAMIN WITH MINERALS) TABS tablet Take 2 tablets by mouth daily.    Marland Kitchen oxyCODONE-acetaminophen (PERCOCET/ROXICET) 5-325 MG per tablet Take 0.5 tablets by mouth 2 (two) times daily as needed (pain).    . predniSONE (DELTASONE) 20 MG tablet Take 60 mg by mouth See admin instructions. Take 3 tablets (60 mg) daily for 20 days whenever experiencing an episode of hearing loss    . rosuvastatin (CRESTOR) 10 MG tablet Take 5 mg by mouth every Monday, Wednesday, and Friday. In AM    . telmisartan-hydrochlorothiazide (MICARDIS HCT) 40-12.5 MG tablet TAKE 1/2  TABLET DAILY. 15 tablet 10   No current facility-administered medications for this visit.    Allergies:   Dust mite extract    Social History:  The patient  reports that he quit smoking about 33 years ago. His smoking use included Cigarettes. He has a 50 pack-year smoking history. He has never used smokeless tobacco. He reports that he drinks about 12.0 oz of alcohol per week. He reports that he does not use illicit drugs.    ROS:  Please see the history of present illness.    Otherwise, review of systems positive for none.   All other systems are reviewed and negative.    PHYSICAL EXAM: VS:  BP 166/90 mmHg  Pulse 71  Ht '5\' 8"'$  (1.727 m)  Wt 196 lb (88.905 kg)  BMI 29.81 kg/m2 , BMI Body mass index is 29.81  kg/(m^2).  General: Alert, oriented x3, no distress Head: no evidence of trauma, PERRL, EOMI, no exophtalmos or lid lag, no myxedema, no xanthelasma; normal ears, nose and oropharynx Neck: normal jugular venous pulsations and no hepatojugular reflux; brisk carotid pulses without delay and no carotid bruits Chest: clear to auscultation, no signs of consolidation by percussion or palpation, normal fremitus, symmetrical and full respiratory excursions Cardiovascular: normal position and quality of the apical impulse, regular rhythm, normal first and second heart sounds, no murmurs, rubs or gallops Abdomen: no tenderness or distention, no masses by palpation, no abnormal pulsatility or arterial bruits, normal bowel sounds, no hepatosplenomegaly Extremities: no clubbing, cyanosis or edema; 2+ radial, ulnar and brachial pulses bilaterally; 2+ right femoral, posterior tibial and dorsalis pedis pulses; 2+ left femoral, posterior tibial and dorsalis pedis pulses; no subclavian or femoral bruits Neurological: grossly nonfocal Psych: euthymic mood, full affect   EKG:  EKG is ordered today. The ekg ordered today demonstrates  I will sinus rhythm, very minor nonspecific ST-T changes in V5-V6   Recent Labs: 10/13/2014: ALT 66*; Hemoglobin 13.7; Platelets 189 10/18/2014: BUN 8; Creatinine, Ser 0.87; Potassium 3.6; Sodium 129*    Lipid Panel No results found for: CHOL, TRIG, HDL, CHOLHDL, VLDL, LDLCALC, LDLDIRECT    Wt Readings from Last 3 Encounters:  05/14/15 196 lb (88.905 kg)  05/09/15 195 lb (88.451 kg)  05/03/15 195 lb (88.451 kg)      Other studies Reviewed: Additional studies/ records that were reviewed today include:  CT chest, notes from Dr. Gilford Raid and pulmonary team.   ASSESSMENT AND PLAN:  1.  Asymptomatic extensive coronary artery calcifications. He does not have angina pectoris despite being reasonably active (for example playing golf). He does have some coronary risk factors  , but not particularly prominent ones (ex-smoker, quit more than 30 years ago; hypertension).  A functional study is appropriate. Will order a treadmill Myoview.  Currently, this is scheduled for Wednesday. If abnormal he should undergo coronary angiography.  He strongly have his workup and surgery performed before December 1 due to insurance issues.  he is already receiving beta blocker , statin and angiotensin receptor blocker therapy. After surgery start aspirin.  2.  Hypertension well controlled.  Usually his blood pressure substantially lower than it was a day, but he is clearly nervous about the need for lung surgery and possible diagnosis of lung cancer. Typically, at home his blood pressure is 130s/80s.  3.  Regardless of whether or not he has significant coronary stenoses, he needs to remain on statin therapy with target LDL less than 100 mg/deciliter, preferably less than 70 milligrams/deciliter.  I think that overall it is very likely that he is at low risk for major cardiovascular complication with the planned lung surgery. If he does not have a large reversible defect on his nuclear stress test, would proceed to surgery without delay.  Current medicines are reviewed at length with the patient today.  The patient does not have concerns regarding medicines.  The following changes have been made:  no change  Labs/ tests ordered today include:  No orders of the defined types were placed in this encounter.     Patient Instructions  Dr. Sallyanne Kuster recommends that you schedule a follow-up appointment in: one year     Signed, George Klein, MD  05/14/2015 5:33 PM    George Klein, MD, [facility] [phone] office [phone] pager

## 2015-05-14 NOTE — Patient Instructions (Signed)
Dr. Sallyanne Kuster recommends that you schedule a follow-up appointment in: one year

## 2015-05-15 ENCOUNTER — Telehealth (HOSPITAL_COMMUNITY): Payer: Self-pay

## 2015-05-15 ENCOUNTER — Other Ambulatory Visit: Payer: Self-pay | Admitting: *Deleted

## 2015-05-15 DIAGNOSIS — R911 Solitary pulmonary nodule: Secondary | ICD-10-CM

## 2015-05-15 NOTE — Telephone Encounter (Signed)
Encounter complete. 

## 2015-05-15 NOTE — Progress Notes (Addendum)
Anesthesia Chart Review: Patient is a 74 year old male scheduled for left thoracotomy, LU lobectomy on 05/21/15 by Dr. Cyndia Bent. Had bronchoscopy 09/2014 after incidental finding of LUL lung nodule on CT 08/22/14. Pathology showed focal atypia, but not diagnostic for malignancy, but follow-up CT showed increased nodule size. PET scan 09/14/14 showed the nodule as weakly hypermetabolic. Lung resection is now recommended.   History includes former smoker, HTN, melanoma in-situ resection from right shoulder ~ 2012, 4.1 cm fusiform ascending aortic aneurysm (08/22/14 CT by notes; followed by Dr. Cyndia Bent with one year follow-up recommended), scoliosis, hypercholesterolemia, Meniere's disease (left), hernia repair, bowel obstruction treated with what sounds like lysis of adhesions, video bronchoscopy 10/18/14.   PCP is Dr. Leanna Battles.  Pulmonologist is Dr. Chase Caller. Cardiologist is Dr. Sallyanne Kuster. Seen initially in 2015 for syncope in the setting of acute GI illness with hypovolemia. Re-established 05/14/15 for pre-operative clearance with recent chest CT showing evidence of coronary calcifications. Patient is getting a nuclear stress test on 05/16/15. Clearance status to be determined at that time, but Dr. Sallyanne Kuster wrote, " I think that overall it is very likely that he is at low risk for major cardiovascular complication with the planned lung surgery. If he does not have a large reversible defect on his nuclear stress test, would proceed to surgery without delay."   Meds include Calcium, Coreg, prednisone, Crestor, Micardis HCT.  05/14/15: NSR, minor non-specific ST changes V5-6.   05/16/15 Nuclear stress test:  The left ventricular ejection fraction is normal (55-65%).  Nuclear stress EF: 63%.  Blood pressure demonstrated a hypertensive response to exercise.  Upsloping ST segment depression ST segment depression of 1 mm was noted during stress in the III, II and aVF leads.  This is a low risk  study. Normal perfusion. LVEF 63% with normal wall motion. Fair exercise tolerance without chest pain. Hypertensive response to exercise. This is a low risk study.  03/29/14 Echo: - Left ventricle: The cavity size was normal. Wall thickness was normal. Systolic function was vigorous. The estimated ejection fraction was in the range of 65% to 70%. Wall motion was normal; there were no regional wall motion abnormalities. Doppler parameters are consistent with abnormal left ventricular relaxation (grade 1 diastolic dysfunction). The E/e/ ratio is <8, suggesting normal LV filling pressure. - Aortic valve: Structurally normal valve. Trileaflet. There was trivial regurgitation. - Mitral valve: Mildly thickened leaflets . There was trivial regurgitation. - Left atrium: LA Volume/BSA= 26.4 ml/m2. The atrium was normal in size. Impressions: LVEF 65-70%, normal wall thickness, normal chamber sizes,diastolic dysfunction, normal LV filling pressure. Trivial AI andMR.  05/01/15 Chest CT w/o contrast: IMPRESSION: 1. Interval growth of a mixed subsolid and solid nodule in the left upper lobe, as detailed above, which remains highly concerning for primary bronchogenic adenocarcinoma. Surgical resection is strongly suggested.  2. Atherosclerosis, including left main and 3 vessel coronary artery disease. Assessment for potential risk factor modification, dietary therapy or pharmacologic therapy may be warranted, if clinically Indicated.  He is for CXR on the day of surgery.  Preoperative labs noted. Na 129, glucose 132. H/H 13.7/38.4. WBC 8.3. PT/PTT WNL. His Na has been chronically low when compared to labs from 01/2014 in Epic ([phone]). Will check ISTAT4 on arrival to re-evaluate for stability. He is on HCTZ and has a lung mass which could be contributing.  He has had some sinus congestion of the fast few days, but feels it's much better now. No fevers. If follow-up labs are stable and  no acute  fever/respiratory symptoms by his surgery date then I would anticipate that he could proceed as planned.  George Hugh [facility] Short Stay Center/Anesthesiology Phone [phone] 05/16/2015 4:30 PM

## 2015-05-16 ENCOUNTER — Encounter (HOSPITAL_COMMUNITY): Payer: Self-pay

## 2015-05-16 ENCOUNTER — Other Ambulatory Visit (HOSPITAL_COMMUNITY): Payer: Self-pay | Admitting: *Deleted

## 2015-05-16 ENCOUNTER — Encounter (HOSPITAL_COMMUNITY)
Admission: RE | Admit: 2015-05-16 | Discharge: 2015-05-16 | Disposition: A | Discharge status: Home / Self Care | Payer: BLUE CROSS/BLUE SHIELD | Source: Ambulatory Visit | Attending: Surgery | Admitting: Surgery

## 2015-05-16 ENCOUNTER — Ambulatory Visit (HOSPITAL_COMMUNITY)
Admission: RE | Admit: 2015-05-16 | Discharge: 2015-05-16 | Disposition: A | Discharge status: Home / Self Care | Payer: BLUE CROSS/BLUE SHIELD | Source: Ambulatory Visit | Attending: Cardiovascular Disease | Admitting: Cardiovascular Disease

## 2015-05-16 VITALS — BP 153/78 | HR 87 | Temp 97.9°F | Resp 20 | Ht 68.0 in | Wt 195.2 lb

## 2015-05-16 DIAGNOSIS — R55 Syncope and collapse: Secondary | ICD-10-CM | POA: Insufficient documentation

## 2015-05-16 DIAGNOSIS — R911 Solitary pulmonary nodule: Secondary | ICD-10-CM

## 2015-05-16 DIAGNOSIS — I719 Aortic aneurysm of unspecified site, without rupture: Secondary | ICD-10-CM | POA: Diagnosis not present

## 2015-05-16 DIAGNOSIS — Z87891 Personal history of nicotine dependence: Secondary | ICD-10-CM | POA: Diagnosis not present

## 2015-05-16 DIAGNOSIS — Z01812 Encounter for preprocedural laboratory examination: Secondary | ICD-10-CM | POA: Diagnosis not present

## 2015-05-16 DIAGNOSIS — Z01810 Encounter for preprocedural cardiovascular examination: Secondary | ICD-10-CM | POA: Diagnosis not present

## 2015-05-16 DIAGNOSIS — I10 Essential (primary) hypertension: Secondary | ICD-10-CM | POA: Insufficient documentation

## 2015-05-16 HISTORY — DX: Unspecified osteoarthritis, unspecified site: M19.90

## 2015-05-16 HISTORY — DX: Sleep related leg cramps: G47.62

## 2015-05-16 HISTORY — DX: Headache: R51

## 2015-05-16 HISTORY — DX: Syncope and collapse: R55

## 2015-05-16 HISTORY — DX: Headache, unspecified: R51.9

## 2015-05-16 LAB — MYOCARDIAL PERFUSION IMAGING
CHL CUP STRESS STAGE 1 GRADE: 0 %
CHL CUP STRESS STAGE 1 HR: 88 {beats}/min
CHL CUP STRESS STAGE 1 SBP: 190 mmHg
CHL CUP STRESS STAGE 1 SPEED: 0 mph
CHL CUP STRESS STAGE 2 GRADE: 0 %
CHL CUP STRESS STAGE 2 HR: 82 {beats}/min
CHL CUP STRESS STAGE 3 HR: 81 {beats}/min
CHL CUP STRESS STAGE 4 HR: 123 {beats}/min
CHL CUP STRESS STAGE 5 SBP: 216 mmHg
CHL CUP STRESS STAGE 5 SPEED: 2.5 mph
CHL CUP STRESS STAGE 6 GRADE: 0 %
CHL CUP STRESS STAGE 6 HR: 115 {beats}/min
CHL CUP STRESS STAGE 6 SPEED: 0 mph
CHL CUP STRESS STAGE 7 DBP: 107 mmHg
CHL CUP STRESS STAGE 7 HR: 85 {beats}/min
CHL CUP STRESS STAGE 7 SBP: 211 mmHg
CHL RATE OF PERCEIVED EXERTION: 15
CSEPPBP: 216 mmHg
CSEPPHR: 141 {beats}/min
CSEPPMHR: 96 %
Estimated workload: 7 METS
Exercise duration (min): 5 min
LVDIAVOL: 105 mL
LVSYSVOL: 39 mL
MPHR: 146 {beats}/min
NUC STRESS TID: 1.05
Percent HR: 97 %
Rest HR: 82 {beats}/min
SDS: 2
SRS: 1
SSS: 3
Stage 1 DBP: 93 mmHg
Stage 2 Speed: 0.9 mph
Stage 3 Grade: 0 %
Stage 3 Speed: 1 mph
Stage 4 DBP: 106 mmHg
Stage 4 Grade: 10 %
Stage 4 SBP: 206 mmHg
Stage 4 Speed: 1.7 mph
Stage 5 DBP: 109 mmHg
Stage 5 Grade: 12 %
Stage 5 HR: 141 {beats}/min
Stage 6 DBP: 109 mmHg
Stage 6 SBP: 201 mmHg
Stage 7 Grade: 0 %
Stage 7 Speed: 0 mph

## 2015-05-16 LAB — COMPREHENSIVE METABOLIC PANEL
ALT: 32 U/L (ref 17–63)
AST: 35 U/L (ref 15–41)
Albumin: 4.1 g/dL (ref 3.5–5.0)
Alkaline Phosphatase: 54 U/L (ref 38–126)
Anion gap: 13 (ref 5–15)
BUN: 8 mg/dL (ref 6–20)
CHLORIDE: 96 mmol/L — AB (ref 101–111)
CO2: 20 mmol/L — AB (ref 22–32)
Calcium: 9.9 mg/dL (ref 8.9–10.3)
Creatinine, Ser: 0.73 mg/dL (ref 0.61–1.24)
Glucose, Bld: 132 mg/dL — ABNORMAL HIGH (ref 65–99)
POTASSIUM: 3.9 mmol/L (ref 3.5–5.1)
SODIUM: 129 mmol/L — AB (ref 135–145)
Total Bilirubin: 0.6 mg/dL (ref 0.3–1.2)
Total Protein: 7 g/dL (ref 6.5–8.1)

## 2015-05-16 LAB — BLOOD GAS, ARTERIAL
ACID-BASE DEFICIT: 1.2 mmol/L (ref 0.0–2.0)
Bicarbonate: 22.2 mEq/L (ref 20.0–24.0)
DRAWN BY: 42180
FIO2: 0.21
O2 SAT: 98.7 %
PATIENT TEMPERATURE: 98.6
PCO2 ART: 32.3 mmHg — AB (ref 35.0–45.0)
TCO2: 23.2 mmol/L (ref 0–100)
pH, Arterial: 7.452 — ABNORMAL HIGH (ref 7.350–7.450)
pO2, Arterial: 118 mmHg — ABNORMAL HIGH (ref 80.0–100.0)

## 2015-05-16 LAB — CBC
HCT: 38.4 % — ABNORMAL LOW (ref 39.0–52.0)
Hemoglobin: 13.7 g/dL (ref 13.0–17.0)
MCH: 35.1 pg — ABNORMAL HIGH (ref 26.0–34.0)
MCHC: 35.7 g/dL (ref 30.0–36.0)
MCV: 98.5 fL (ref 78.0–100.0)
PLATELETS: 194 10*3/uL (ref 150–400)
RBC: 3.9 MIL/uL — ABNORMAL LOW (ref 4.22–5.81)
RDW: 11.6 % (ref 11.5–15.5)
WBC: 8.3 10*3/uL (ref 4.0–10.5)

## 2015-05-16 LAB — URINALYSIS, ROUTINE W REFLEX MICROSCOPIC
BILIRUBIN URINE: NEGATIVE
GLUCOSE, UA: NEGATIVE mg/dL
KETONES UR: NEGATIVE mg/dL
Leukocytes, UA: NEGATIVE
Nitrite: NEGATIVE
PH: 6.5 (ref 5.0–8.0)
Protein, ur: NEGATIVE mg/dL
Specific Gravity, Urine: 1.013 (ref 1.005–1.030)

## 2015-05-16 LAB — URINE MICROSCOPIC-ADD ON

## 2015-05-16 LAB — ABO/RH: ABO/RH(D): A POS

## 2015-05-16 LAB — PROTIME-INR
INR: 1.02 (ref 0.00–1.49)
PROTHROMBIN TIME: 13.6 s (ref 11.6–15.2)

## 2015-05-16 LAB — SURGICAL PCR SCREEN
MRSA, PCR: NEGATIVE
STAPHYLOCOCCUS AUREUS: POSITIVE — AB

## 2015-05-16 LAB — APTT: APTT: 32 s (ref 24–37)

## 2015-05-16 MED ORDER — TECHNETIUM TC 99M SESTAMIBI GENERIC - CARDIOLITE
10.9000 | Freq: Once | INTRAVENOUS | Status: AC | PRN
  Administered 2015-05-16: 10.9 via INTRAVENOUS

## 2015-05-16 MED ORDER — TECHNETIUM TC 99M SESTAMIBI GENERIC - CARDIOLITE
31.9000 | Freq: Once | INTRAVENOUS | Status: AC | PRN
  Administered 2015-05-16: 31.9 via INTRAVENOUS

## 2015-05-16 NOTE — Pre-Procedure Instructions (Signed)
George Montoya.  05/16/2015      Your procedure is scheduled on Monday, May 21, 2015 at 7:30 AM.   Report to [facility] Entrance "A" Admitting Office at 5:30 AM.   Call this number if you have problems the morning of surgery: [phone]    Remember:  Do not eat food or drink liquids after midnight Sunday, 05/20/15.  Take these medicines the morning of surgery with A SIP OF WATER: Carvedilol (Coreg), Oxycodone - if needed  Stop Multivitamins and NSAIDS (Ibuprofen, Motrin, Aleve, etc.) as of today.   Do not wear jewelry.  Do not wear lotions, powders, or cologne.  You may NOT wear deodorant.  Men may shave face and neck.  Do not bring valuables to the hospital.  [facility] is not responsible for any belongings or valuables.  Contacts, dentures or bridgework may not be worn into surgery.  Leave your suitcase in the car.  After surgery it may be brought to your room.  For patients admitted to the hospital, discharge time will be determined by your treatment team.  Special instructions:  [facility] - Preparing for Surgery  Before surgery, you can play an important role.  Because skin is not sterile, your skin needs to be as free of germs as possible.  You can reduce the number of germs on you skin by washing with CHG (chlorahexidine gluconate) soap before surgery.  CHG is an antiseptic cleaner which kills germs and bonds with the skin to continue killing germs even after washing.  Please DO NOT use if you have an allergy to CHG or antibacterial soaps.  If your skin becomes reddened/irritated stop using the CHG and inform your nurse when you arrive at Short Stay.  Do not shave (including legs and underarms) for at least 48 hours prior to the first CHG shower.  You may shave your face.  Please follow these instructions carefully:   1.  Shower with CHG Soap the night before surgery and the                                morning of Surgery.  2.  If you choose to  wash your hair, wash your hair first as usual with your       normal shampoo.  3.  After you shampoo, rinse your hair and body thoroughly to remove the                      Shampoo.  4.  Use CHG as you would any other liquid soap.  You can apply chg directly       to the skin and wash gently with scrungie or a clean washcloth.  5.  Apply the CHG Soap to your body ONLY FROM THE NECK DOWN.        Do not use on open wounds or open sores.  Avoid contact with your eyes, ears, mouth and genitals (private parts).  Wash genitals (private parts) with your normal soap.  6.  Wash thoroughly, paying special attention to the area where your surgery        will be performed.  7.  Thoroughly rinse your body with warm water from the neck down.  8.  DO NOT shower/wash with your normal soap after using and rinsing off       the CHG Soap.  9.  Pat yourself  dry with a clean towel.            10.  Wear clean pajamas.            11.  Place clean sheets on your bed the night of your first shower and do not        sleep with pets.  Day of Surgery  Do not apply any lotions/deodorants the morning of surgery.  Please wear clean clothes to the hospital.   Please read over the following fact sheets that you were given. Pain Booklet, Coughing and Deep Breathing, Blood Transfusion Information, MRSA Information and Surgical Site Infection Prevention

## 2015-05-16 NOTE — Progress Notes (Signed)
Pt to have Nuclear medicine stress test today, will get final clearance after test.  Pt states he's had sinus congestion and runny nose for past several days, states it's much better, but still having some nosebleeds when he blows his nose. States it's not a "gushing" nosebleed, but it can "drip" out if he doesn't keep a tissue in his nose. States it usually only occurs from right nostril but today the left nostril has been bleeding. He denies any fever. States he does have post nasal drainage and coughs up "clear mucus".   Echo - 03/29/14 (done after having a syncopal episode).

## 2015-05-16 NOTE — Progress Notes (Signed)
Mupirocin Ointment Rx called into [facility] for positive PCR of Staph. Pt notified and voiced understanding.

## 2015-05-20 MED ORDER — DEXTROSE 5 % IV SOLN
1.5000 g | INTRAVENOUS | Status: AC
  Administered 2015-05-21: 1.5 g via INTRAVENOUS
  Filled 2015-05-20: qty 1.5

## 2015-05-21 ENCOUNTER — Inpatient Hospital Stay (HOSPITAL_COMMUNITY)
Admission: RE | Admit: 2015-05-21 | Discharge: 2015-05-25 | DRG: 164 | Disposition: A | Discharge status: Home / Self Care | Payer: BLUE CROSS/BLUE SHIELD | Source: Ambulatory Visit | Attending: Surgery | Admitting: Surgery

## 2015-05-21 ENCOUNTER — Inpatient Hospital Stay (HOSPITAL_COMMUNITY): Payer: BLUE CROSS/BLUE SHIELD

## 2015-05-21 ENCOUNTER — Encounter (HOSPITAL_COMMUNITY)
Admission: RE | Disposition: A | Discharge status: Home / Self Care | Payer: Self-pay | Source: Ambulatory Visit | Attending: Surgery

## 2015-05-21 ENCOUNTER — Inpatient Hospital Stay (HOSPITAL_COMMUNITY): Payer: BLUE CROSS/BLUE SHIELD | Admitting: Certified Registered Nurse Anesthetist

## 2015-05-21 ENCOUNTER — Inpatient Hospital Stay (HOSPITAL_COMMUNITY): Payer: BLUE CROSS/BLUE SHIELD | Admitting: Vascular Surgery

## 2015-05-21 ENCOUNTER — Encounter (HOSPITAL_COMMUNITY): Payer: Self-pay | Admitting: General Practice

## 2015-05-21 DIAGNOSIS — M545 Low back pain: Secondary | ICD-10-CM | POA: Diagnosis present

## 2015-05-21 DIAGNOSIS — C3412 Malignant neoplasm of upper lobe, left bronchus or lung: Principal | ICD-10-CM | POA: Diagnosis present

## 2015-05-21 DIAGNOSIS — R911 Solitary pulmonary nodule: Secondary | ICD-10-CM | POA: Diagnosis present

## 2015-05-21 DIAGNOSIS — Z801 Family history of malignant neoplasm of trachea, bronchus and lung: Secondary | ICD-10-CM

## 2015-05-21 DIAGNOSIS — Z23 Encounter for immunization: Secondary | ICD-10-CM

## 2015-05-21 DIAGNOSIS — J95811 Postprocedural pneumothorax: Secondary | ICD-10-CM | POA: Diagnosis not present

## 2015-05-21 DIAGNOSIS — I712 Thoracic aortic aneurysm, without rupture: Secondary | ICD-10-CM | POA: Diagnosis present

## 2015-05-21 DIAGNOSIS — I700 Atherosclerosis of aorta: Secondary | ICD-10-CM | POA: Diagnosis present

## 2015-05-21 DIAGNOSIS — E785 Hyperlipidemia, unspecified: Secondary | ICD-10-CM | POA: Diagnosis present

## 2015-05-21 DIAGNOSIS — Z85820 Personal history of malignant melanoma of skin: Secondary | ICD-10-CM

## 2015-05-21 DIAGNOSIS — Z902 Acquired absence of lung [part of]: Secondary | ICD-10-CM

## 2015-05-21 DIAGNOSIS — T8182XA Emphysema (subcutaneous) resulting from a procedure, initial encounter: Secondary | ICD-10-CM | POA: Diagnosis not present

## 2015-05-21 DIAGNOSIS — Z8042 Family history of malignant neoplasm of prostate: Secondary | ICD-10-CM

## 2015-05-21 DIAGNOSIS — I10 Essential (primary) hypertension: Secondary | ICD-10-CM | POA: Diagnosis present

## 2015-05-21 DIAGNOSIS — Z87891 Personal history of nicotine dependence: Secondary | ICD-10-CM

## 2015-05-21 HISTORY — PX: THORACOTOMY/LOBECTOMY: SHX6116

## 2015-05-21 LAB — POCT I-STAT 4, (NA,K, GLUC, HGB,HCT)
GLUCOSE: 112 mg/dL — AB (ref 65–99)
HEMATOCRIT: 40 % (ref 39.0–52.0)
HEMOGLOBIN: 13.6 g/dL (ref 13.0–17.0)
POTASSIUM: 3.5 mmol/L (ref 3.5–5.1)
SODIUM: 128 mmol/L — AB (ref 135–145)

## 2015-05-21 LAB — GLUCOSE, CAPILLARY
Glucose-Capillary: 101 mg/dL — ABNORMAL HIGH (ref 65–99)
Glucose-Capillary: 233 mg/dL — ABNORMAL HIGH (ref 65–99)

## 2015-05-21 LAB — POCT I-STAT 7, (LYTES, BLD GAS, ICA,H+H)
ACID-BASE DEFICIT: 2 mmol/L (ref 0.0–2.0)
BICARBONATE: 23.6 meq/L (ref 20.0–24.0)
CALCIUM ION: 1.22 mmol/L (ref 1.13–1.30)
HCT: 35 % — ABNORMAL LOW (ref 39.0–52.0)
Hemoglobin: 11.9 g/dL — ABNORMAL LOW (ref 13.0–17.0)
O2 SAT: 99 %
PH ART: 7.378 (ref 7.350–7.450)
PO2 ART: 141 mmHg — AB (ref 80.0–100.0)
Potassium: 3.7 mmol/L (ref 3.5–5.1)
Sodium: 127 mmol/L — ABNORMAL LOW (ref 135–145)
TCO2: 25 mmol/L (ref 0–100)
pCO2 arterial: 39 mmHg (ref 35.0–45.0)

## 2015-05-21 LAB — PREPARE RBC (CROSSMATCH)

## 2015-05-21 SURGERY — LOBECTOMY, LUNG, OPEN
Anesthesia: General | Site: Chest | Laterality: Left

## 2015-05-21 MED ORDER — BISACODYL 5 MG PO TBEC
10.0000 mg | DELAYED_RELEASE_TABLET | Freq: Every day | ORAL | Status: DC
  Administered 2015-05-21 – 2015-05-22 (×2): 10 mg via ORAL
  Filled 2015-05-21 (×3): qty 2

## 2015-05-21 MED ORDER — MIDAZOLAM HCL 5 MG/5ML IJ SOLN
INTRAMUSCULAR | Status: DC | PRN
  Administered 2015-05-21: 2 mg via INTRAVENOUS

## 2015-05-21 MED ORDER — 0.9 % SODIUM CHLORIDE (POUR BTL) OPTIME
TOPICAL | Status: DC | PRN
  Administered 2015-05-21: 1000 mL

## 2015-05-21 MED ORDER — ONDANSETRON HCL 4 MG/2ML IJ SOLN: 4.0000 mg | Freq: Four times a day (QID) | INTRAMUSCULAR | Status: DC | PRN

## 2015-05-21 MED ORDER — POTASSIUM CHLORIDE 10 MEQ/50ML IV SOLN: 10.0000 meq | Freq: Every day | INTRAVENOUS | Status: DC | PRN

## 2015-05-21 MED ORDER — BUPIVACAINE 0.5 % ON-Q PUMP SINGLE CATH 400 ML
400.0000 mL | INJECTION | Status: DC
  Administered 2015-05-21: 400 mL
  Filled 2015-05-21: qty 400

## 2015-05-21 MED ORDER — PHENYLEPHRINE 40 MCG/ML (10ML) SYRINGE FOR IV PUSH (FOR BLOOD PRESSURE SUPPORT)
PREFILLED_SYRINGE | INTRAVENOUS | Status: AC
  Filled 2015-05-21: qty 10

## 2015-05-21 MED ORDER — OXYCODONE HCL 5 MG PO TABS: 5.0000 mg | ORAL_TABLET | Freq: Once | ORAL | Status: DC | PRN

## 2015-05-21 MED ORDER — GLYCOPYRROLATE 0.2 MG/ML IJ SOLN
INTRAMUSCULAR | Status: AC
  Filled 2015-05-21: qty 1

## 2015-05-21 MED ORDER — FENTANYL CITRATE (PF) 100 MCG/2ML IJ SOLN
INTRAMUSCULAR | Status: AC
  Filled 2015-05-21: qty 2

## 2015-05-21 MED ORDER — ROSUVASTATIN CALCIUM 5 MG PO TABS
5.0000 mg | ORAL_TABLET | ORAL | Status: DC
  Administered 2015-05-21 – 2015-05-25 (×3): 5 mg via ORAL
  Filled 2015-05-21 (×3): qty 1

## 2015-05-21 MED ORDER — ROCURONIUM BROMIDE 100 MG/10ML IV SOLN
INTRAVENOUS | Status: DC | PRN
  Administered 2015-05-21: 50 mg via INTRAVENOUS
  Administered 2015-05-21: 20 mg via INTRAVENOUS
  Administered 2015-05-21: 10 mg via INTRAVENOUS

## 2015-05-21 MED ORDER — LACTATED RINGERS IV SOLN
INTRAVENOUS | Status: DC | PRN
  Administered 2015-05-21: 07:00:00 via INTRAVENOUS

## 2015-05-21 MED ORDER — BUPIVACAINE HCL (PF) 0.5 % IJ SOLN
INTRAMUSCULAR | Status: AC
  Filled 2015-05-21: qty 10

## 2015-05-21 MED ORDER — MUPIROCIN 2 % EX OINT
1.0000 "application " | TOPICAL_OINTMENT | Freq: Two times a day (BID) | CUTANEOUS | Status: DC
  Filled 2015-05-21: qty 22

## 2015-05-21 MED ORDER — IRBESARTAN 150 MG PO TABS
150.0000 mg | ORAL_TABLET | Freq: Every day | ORAL | Status: DC
  Administered 2015-05-22 – 2015-05-25 (×4): 150 mg via ORAL
  Filled 2015-05-21 (×4): qty 1

## 2015-05-21 MED ORDER — ONDANSETRON HCL 4 MG/2ML IJ SOLN
INTRAMUSCULAR | Status: DC | PRN
  Administered 2015-05-21: 4 mg via INTRAVENOUS

## 2015-05-21 MED ORDER — LIDOCAINE HCL (CARDIAC) 20 MG/ML IV SOLN
INTRAVENOUS | Status: AC
  Filled 2015-05-21: qty 10

## 2015-05-21 MED ORDER — PROPOFOL 10 MG/ML IV BOLUS
INTRAVENOUS | Status: AC
  Filled 2015-05-21: qty 40

## 2015-05-21 MED ORDER — ONDANSETRON HCL 4 MG/2ML IJ SOLN: 4.0000 mg | Freq: Once | INTRAMUSCULAR | Status: DC | PRN

## 2015-05-21 MED ORDER — SENNOSIDES-DOCUSATE SODIUM 8.6-50 MG PO TABS
1.0000 | ORAL_TABLET | Freq: Every day | ORAL | Status: DC
  Administered 2015-05-21 – 2015-05-22 (×2): 1 via ORAL
  Filled 2015-05-21 (×4): qty 1

## 2015-05-21 MED ORDER — ACETAMINOPHEN 500 MG PO TABS
1000.0000 mg | ORAL_TABLET | Freq: Four times a day (QID) | ORAL | Status: DC
  Administered 2015-05-21 – 2015-05-23 (×6): 1000 mg via ORAL
  Filled 2015-05-21 (×6): qty 2

## 2015-05-21 MED ORDER — NALOXONE HCL 0.4 MG/ML IJ SOLN: 0.4000 mg | INTRAMUSCULAR | Status: DC | PRN

## 2015-05-21 MED ORDER — PNEUMOCOCCAL VAC POLYVALENT 25 MCG/0.5ML IJ INJ: 0.5000 mL | INJECTION | INTRAMUSCULAR | Status: DC | PRN

## 2015-05-21 MED ORDER — GLYCOPYRROLATE 0.2 MG/ML IJ SOLN
INTRAMUSCULAR | Status: DC | PRN
  Administered 2015-05-21: .2 mg via INTRAVENOUS
  Administered 2015-05-21: .3 mg via INTRAVENOUS

## 2015-05-21 MED ORDER — ACETAMINOPHEN 160 MG/5ML PO SOLN
1000.0000 mg | Freq: Four times a day (QID) | ORAL | Status: DC
  Administered 2015-05-21 (×2): 1000 mg via ORAL
  Filled 2015-05-21 (×2): qty 40.6

## 2015-05-21 MED ORDER — ONDANSETRON HCL 4 MG/2ML IJ SOLN
INTRAMUSCULAR | Status: AC
  Filled 2015-05-21: qty 2

## 2015-05-21 MED ORDER — BUPIVACAINE ON-Q PAIN PUMP (FOR ORDER SET NO CHG)
INJECTION | Status: AC
Start: 2015-05-21 — End: 2015-05-24
  Filled 2015-05-21: qty 1

## 2015-05-21 MED ORDER — ROCURONIUM BROMIDE 50 MG/5ML IV SOLN
INTRAVENOUS | Status: AC
  Filled 2015-05-21: qty 1

## 2015-05-21 MED ORDER — NEOSTIGMINE METHYLSULFATE 10 MG/10ML IV SOLN
INTRAVENOUS | Status: DC | PRN
  Administered 2015-05-21: 2 mg via INTRAVENOUS

## 2015-05-21 MED ORDER — TRAMADOL HCL 50 MG PO TABS
50.0000 mg | ORAL_TABLET | Freq: Four times a day (QID) | ORAL | Status: DC | PRN
  Administered 2015-05-22: 100 mg via ORAL
  Filled 2015-05-21: qty 2

## 2015-05-21 MED ORDER — EPHEDRINE SULFATE 50 MG/ML IJ SOLN
INTRAMUSCULAR | Status: AC
  Filled 2015-05-21: qty 1

## 2015-05-21 MED ORDER — FENTANYL CITRATE (PF) 100 MCG/2ML IJ SOLN
INTRAMUSCULAR | Status: DC | PRN
  Administered 2015-05-21: 100 ug via INTRAVENOUS
  Administered 2015-05-21 (×6): 50 ug via INTRAVENOUS

## 2015-05-21 MED ORDER — DEXTROSE 5 % IV SOLN
1.5000 g | Freq: Two times a day (BID) | INTRAVENOUS | Status: AC
  Administered 2015-05-21 – 2015-05-22 (×2): 1.5 g via INTRAVENOUS
  Filled 2015-05-21 (×2): qty 1.5

## 2015-05-21 MED ORDER — SODIUM CHLORIDE 0.9 % IV SOLN: Freq: Once | INTRAVENOUS | Status: DC

## 2015-05-21 MED ORDER — SODIUM CHLORIDE 0.9 % IJ SOLN: 9.0000 mL | INTRAMUSCULAR | Status: DC | PRN

## 2015-05-21 MED ORDER — KCL IN DEXTROSE-NACL 20-5-0.9 MEQ/L-%-% IV SOLN
INTRAVENOUS | Status: DC
  Administered 2015-05-21 – 2015-05-22 (×2): via INTRAVENOUS
  Filled 2015-05-21 (×7): qty 1000

## 2015-05-21 MED ORDER — DIPHENHYDRAMINE HCL 12.5 MG/5ML PO ELIX: 12.5000 mg | ORAL_SOLUTION | Freq: Four times a day (QID) | ORAL | Status: DC | PRN

## 2015-05-21 MED ORDER — FENTANYL 40 MCG/ML IV SOLN
INTRAVENOUS | Status: DC
  Administered 2015-05-21: 210 ug via INTRAVENOUS
  Administered 2015-05-21: 165 ug via INTRAVENOUS
  Administered 2015-05-21: 15 ug via INTRAVENOUS
  Administered 2015-05-22 (×2): 90 ug via INTRAVENOUS
  Administered 2015-05-22: 75 ug via INTRAVENOUS
  Administered 2015-05-22: 60 ug via INTRAVENOUS
  Filled 2015-05-21 (×2): qty 25

## 2015-05-21 MED ORDER — LIDOCAINE HCL (CARDIAC) 20 MG/ML IV SOLN
INTRAVENOUS | Status: DC | PRN
  Administered 2015-05-21: 40 mg via INTRAVENOUS

## 2015-05-21 MED ORDER — SODIUM CHLORIDE 0.9 % IJ SOLN
INTRAMUSCULAR | Status: AC
  Filled 2015-05-21: qty 10

## 2015-05-21 MED ORDER — TELMISARTAN-HCTZ 40-12.5 MG PO TABS: 0.5000 | ORAL_TABLET | Freq: Every day | ORAL | Status: DC

## 2015-05-21 MED ORDER — OXYCODONE HCL 5 MG/5ML PO SOLN: 5.0000 mg | Freq: Once | ORAL | Status: DC | PRN

## 2015-05-21 MED ORDER — LEVALBUTEROL HCL 0.63 MG/3ML IN NEBU
0.6300 mg | INHALATION_SOLUTION | Freq: Four times a day (QID) | RESPIRATORY_TRACT | Status: DC
  Administered 2015-05-21 – 2015-05-23 (×6): 0.63 mg via RESPIRATORY_TRACT
  Filled 2015-05-21 (×11): qty 3

## 2015-05-21 MED ORDER — METOCLOPRAMIDE HCL 5 MG/ML IJ SOLN
10.0000 mg | Freq: Four times a day (QID) | INTRAMUSCULAR | Status: DC
  Administered 2015-05-21 – 2015-05-22 (×6): 10 mg via INTRAVENOUS
  Filled 2015-05-21 (×8): qty 2

## 2015-05-21 MED ORDER — SUCCINYLCHOLINE CHLORIDE 20 MG/ML IJ SOLN
INTRAMUSCULAR | Status: AC
  Filled 2015-05-21: qty 1

## 2015-05-21 MED ORDER — BUPIVACAINE HCL 0.5 % IJ SOLN
INTRAMUSCULAR | Status: DC | PRN
  Administered 2015-05-21: 10 mL

## 2015-05-21 MED ORDER — OXYCODONE HCL 5 MG PO TABS
5.0000 mg | ORAL_TABLET | ORAL | Status: DC | PRN
  Administered 2015-05-21: 5 mg via ORAL
  Administered 2015-05-22 (×2): 10 mg via ORAL
  Administered 2015-05-23 (×2): 5 mg via ORAL
  Filled 2015-05-21 (×4): qty 2
  Filled 2015-05-21: qty 1

## 2015-05-21 MED ORDER — PROPOFOL 10 MG/ML IV BOLUS
INTRAVENOUS | Status: DC | PRN
  Administered 2015-05-21: 120 mg via INTRAVENOUS

## 2015-05-21 MED ORDER — GLYCOPYRROLATE 0.2 MG/ML IJ SOLN
INTRAMUSCULAR | Status: AC
  Filled 2015-05-21: qty 3

## 2015-05-21 MED ORDER — CARVEDILOL 12.5 MG PO TABS
12.5000 mg | ORAL_TABLET | Freq: Two times a day (BID) | ORAL | Status: DC
  Administered 2015-05-22 – 2015-05-23 (×4): 12.5 mg via ORAL
  Filled 2015-05-21 (×6): qty 1

## 2015-05-21 MED ORDER — CHLORHEXIDINE GLUCONATE CLOTH 2 % EX PADS
6.0000 | MEDICATED_PAD | Freq: Every day | CUTANEOUS | Status: DC
  Administered 2015-05-21 – 2015-05-23 (×3): 6 via TOPICAL

## 2015-05-21 MED ORDER — MIDAZOLAM HCL 2 MG/2ML IJ SOLN
INTRAMUSCULAR | Status: AC
  Filled 2015-05-21: qty 2

## 2015-05-21 MED ORDER — DIPHENHYDRAMINE HCL 50 MG/ML IJ SOLN
12.5000 mg | Freq: Four times a day (QID) | INTRAMUSCULAR | Status: DC | PRN
  Administered 2015-05-21: 12.5 mg via INTRAVENOUS
  Filled 2015-05-21: qty 1

## 2015-05-21 MED ORDER — FENTANYL CITRATE (PF) 250 MCG/5ML IJ SOLN
INTRAMUSCULAR | Status: AC
  Filled 2015-05-21: qty 5

## 2015-05-21 MED ORDER — LACTATED RINGERS IV SOLN
INTRAVENOUS | Status: DC | PRN
Start: 2015-05-21 — End: 2015-05-21
  Administered 2015-05-21: 07:00:00 via INTRAVENOUS

## 2015-05-21 MED ORDER — HYDROCHLOROTHIAZIDE 12.5 MG PO CAPS
12.5000 mg | ORAL_CAPSULE | Freq: Every day | ORAL | Status: DC
  Administered 2015-05-22 – 2015-05-25 (×4): 12.5 mg via ORAL
  Filled 2015-05-21 (×6): qty 1

## 2015-05-21 MED ORDER — HEMOSTATIC AGENTS (NO CHARGE) OPTIME
TOPICAL | Status: DC | PRN
  Administered 2015-05-21: 1 via TOPICAL

## 2015-05-21 MED ORDER — INSULIN ASPART 100 UNIT/ML ~~LOC~~ SOLN
0.0000 [IU] | SUBCUTANEOUS | Status: DC
  Administered 2015-05-21: 8 [IU] via SUBCUTANEOUS
  Administered 2015-05-22 (×2): 2 [IU] via SUBCUTANEOUS

## 2015-05-21 MED ORDER — ARTIFICIAL TEARS OP OINT
TOPICAL_OINTMENT | OPHTHALMIC | Status: AC
  Filled 2015-05-21: qty 3.5

## 2015-05-21 MED ORDER — PHENYLEPHRINE HCL 10 MG/ML IJ SOLN
10.0000 mg | INTRAVENOUS | Status: DC | PRN
  Administered 2015-05-21: 20 ug/min via INTRAVENOUS

## 2015-05-21 MED ORDER — LUNG SURGERY BOOK
Freq: Once | Status: AC
  Administered 2015-05-21: 18:00:00
  Filled 2015-05-21: qty 1

## 2015-05-21 MED ORDER — FENTANYL CITRATE (PF) 100 MCG/2ML IJ SOLN
25.0000 ug | INTRAMUSCULAR | Status: DC | PRN
  Administered 2015-05-21 (×4): 25 ug via INTRAVENOUS

## 2015-05-21 SURGICAL SUPPLY — 75 items
ADH SKN CLS APL DERMABOND .7 (GAUZE/BANDAGES/DRESSINGS)
BIT DRILL 7/64X5 DISP (BIT) ×3 IMPLANT
BLADE SURG 11 STRL SS (BLADE) ×2 IMPLANT
CANISTER SUCTION 2500CC (MISCELLANEOUS) ×6 IMPLANT
CATH KIT ON Q 5IN SLV (PAIN MANAGEMENT) IMPLANT
CATH KIT ON-Q SILVERSOAK 5 (CATHETERS) IMPLANT
CATH KIT ON-Q SILVERSOAK 5IN (CATHETERS) ×3 IMPLANT
CATH THORACIC 28FR (CATHETERS) ×4 IMPLANT
CATH THORACIC 36FR (CATHETERS) IMPLANT
CATH THORACIC 36FR RT ANG (CATHETERS) IMPLANT
CLIP TI MEDIUM 24 (CLIP) ×5 IMPLANT
CONT SPEC 4OZ CLIKSEAL STRL BL (MISCELLANEOUS) ×12 IMPLANT
COVER SURGICAL LIGHT HANDLE (MISCELLANEOUS) ×6 IMPLANT
DERMABOND ADVANCED (GAUZE/BANDAGES/DRESSINGS)
DERMABOND ADVANCED .7 DNX12 (GAUZE/BANDAGES/DRESSINGS) IMPLANT
DRAPE LAPAROSCOPIC ABDOMINAL (DRAPES) ×3 IMPLANT
DRAPE PROXIMA HALF (DRAPES) ×2 IMPLANT
DRAPE WARM FLUID 44X44 (DRAPE) ×3 IMPLANT
ELECT REM PT RETURN 9FT ADLT (ELECTROSURGICAL) ×3
ELECTRODE REM PT RTRN 9FT ADLT (ELECTROSURGICAL) ×1 IMPLANT
GAUZE SPONGE 4X4 12PLY STRL (GAUZE/BANDAGES/DRESSINGS) ×3 IMPLANT
GLOVE EUDERMIC 7 POWDERFREE (GLOVE) ×6 IMPLANT
GOWN STRL REUS W/ TWL LRG LVL3 (GOWN DISPOSABLE) ×2 IMPLANT
GOWN STRL REUS W/ TWL XL LVL3 (GOWN DISPOSABLE) ×1 IMPLANT
GOWN STRL REUS W/TWL LRG LVL3 (GOWN DISPOSABLE) ×9
GOWN STRL REUS W/TWL XL LVL3 (GOWN DISPOSABLE) ×3
HANDLE STAPLE ENDO GIA SHORT (STAPLE) ×2
KIT BASIN OR (CUSTOM PROCEDURE TRAY) ×3 IMPLANT
KIT ROOM TURNOVER OR (KITS) ×3 IMPLANT
NS IRRIG 1000ML POUR BTL (IV SOLUTION) ×12 IMPLANT
PACK CHEST (CUSTOM PROCEDURE TRAY) ×3 IMPLANT
PAD ARMBOARD 7.5X6 YLW CONV (MISCELLANEOUS) ×6 IMPLANT
RELOAD EGIA 60 MED/THCK PURPLE (STAPLE) ×6 IMPLANT
RELOAD EGIA TRIS TAN 45 CVD (STAPLE) ×21 IMPLANT
RELOAD STAPLE 45 TAN MED CVD (STAPLE) IMPLANT
RELOAD STAPLE 60 BLK XTHK ART (STAPLE) IMPLANT
RELOAD STAPLE 60 MED/THCK ART (STAPLE) IMPLANT
RELOAD TRI 2.0 60 XTHK VAS SUL (STAPLE) ×6 IMPLANT
SEALANT SURG COSEAL 4ML (VASCULAR PRODUCTS) IMPLANT
SEALANT SURG COSEAL 8ML (VASCULAR PRODUCTS) ×2 IMPLANT
SOLUTION ANTI FOG 6CC (MISCELLANEOUS) ×3 IMPLANT
SPECIMEN JAR MEDIUM (MISCELLANEOUS) ×3 IMPLANT
SPONGE GAUZE 4X4 12PLY STER LF (GAUZE/BANDAGES/DRESSINGS) ×2 IMPLANT
STAPLER ENDO GIA 12 SHRT THIN (STAPLE) IMPLANT
STAPLER ENDO GIA 12MM SHORT (STAPLE) ×1 IMPLANT
STAPLER TA30 4.8 NON-ABS (STAPLE) ×2 IMPLANT
SUT PROLENE 3 0 SH DA (SUTURE) IMPLANT
SUT PROLENE 4 0 RB 1 (SUTURE)
SUT PROLENE 4-0 RB1 .5 CRCL 36 (SUTURE) IMPLANT
SUT SILK  1 MH (SUTURE) ×4
SUT SILK 1 MH (SUTURE) ×2 IMPLANT
SUT SILK 1 TIES 10X30 (SUTURE) IMPLANT
SUT SILK 2 0SH CR/8 30 (SUTURE) IMPLANT
SUT SILK 3 0 SH CR/8 (SUTURE) ×2 IMPLANT
SUT VIC AB 1 CTX 36 (SUTURE) ×6
SUT VIC AB 1 CTX36XBRD ANBCTR (SUTURE) ×2 IMPLANT
SUT VIC AB 2 TP1 27 (SUTURE) ×5 IMPLANT
SUT VIC AB 2-0 CT1 27 (SUTURE)
SUT VIC AB 2-0 CT1 TAPERPNT 27 (SUTURE) IMPLANT
SUT VIC AB 2-0 CTX 36 (SUTURE) ×6 IMPLANT
SUT VIC AB 2-0 UR6 27 (SUTURE) IMPLANT
SUT VIC AB 3-0 MH 27 (SUTURE) IMPLANT
SUT VIC AB 3-0 SH 27 (SUTURE)
SUT VIC AB 3-0 SH 27X BRD (SUTURE) IMPLANT
SUT VIC AB 3-0 X1 27 (SUTURE) ×6 IMPLANT
SYSTEM SAHARA CHEST DRAIN ATS (WOUND CARE) ×3 IMPLANT
TAPE CLOTH SURG 4X10 WHT LF (GAUZE/BANDAGES/DRESSINGS) ×2 IMPLANT
TAPE UMBILICAL 1/8 X36 TWILL (MISCELLANEOUS) IMPLANT
TIP APPLICATOR SPRAY EXTEND 16 (VASCULAR PRODUCTS) IMPLANT
TOWEL OR 17X24 6PK STRL BLUE (TOWEL DISPOSABLE) ×3 IMPLANT
TOWEL OR 17X26 10 PK STRL BLUE (TOWEL DISPOSABLE) ×3 IMPLANT
TRAP SPECIMEN MUCOUS 40CC (MISCELLANEOUS) IMPLANT
TRAY FOLEY CATH 16FRSI W/METER (SET/KITS/TRAYS/PACK) ×3 IMPLANT
TUNNELER SHEATH ON-Q 11GX8 DSP (PAIN MANAGEMENT) ×2 IMPLANT
WATER STERILE IRR 1000ML POUR (IV SOLUTION) ×6 IMPLANT

## 2015-05-21 NOTE — Progress Notes (Signed)
[facility] BRIEF SICU PROGRESS NOTE  Day of Surgery  S/P Procedure(s) (LRB): LEFT THORACOTOMY/LEFT UPPER LOBECTOMY (Left)   Stable postop Adequate analgesia Breathing comfortably O2 sats 98% Chest tube output low, no air leak  Plan: Continue routine early postop  Rexene Alberts, MD 05/21/2015 7:23 PM

## 2015-05-21 NOTE — Op Note (Signed)
CARDIOTHORACIC SURGERY OPERATIVE NOTE 05/21/2015 George Montoya [id number]  Surgeon:  Gaye Pollack, MD  First Assistant: Jadene Pierini, PA-C    Preoperative Diagnosis:  Left upper lobe lung nodule  Postoperative Diagnosis:  same  Procedure:  1.  Left muscle-sparing thoracotomy 2.  Left upper lobectomy   Anesthesia:  General Endotracheal   Clinical History/Surgical Indication:  The patient is a 74 year old gentleman with a 50 pack-year smoking history until he quit in 1983, melanoma-in-situ resection from the right shoulder 3 years ago who woke up with severe left lower back pain on 08/22/2014 while in the [place] of [place]. He thought he had a kidney stone. He had a CT scan of the abdomen and chest which showed a 1.7 cm nodule in the left upper lobe of the lung and an incidental 4.1 cm fusiform ascending aortic aneurysm. There was no kidney stone and his pain resolved with pain medication and was felt to be musculoskeletal. He had a PET scan on 09/14/2014 that showed a 1.7 cm lobulated LUL nodule that was weakly hypermetabolic with an SUV max of 1.9. There were no hypermetabolic or enlarged mediastinal or hilar lymph nodes and no other abnormality except the ascending aortic aneurysm. He was seen by Dr. Chase Caller and underwent bronchoscopy and ENB with biopsies by Dr. Royston Cowper on 10/18/2014. All of the biopsies and brushings were negative for malignancy. He had a follow up CT scan on 01/24/2015 that showed no changed in the size of the nodule measuring 2.2 cm with an internal solid component measuring 1.7 cm. A repeat scan on 05/01/2015 shows that the lesion has increased in size to 3.1 x 2.3 cm and remains suspicious for a slow-growing neoplasm. The patient's mother died of lung cancer and his father died of prostate cancer.  He was evaluated by Dr. 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 His echo was normal as was his ECG and he has had no recurrence. He denies any chest pain or significant shortness of breath with exertion and is physically active. Given the CT findings I thought it would be best to have him evaluated again by Dr. Sallyanne Kuster before proceeding with a lobectomy. He was evaluated by Dr. Sallyanne Kuster and had a nuclear stress test that was a low risk study.   The left upper lobe lesion is slowly growing and is suspicious for a slowly growing adenocarcinoma given its mixed subsolid and solid appearance and low level hypermetabolic activity on prior PET. Bronchoscopic and ENB biopsies were negative but it has continued to increase in size which is highly suspicious for malignancy. I think the best option is surgical resection which will require a left upper lobectomy. His PFT's are normal. I discussed the operative procedure with the patient including alternatives, benefits and risks; including but not limited to bleeding, blood transfusion, infection, persistent air leak, bronchial stump complications, and recurrent cancer. George Montoya. understands and agrees to proceed.      Preparation:  The patient was seen in the preoperative holding area and the correct patient, correct operation were confirmed with the patient after reviewing the medical record and xrays. The left side of the chest was signed by me. The consent was signed by me. Preoperative antibiotics were given. A radial arterial line was placed by the anesthesia team. The patient was taken back to the operating room and positioned supine  on the operating room table. After being placed under general endotracheal anesthesia by the anesthesia team using a double lumen tube a foley catheter was placed. Lower extremity SCD's were placed. A time out was taken and the correct patient, operation and operative side were confirmed with nursing and anesthesia staff. Flexible bronchoscopy was not performed since he  recently had a normal bronchoscopy performed by pulmonary medicine. The patient was turned into the right lateral decubitus position with the left side up. The chest was prepped with betadine soap and solution and draped in the usual sterile manner. A surgical time-out was taken and the correct patient and operative procedure and operative side were confirmed with the nursing and anesthesia staff.   left muscle-sparing thoracotomy and left upper lobectomy:  The chest was entered through a lateral muscle-sparing thoracotomy incision. The pleural space was entered through the 4th ICS. The pleural space was unremarkable. The nodule was  palpable in the left upper lobe. The fissure was mostly incomplete. I did not feel that I could safely do a wedge resection of the nodule without getting close to the hilum or getting very close to the nodule.  The pulmonary artery branches to the left upper lobe were identified, encircled with a vessel loop and divided with a vascular stapler. The fissure was incomplete but superiorly it was opened and the lingular branches were divided in a similar manner. The left superior pulmonary vein was divided using a vascular stapler.The left upper lobe bronchus was encircled with a TA-30 stapler with 4.8 mm staples. The left lung was inflated and the lower lobe inflated easily. The stapler was fired and the bronchus divided with a knife. The fissure was completed using 60 mm medium purple staplers.  The specimen was sent to pathology. I did not request a frozen section since the lesion was palpable in the middle of the upper lobe and it was not going to change the operation at this point. Some of the 11 L lymph nodes were sent as separate specimens. Hemostasis was complete. The chest was filled with warm saline and the bronchial stump tested at 30 cm pressure. There was no air leak from the bronchus but a few bubbles from the raw surface at the fissure. This area was coated with Coseal.  The inferior pulmonary ligament was divided. The posterior mediastinal attachments to the left lower lobe were left intact to prevent torsion of the lobe.   Completion:  The an On-Q catheter was placed percutaneously and positioned in a sub-pleural location posteriorly and up to a level that was one interspace above the thoracotomy incision. Two 28 F chest tubes were placed in the pleural space posteriorly and anteriorly. The ribs were reapproximated with # 2 vicryl pericostal sutures with the sutures placed through holes drilled in the 5th rib and placed around the 4th rib. The muscles were returned to their normal anatomic position. The subcutaneous tissue was closed with 2-0 vicryl suture and the skin with 3-0 vicryl subcuticular suture. The sponge, needle, and instrument counts were correct according to the nurses. Dry sterile dressings were applied and the chest tubes were connected to pleurevac suction. The patient was turned into the supine position, extubated, and transferred to the PACU in satisfactory and stable condition.

## 2015-05-21 NOTE — Interval H&P Note (Signed)
History and Physical Interval Note:  05/21/2015 5:59 AM  George Montoya.  has presented today for surgery, with the diagnosis of LUNG NODULE  The various methods of treatment have been discussed with the patient and family. After consideration of risks, benefits and other options for treatment, the patient has consented to  Procedure(s): LEFT THORACOTOMY/LEFT UPPER LOBECTOMY (Left) as a surgical intervention .  The patient's history has been reviewed, patient examined, no change in status, stable for surgery.  I have reviewed the patient's chart and labs.  Questions were answered to the patient's satisfaction.     Gaye Pollack

## 2015-05-21 NOTE — Transfer of Care (Signed)
Immediate Anesthesia Transfer of Care Note  Patient: George Montoya.  Procedure(s) Performed: Procedure(s): LEFT THORACOTOMY/LEFT UPPER LOBECTOMY (Left)  Patient Location: PACU  Anesthesia Type:General  Level of Consciousness: awake, alert  and patient cooperative  Airway & Oxygen Therapy: Patient Spontanous Breathing and Patient connected to nasal cannula oxygen  Post-op Assessment: Report given to RN and Post -op Vital signs reviewed and stable  Post vital signs: Reviewed and stable  Last Vitals:  Filed Vitals:   05/21/15 0546  BP: 158/88  Pulse: 64  Temp: 36.7 C  Resp: 20    Complications: No apparent anesthesia complications

## 2015-05-21 NOTE — Anesthesia Preprocedure Evaluation (Addendum)
Anesthesia Evaluation  Patient identified by MRN, date of birth, ID band Patient awake    Reviewed: Allergy & Precautions, NPO status , Patient's Chart, lab work & pertinent test results  Airway Mallampati: II  TM Distance: >3 FB Neck ROM: Full    Dental  (+) Dental Advisory Given, Teeth Intact   Pulmonary shortness of breath, former smoker,    breath sounds clear to auscultation       Cardiovascular hypertension,  Rhythm:Regular     Neuro/Psych    GI/Hepatic GERD  ,  Endo/Other    Renal/GU      Musculoskeletal   Abdominal   Peds  Hematology   Anesthesia Other Findings   Reproductive/Obstetrics                           Anesthesia Physical Anesthesia Plan  ASA: III  Anesthesia Plan: General   Post-op Pain Management:    Induction: Intravenous  Airway Management Planned: Double Lumen EBT  Additional Equipment: Arterial line and CVP  Intra-op Plan:   Post-operative Plan: Extubation in OR  Informed Consent: I have reviewed the patients History and Physical, chart, labs and discussed the procedure including the risks, benefits and alternatives for the proposed anesthesia with the patient or authorized representative who has indicated his/her understanding and acceptance.   Dental advisory given  Plan Discussed with: CRNA and Anesthesiologist  Anesthesia Plan Comments:        Anesthesia Quick Evaluation

## 2015-05-21 NOTE — Anesthesia Procedure Notes (Signed)
Procedure Name: Intubation Date/Time: 05/21/2015 7:42 AM Performed by: Salli Quarry Jenipher Havel Pre-anesthesia Checklist: Patient identified, Emergency Drugs available, Suction available and Patient being monitored Patient Re-evaluated:Patient Re-evaluated prior to inductionOxygen Delivery Method: Circle system utilized Preoxygenation: Pre-oxygenation with 100% oxygen Intubation Type: IV induction Ventilation: Mask ventilation without difficulty Laryngoscope Size: Mac and 3 Grade View: Grade I Endobronchial tube: Left, Double lumen EBT, EBT position confirmed by auscultation and EBT position confirmed by fiberoptic bronchoscope and 39 Fr Number of attempts: 1 Airway Equipment and Method: Stylet and Fiberoptic brochoscope Placement Confirmation: ETT inserted through vocal cords under direct vision,  positive ETCO2 and breath sounds checked- equal and bilateral Secured at: 31 cm Tube secured with: Tape Dental Injury: Teeth and Oropharynx as per pre-operative assessment

## 2015-05-21 NOTE — Brief Op Note (Signed)
05/21/2015  10:58 AM  PATIENT:  [name].  74 y.o. male  PRE-OPERATIVE DIAGNOSIS:  LUNG NODULE  POST-OPERATIVE DIAGNOSIS:  LUNG NODULE  PROCEDURE:  Procedure(s): LEFT THORACOTOMY/LEFT UPPER LOBECTOMY (Left)  SURGEON:  Surgeon(s) and Role:    * Gaye Pollack, MD - Primary  PHYSICIAN ASSISTANT: Jadene Pierini, PA-C     ANESTHESIA:   general  EBL:  Total I/O In: 400 [I.V.:400] Out: 950 [Urine:850; Blood:100]  BLOOD ADMINISTERED:none  DRAINS: 2 Chest Tube(s) in the left pleural space   LOCAL MEDICATIONS USED:  MARCAINE     SPECIMEN:  Source of Specimen:  left upper lobe, level 11 lymph nodes  DISPOSITION OF SPECIMEN:  PATHOLOGY  COUNTS:  YES  TOURNIQUET:  * No tourniquets in log *  DICTATION: .Note written in EPIC  PLAN OF CARE: Admit to inpatient   PATIENT DISPOSITION:  PACU - hemodynamically stable.   Delay start of Pharmacological VTE agent (>24hrs) due to surgical blood loss or risk of bleeding: yes

## 2015-05-21 NOTE — H&P (Signed)
[address]       [place] [address]             [phone]      Cardiothoracic Surgery History and Physical    PCP is Donnajean Lopes, MD Referring Provider is Leanna Battles, MD  Chief Complaint  Patient presents with  . Left upper lobe hypermetabolic lung nodule        HPI:  The patient is a 74 year old gentleman with a 63 pack-year smoking history until he quit in 1983, melanoma-in-situ resection from the right shoulder 3 years ago who woke up with severe left lower back pain on 08/22/2014 while in the [place] of [place]. He thought he had a kidney stone. He had a CT scan of the abdomen and chest which showed a 1.7 cm nodule in the left upper lobe of the lung and an incidental 4.1 cm fusiform ascending aortic aneurysm. There was no kidney stone and his pain resolved with pain medication and was felt to be musculoskeletal. He had a PET scan on 09/14/2014 that showed a 1.7 cm lobulated LUL nodule that was weakly hypermetabolic with an SUV max of 1.9. There were no hypermetabolic or enlarged mediastinal or hilar lymph nodes and no other abnormality except the ascending aortic aneurysm. He was seen by Dr. Chase Caller and underwent bronchoscopy and ENB with biopsies by Dr. Royston Cowper on 10/18/2014. All of the biopsies and brushings were negative for malignancy. He had a follow up CT scan on 01/24/2015 that showed no changed in the size of the nodule measuring 2.2 cm with an internal solid component measuring 1.7 cm. A repeat scan on 05/01/2015 shows that the lesion has increased in size to 3.1 x 2.3 cm and remains suspicious for a slow-growing neoplasm. The patient's mother died of lung cancer and his father died of prostate cancer.  He was evaluated by Dr. 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 His echo was normal as was his ECG and he has had no  recurrence. He denies any chest pain or significant shortness of breath with exertion and is physically active. Given the CT findings I thought it would be best to have him evaluated again by Dr. Sallyanne Kuster before proceeding with a lobectomy. He was evaluated by Dr. Sallyanne Kuster and had a nuclear stress test that was a low risk study.   Past Medical History  Diagnosis Date  . High cholesterol   . Hypertension   . Bowel obstruction   . Melanoma     Past Surgical History  Procedure Laterality Date  . Melanoma excision    . Hernia repair    . Appendectomy      History reviewed. No pertinent family history.  Social History History  Substance Use Topics  . Smoking status: Former Smoker -- 2.00 packs/day for 25 years    Types: Cigarettes    Quit date: 10/21/1981  . Smokeless tobacco: Never Used  . Alcohol Use: 12.0 oz/week    20 Standard drinks or equivalent per week     Comment: occ    Current Outpatient Prescriptions  Medication Sig Dispense Refill  . Calcium Carb-Cholecalciferol (CALCIUM 600 + D PO) Take 600 mg by mouth daily.    . carvedilol (COREG) 12.5 MG tablet Take 1 tablet (12.5 mg total) by mouth 2 (two) times daily. 180 tablet 3  .  ibuprofen (ADVIL,MOTRIN) 200 MG tablet Take 200 mg by mouth every 6 (six) hours as needed for mild pain or moderate pain.    . Multiple Vitamin (MULTIVITAMIN WITH MINERALS) TABS tablet Take 2 tablets by mouth daily.    . rosuvastatin (CRESTOR) 10 MG tablet Take 5 mg by mouth every Monday, Wednesday, and Friday. In AM    . telmisartan-hydrochlorothiazide (MICARDIS HCT) 40-12.5 MG tablet TAKE 1/2 TABLET DAILY. 15 tablet 10  . oxyCODONE-acetaminophen (PERCOCET/ROXICET) 5-325 MG per tablet Take 0.5 tablets by mouth 2 (two) times daily as needed (pain).    . predniSONE (DELTASONE) 20 MG tablet Take 60 mg by mouth See admin instructions. Take 3 tablets  (60 mg) daily for 20 days whenever experiencing an episode of hearing loss     No current facility-administered medications for this visit.                 Allergies  Allergen Reactions  . Dust Mite Extract   . Adhesive [Tape] Rash    Review of Systems  Constitutional: Negative.  HENT: Positive for hearing loss.  Eyes: Negative.  Respiratory: Negative.  Cardiovascular: Negative.  Gastrointestinal: Negative.  Endocrine: Negative.  Genitourinary: Negative.  Musculoskeletal: Negative.  Skin: Negative.  Allergic/Immunologic: Negative.  Neurological: Negative.  Hematological: Negative.  Psychiatric/Behavioral: Negative.    BP 189/97 mmHg  Pulse 63  Resp 20  Ht '5\' 8"'$  (1.727 m)  Wt 195 lb (88.451 kg)  BMI 29.66 kg/m2  SpO2 98% Physical Exam  Constitutional: He is oriented to person, place, and time. He appears well-developed and well-nourished. No distress.  HENT:  Head: Normocephalic and atraumatic.  Mouth/Throat: Oropharynx is clear and moist.  Eyes: EOM are normal. Pupils are equal, round, and reactive to light.  Neck: Normal range of motion. Neck supple. No JVD present. No thyromegaly present.  Cardiovascular: Normal rate, regular rhythm, normal heart sounds and intact distal pulses.  No murmur heard. Pulmonary/Chest: Effort normal and breath sounds normal. No respiratory distress. He has no wheezes. He exhibits no tenderness.  Abdominal: Soft. Bowel sounds are normal. He exhibits no distension and no mass. There is no tenderness.  Musculoskeletal: Normal range of motion. He exhibits no edema.  Lymphadenopathy:   He has no cervical adenopathy.  Neurological: He is alert and oriented to person, place, and time. He has normal strength. No cranial nerve deficit or sensory deficit.  Skin: Skin is warm and dry.  Psychiatric: He has a normal mood and affect.     Diagnostic Tests:  CLINICAL DATA: 74 year old male with history of  lung nodule. Three-month follow-up.  EXAM: CT CHEST WITHOUT CONTRAST  TECHNIQUE: Multidetector CT imaging of the chest was performed following the standard protocol without IV contrast.  COMPARISON: Chest CT 01/24/2015.  FINDINGS: Mediastinum/Lymph Nodes: Heart size is normal. There is no significant pericardial fluid, thickening or pericardial calcification. There is atherosclerosis of the thoracic aorta, the great vessels of the mediastinum and the coronary arteries, including calcified atherosclerotic plaque in the left main, left anterior descending, left circumflex and right coronary arteries. No pathologically enlarged mediastinal or hilar lymph nodes. Please note that accurate exclusion of hilar adenopathy is limited on noncontrast CT scans. Esophagus is unremarkable in appearance. No axillary lymphadenopathy.  Lungs/Pleura: The previously described mixed solid and sub solid nodule in the left upper lobe continues to increase in size, currently measuring up to 3.1 x 2.3 cm (image 18 of series 3), with a central solid component that measures 10 mm (image 17  of series 2), and remains highly concerning for a slow-growing neoplasm such as a primary pulmonary adenocarcinoma. No other new suspicious appearing pulmonary nodules or masses are noted. No acute consolidative airspace disease. No pleural effusions.  Upper Abdomen: Diffuse low attenuation throughout the hepatic parenchyma, compatible with severe hepatic steatosis. Exophytic 11 mm low-attenuation lesion in the lateral aspect of the upper pole of the right kidney is incompletely characterized, but similar to prior studies, presumably a small cyst. Atherosclerosis.  Musculoskeletal/Soft Tissues: There are no aggressive appearing lytic or blastic lesions noted in the visualized portions of the skeleton.  IMPRESSION: 1. Interval growth of a mixed subsolid and solid nodule in the left upper lobe, as detailed  above, which remains highly concerning for primary bronchogenic adenocarcinoma. Surgical resection is strongly suggested. This recommendation follows the consensus statement: Recommendations for the Management of Subsolid Pulmonary Nodules Detected at CT: A Statement from the Fleischner Society as published in Radiology 2013; 266:304-317. 2. Atherosclerosis, including left main and 3 vessel coronary artery disease. Assessment for potential risk factor modification, dietary therapy or pharmacologic therapy may be warranted, if clinically indicated.   Electronically Signed  By: Vinnie Langton M.D.  On: 05/01/2015 15:32    CLINICAL DATA: Initial treatment strategy for pulmonary nodule.  EXAM: NUCLEAR MEDICINE PET SKULL BASE TO THIGH  TECHNIQUE: 9.8 mCi F-18 FDG was injected intravenously. Full-ring PET imaging was performed from the skull base to thigh after the radiotracer. CT data was obtained and used for attenuation correction and anatomic localization.  FASTING BLOOD GLUCOSE: Value: 79 mg/dl  COMPARISON: None.  FINDINGS: NECK  No hypermetabolic lymph nodes in the neck.  CHEST  17 x 15 mm lobulated left upper lobe lung lesion is weakly hypermetabolic with SUV max of 1.9. This is not considered neoplastic range uptake. However, a slow growing low grade adenocarcinoma is possible. Recommend correlation with prior imaging studies and either close CT followup or biopsy. No hypermetabolic or enlarged mediastinal or hilar lymph nodes. No other pulmonary nodules. Additional findings include tortuosity, ectasia and calcification of the ascending aorta with aneurysmal dilatation with maximal measurements of 4.1 x 4.0 cm. Coronary artery calcifications.  ABDOMEN/PELVIS  No abnormal hypermetabolic activity within the liver, pancreas, adrenal glands, or spleen. No hypermetabolic lymph nodes in the abdomen or pelvis.  SKELETON  No focal  hypermetabolic activity to suggest skeletal metastasis.  IMPRESSION: Solitary left upper lobe pulmonary nodule measuring approximately 17 x 15 mm is weakly hypermetabolic with SUV max of 1.9. Recommend correlation with any prior imaging studies. Close CT follow-up versus image guided biopsy.  No mediastinal or hilar adenopathy and no findings for metastatic disease.  Three vessel coronary artery calcifications  Aneurysmal dilatation of the ascending thoracic aorta. Recommend semi-annual imaging followup by CTA or MRA and referral to cardiothoracic surgery if not already obtained. This recommendation follows 2010 ACCF/AHA/AATS/ACR/ASA/SCA/SCAI/SIR/STS/SVM Guidelines for the Diagnosis and Management of Patients With Thoracic Aortic Disease. Circulation. 2010; 121: E423-N36   Electronically Signed  By: Marijo Sanes M.D.  On: 09/14/2014 08:52    Ref Range 70moago    FVC-Pre L 4.21P   FVC-%Pred-Pre % 104P   FVC-Post L 4.34P   FVC-%Pred-Post % 107P   FVC-%Change-Post % 3P   FEV1-Pre L 3.10P   FEV1-%Pred-Pre % 105P   FEV1-Post L 3.07P   FEV1-%Pred-Post % 104P   FEV1-%Change-Post % 0P   FEV6-Pre L 4.18P   FEV6-%Pred-Pre % 110P   FEV6-Post L 4.30P   FEV6-%Pred-Post % 113P   FEV6-%Change-Post %  2P   Pre FEV1/FVC ratio % 74P   FEV1FVC-%Pred-Pre % 100P   Post FEV1/FVC ratio % 71P   FEV1FVC-%Change-Post % -3P   Pre FEV6/FVC Ratio % 99P   FEV6FVC-%Pred-Pre % 105P   Post FEV6/FVC ratio % 99P   FEV6FVC-%Pred-Post % 105P   FEV6FVC-%Change-Post % 0P   FEF 25-75 Pre L/sec 2.32P   FEF2575-%Pred-Pre % 107P   FEF 25-75 Post L/sec 2.24P   FEF2575-%Pred-Post % 104P   FEF2575-%Change-Post % -3P   RV L 2.75P   RV % pred % 112P   TLC L 6.96P   TLC % pred % 103P   DLCO unc ml/min/mmHg 25.93P   DLCO  unc % pred % 85P   DL/VA ml/min/mmHg/L 3.93P   DL/VA % pred % 87P   Resulting Agency [facility]       Specimen Collected: 10/20/14 3:44 PM Last Resulted: 10/20/14 5:05 PM             P=Value has a preliminary status      Scans on Order [id number]        Scan on 10/20/2014 5:06 PM by Brand Males, MD              Impression:  The left upper lobe lesion is slowly growing and is suspicious for a slowly growing adenocarcinoma given its mixed subsolid and solid appearance and low level hypermetabolic activity on prior PET. Bronchoscopic and ENB biopsies were negative but it has continued to increase in size which is highly suspicious for malignancy. I think the best option is surgical resection which will require a left upper lobectomy. His PFT's are normal. I discussed the operative procedure with the patient including alternatives, benefits and risks; including but not limited to bleeding, blood transfusion, infection, persistent air leak, bronchial stump complications, and recurrent cancer.  [name]. understands and agrees to proceed.    Plan:  Left thoracotomy and left upper lobectomy.     Gaye Pollack, MD [facility] [phone]

## 2015-05-21 NOTE — Anesthesia Postprocedure Evaluation (Signed)
Anesthesia Post Note  Patient: George Montoya.  Procedure(s) Performed: Procedure(s) (LRB): LEFT THORACOTOMY/LEFT UPPER LOBECTOMY (Left)  Patient location during evaluation: PACU Anesthesia Type: General Level of consciousness: awake, awake and alert and patient cooperative Pain management: pain level controlled Vital Signs Assessment: post-procedure vital signs reviewed and stable Respiratory status: spontaneous breathing and patient connected to nasal cannula oxygen Cardiovascular status: stable Postop Assessment: No headache Anesthetic complications: no    Last Vitals:  Filed Vitals:   05/21/15 1300 05/21/15 1312  BP:  144/92  Pulse: 55 79  Temp:  36.8 C  Resp: 10 20    Last Pain:  Filed Vitals:   05/21/15 1315  PainSc: 4                  Candido Flott COKER

## 2015-05-22 ENCOUNTER — Inpatient Hospital Stay (HOSPITAL_COMMUNITY): Payer: BLUE CROSS/BLUE SHIELD

## 2015-05-22 ENCOUNTER — Encounter (HOSPITAL_COMMUNITY): Payer: Self-pay | Admitting: Surgery

## 2015-05-22 LAB — TYPE AND SCREEN
ABO/RH(D): A POS
ANTIBODY SCREEN: NEGATIVE
UNIT DIVISION: 0
Unit division: 0

## 2015-05-22 LAB — BLOOD GAS, ARTERIAL
Acid-base deficit: 5.5 mmol/L — ABNORMAL HIGH (ref 0.0–2.0)
Bicarbonate: 17.8 mEq/L — ABNORMAL LOW (ref 20.0–24.0)
DRAWN BY: 441661
FIO2: 0.21
O2 Saturation: 96.8 %
PATIENT TEMPERATURE: 98.6
PH ART: 7.449 (ref 7.350–7.450)
TCO2: 18.6 mmol/L (ref 0–100)
pCO2 arterial: 26.2 mmHg — ABNORMAL LOW (ref 35.0–45.0)
pO2, Arterial: 83.4 mmHg (ref 80.0–100.0)

## 2015-05-22 LAB — CBC
HEMATOCRIT: 34.9 % — AB (ref 39.0–52.0)
HEMOGLOBIN: 12 g/dL — AB (ref 13.0–17.0)
MCH: 34.4 pg — ABNORMAL HIGH (ref 26.0–34.0)
MCHC: 34.4 g/dL (ref 30.0–36.0)
MCV: 100 fL (ref 78.0–100.0)
Platelets: 181 10*3/uL (ref 150–400)
RBC: 3.49 MIL/uL — ABNORMAL LOW (ref 4.22–5.81)
RDW: 11.8 % (ref 11.5–15.5)
WBC: 10 10*3/uL (ref 4.0–10.5)

## 2015-05-22 LAB — BASIC METABOLIC PANEL
Anion gap: 8 (ref 5–15)
BUN: 7 mg/dL (ref 6–20)
CO2: 24 mmol/L (ref 22–32)
CREATININE: 0.77 mg/dL (ref 0.61–1.24)
Calcium: 8.6 mg/dL — ABNORMAL LOW (ref 8.9–10.3)
Chloride: 99 mmol/L — ABNORMAL LOW (ref 101–111)
GFR calc Af Amer: >60 mL/min (ref >60)
Glucose, Bld: 104 mg/dL — ABNORMAL HIGH (ref 65–99)
POTASSIUM: 4.1 mmol/L (ref 3.5–5.1)
SODIUM: 131 mmol/L — AB (ref 135–145)

## 2015-05-22 LAB — GLUCOSE, CAPILLARY
GLUCOSE-CAPILLARY: 144 mg/dL — AB (ref 65–99)
Glucose-Capillary: 103 mg/dL — ABNORMAL HIGH (ref 65–99)
Glucose-Capillary: 131 mg/dL — ABNORMAL HIGH (ref 65–99)

## 2015-05-22 MED ORDER — HYDRALAZINE HCL 20 MG/ML IJ SOLN
10.0000 mg | INTRAMUSCULAR | Status: DC | PRN
  Administered 2015-05-22 – 2015-05-23 (×3): 10 mg via INTRAVENOUS
  Filled 2015-05-22 (×3): qty 1

## 2015-05-22 NOTE — Progress Notes (Signed)
Patient ambulated in hallway 900 ft, min assist with wheelchair, tolerated walk well, patient up to chair, family at bedside, call bell within reach, will continue to monitor.  Rowe Pavy, RN

## 2015-05-22 NOTE — Progress Notes (Signed)
CT surgery p.m. Rounds  Patient resting comfortably in  [place] also walks today Minimal chest tube drainage without airleak Sinus rhythm Stable day

## 2015-05-22 NOTE — Progress Notes (Signed)
Patient ambulated in hallway 600 ft, min assist with wheelchair, tolerated walk well, patient up to chair, family at bedside, call bell within reach, no complaints of pain, will continue to monitor.  Rowe Pavy, RN

## 2015-05-23 ENCOUNTER — Inpatient Hospital Stay (HOSPITAL_COMMUNITY): Payer: BLUE CROSS/BLUE SHIELD

## 2015-05-23 MED ORDER — GUAIFENESIN ER 600 MG PO TB12
600.0000 mg | ORAL_TABLET | Freq: Two times a day (BID) | ORAL | Status: DC
  Administered 2015-05-23 – 2015-05-25 (×5): 600 mg via ORAL
  Filled 2015-05-23 (×5): qty 1

## 2015-05-23 NOTE — Progress Notes (Addendum)
DC fentanyl PCA 23ms in med room with LSerita Kyle, RN.  BOWMAN, HMarsh Dolly RN   656mfentanyl wasted with HeJake BatheRN

## 2015-05-23 NOTE — Progress Notes (Signed)
Patient ambulated in hallway 900 ft, min assist, wheelchair, tolerated walk well, up to chair, call bell within reach, will continue to monitor.  Rowe Pavy, RN

## 2015-05-23 NOTE — Progress Notes (Addendum)
Patient transferred to 2W07, patient ambulated from 2S to new room with min assist and wheelchair, belongings at bedside including SCDs, wife at bedside, receiving RN at bedside, no questions from patient or family.  BOWMAN, HEATHER M

## 2015-05-23 NOTE — Progress Notes (Signed)
Patient was transferred and is stable.  He is resting in the recliner, central telemetry has been notified. Wife is with him.

## 2015-05-23 NOTE — Progress Notes (Signed)
2 Days Post-Op Procedure(s) (LRB): LEFT THORACOTOMY/LEFT UPPER LOBECTOMY (Left) Subjective:  No complaints. Pain well-controlled. Had BM. Ambulated 3 laps this am.  Objective: Vital signs in last 24 hours: Temp:  [97.8 F (36.6 C)-99.4 F (37.4 C)] 97.8 F (36.6 C) (11/30 0748) Pulse Rate:  [68-103] 93 (11/30 0800) Cardiac Rhythm:  [-] Normal sinus rhythm (11/30 0800) Resp:  [11-31] 12 (11/30 0800) BP: (126-183)/(73-106) 166/87 mmHg (11/30 0800) SpO2:  [93 %-99 %] 93 % (11/30 0800)  Hemodynamic parameters for last 24 hours:    Intake/Output from previous day: 11/29 0701 - 11/30 0700 In: 1454.2 [P.O.:120; I.V.:1289.2] Out: 3010 [Urine:2850; Chest Tube:160] Intake/Output this shift: Total I/O In: 100 [I.V.:100] Out: -   General appearance: alert and cooperative Heart: regular rate and rhythm, S1, S2 normal, no murmur, click, rub or gallop Lungs: clear to auscultation bilaterally no air leak from chest tube  Lab Results:  Recent Labs  05/21/15 0855 05/22/15 0450  WBC  --  10.0  HGB 11.9* 12.0*  HCT 35.0* 34.9*  PLT  --  181   BMET:  Recent Labs  05/21/15 0631 05/21/15 0855 05/22/15 0450  NA 128* 127* 131*  K 3.5 3.7 4.1  CL  --   --  99*  CO2  --   --  24  GLUCOSE 112*  --  104*  BUN  --   --  7  CREATININE  --   --  0.77  CALCIUM  --   --  8.6*    PT/INR: No results for input(s): LABPROT, INR in the last 72 hours. ABG    Component Value Date/Time   PHART 7.449 05/22/2015 0218   HCO3 17.8* 05/22/2015 0218   TCO2 18.6 05/22/2015 0218   ACIDBASEDEF 5.5* 05/22/2015 0218   O2SAT 96.8 05/22/2015 0218   CBG (last 3)   Recent Labs  05/22/15 0035 05/22/15 0418 05/22/15 0706  GLUCAP 144* 103* 131*   CLINICAL DATA: Left upper lobe wedge resection.  EXAM: PORTABLE CHEST 1 VIEW  COMPARISON: 05/22/2015.  FINDINGS: Interim removal of the medial-most left chest tube. Lateral most left chest tube in stable position. Interim removal of right  IJ line. No pneumothorax. Postsurgical changes left upper lung. Stable cardiomegaly. Improving left chest wall subcutaneous emphysema .  IMPRESSION: 1. Interim removal of medial most left chest tube. Lateral most left chest tube in stable position. Interim removal of right IJ line . No pneumothorax. Improving left chest wall subcutaneous emphysema. 2. Postsurgical changes left upper lung. 3. Stable cardiomegaly.   Electronically Signed  By: Marcello Moores Register  On: 05/23/2015 07:40  Assessment/Plan: S/P Procedure(s) (LRB): LEFT THORACOTOMY/LEFT UPPER LOBECTOMY (Left)  He continues to do well POD 2. Will remove the other chest tube.  Will check 2V CXR in am.  Continue IS and ambulation  Follow up on pathology.  Hypertension: his BP has been elevated on his home regimen. PRN hydralazine ordered. I suspect this will come down as he recovers.   LOS: 2 days    George Montoya 05/23/2015

## 2015-05-23 NOTE — Clinical Documentation Improvement (Signed)
Cardiothoracic  Abnormal Lab/Test Results:  Sodium: 11/29:  131. 11/28:  127.  Possible Clinical Conditions associated with below indicators  Hyponatremia  Other Condition  Cannot Clinically Determine   Treatment Provided: 11/28:  D5 0.9% NaCl w/20 meq kcl @ 50 ccml/hr per flowsheets.   Please exercise your independent, professional judgment when responding. A specific answer is not anticipated or expected.   Thank You,  [place] [phone]

## 2015-05-23 NOTE — Plan of Care (Signed)
Problem: Phase I Progression Outcomes Goal: Pain controlled with appropriate interventions Outcome: Progressing Pt now off PCA Goal: Activity tolerated as ordered Outcome: Progressing Pt walked x3 yesterday. Goal: If Diabetic, blood sugar < 150 Outcome: Completed/Met Date Met:  05/23/15 Pt not diabetic.

## 2015-05-24 ENCOUNTER — Inpatient Hospital Stay (HOSPITAL_COMMUNITY): Payer: BLUE CROSS/BLUE SHIELD

## 2015-05-24 ENCOUNTER — Encounter: Payer: Self-pay | Admitting: Internal Medicine

## 2015-05-24 MED ORDER — TRAMADOL HCL 50 MG PO TABS
50.0000 mg | ORAL_TABLET | Freq: Four times a day (QID) | ORAL | Status: DC | PRN
  Administered 2015-05-24 (×2): 50 mg via ORAL
  Filled 2015-05-24 (×2): qty 1

## 2015-05-24 MED ORDER — CARVEDILOL 25 MG PO TABS: 25.0000 mg | ORAL_TABLET | Freq: Two times a day (BID) | ORAL | Status: DC

## 2015-05-24 MED ORDER — CARVEDILOL 25 MG PO TABS
25.0000 mg | ORAL_TABLET | Freq: Two times a day (BID) | ORAL | Status: DC
  Administered 2015-05-24 – 2015-05-25 (×3): 25 mg via ORAL
  Filled 2015-05-24 (×3): qty 1

## 2015-05-24 NOTE — Progress Notes (Signed)
Patient ID: George Montoya., male   DOB: 04-18-1941, 74 y.o.   MRN: [id number]  Pathology shows a T1b well differentiated adenocarcinoma with negative lymph nodes. There is adenocarcinoma at the stapled parenchymal resection margin between the upper and lower lobes. Grossly the tumor appeared confined to the upper lobe but the fissue was almost totally incomplete except for a small indention at the superior segment. I resected the entire upper lobe and I don't think he is a candidate for pneumonectomy at 74 years old. I will have him evaluated for XRT in [facility] clinic next week. I discussed the results with the patient and his wife and answered their questions.

## 2015-05-24 NOTE — Progress Notes (Addendum)
3 Days Post-Op Procedure(s) (LRB): LEFT THORACOTOMY/LEFT UPPER LOBECTOMY (Left) Subjective: Feels well   Objective: Vital signs in last 24 hours: Temp:  [97.8 F (36.6 C)-99.9 F (37.7 C)] 99.9 F (37.7 C) (12/01 0540) Pulse Rate:  [84-108] 84 (12/01 0540) Cardiac Rhythm:  [-] Normal sinus rhythm (12/01 0714) Resp:  [12-24] 20 (12/01 0540) BP: (128-187)/(77-109) 176/87 mmHg (12/01 0540) SpO2:  [93 %-99 %] 96 % (12/01 0540)  Hemodynamic parameters for last 24 hours:    Intake/Output from previous day: 11/30 0701 - 12/01 0700 In: 580 [P.O.:480; I.V.:100] Out: 1100 [Urine:1100] Intake/Output this shift:    General appearance: alert, cooperative and no distress Heart: regular rate and rhythm Lungs: + sub q air, some cracles on Left Abdomen: mild dist, + BS, non-tender Extremities: no edema Wound: dressing CDI  Lab Results:  Recent Labs  05/21/15 0855 05/22/15 0450  WBC  --  10.0  HGB 11.9* 12.0*  HCT 35.0* 34.9*  PLT  --  181   BMET:  Recent Labs  05/21/15 0855 05/22/15 0450  NA 127* 131*  K 3.7 4.1  CL  --  99*  CO2  --  24  GLUCOSE  --  104*  BUN  --  7  CREATININE  --  0.77  CALCIUM  --  8.6*    PT/INR: No results for input(s): LABPROT, INR in the last 72 hours. ABG    Component Value Date/Time   PHART 7.449 05/22/2015 0218   HCO3 17.8* 05/22/2015 0218   TCO2 18.6 05/22/2015 0218   ACIDBASEDEF 5.5* 05/22/2015 0218   O2SAT 96.8 05/22/2015 0218   CBG (last 3)   Recent Labs  05/22/15 0035 05/22/15 0418 05/22/15 0706  GLUCAP 144* 103* 131*    Meds Scheduled Meds: . carvedilol  12.5 mg Oral BID  . guaiFENesin  600 mg Oral BID  . irbesartan  150 mg Oral Daily   And  . hydrochlorothiazide  12.5 mg Oral Daily  . rosuvastatin  5 mg Oral Q M,W,F  . senna-docusate  1 tablet Oral QHS   Continuous Infusions: . bupivacaine ON-Q pain pump     PRN Meds:.hydrALAZINE, ondansetron (ZOFRAN) IV, oxyCODONE, pneumococcal 23 valent  vaccine  Xrays Dg Chest Port 1 View  05/23/2015  CLINICAL DATA:  Left upper lobe wedge resection. EXAM: PORTABLE CHEST 1 VIEW COMPARISON:  05/22/2015. FINDINGS: Interim removal of the medial-most left chest tube. Lateral most left chest tube in stable position. Interim removal of right IJ line. No pneumothorax. Postsurgical changes left upper lung. Stable cardiomegaly. Improving left chest wall subcutaneous emphysema . IMPRESSION: 1. Interim removal of medial most left chest tube. Lateral most left chest tube in stable position. Interim removal of right IJ line . No pneumothorax. Improving left chest wall subcutaneous emphysema. 2. Postsurgical changes left upper lung. 3. Stable cardiomegaly. Electronically Signed   By: Marcello Moores  Register   On: 05/23/2015 07:40    Assessment/Plan: S/P Procedure(s) (LRB): LEFT THORACOTOMY/LEFT UPPER LOBECTOMY (Left)  1 small pneumo on left- repaeat cxr in am 2 cont rehab/pulm toilet- routine 3 path pending 4 coreg- increase for HTN     LOS: 3 days    George Montoya,George Montoya 05/24/2015   Chart reviewed, patient examined, agree with above. He feels well and slept well Does not want to take Oxy IR due to hallucinations he had. Will switch to Ultram. CXR this am shows small left apical ptx. Will keep him here today and repeat in the am. DC On-Q

## 2015-05-24 NOTE — Discharge Summary (Signed)
Physician Discharge Summary  Patient ID: Randolf Sansoucie. MRN: [id number] DOB/AGE: [id number] 74 y.o.  Admit date: 05/21/2015 Discharge date: 05/25/2015  Admission Diagnoses:  Patient Active Problem List   Diagnosis Date Noted  . Coronary artery calcification seen on CAT scan 05/14/2015  . Preoperative cardiovascular examination 05/14/2015  . Hyperlipidemia 05/14/2015  . Nodule of left lung 10/02/2014  . Stopped smoking with greater than 40 pack year history 10/02/2014  . Syncope 03/21/2014  . HTN (hypertension) 03/21/2014   Discharge Diagnoses:   Patient Active Problem List   Diagnosis Date Noted  . S/P lobectomy of lung 05/21/2015  . Coronary artery calcification seen on CAT scan 05/14/2015  . Preoperative cardiovascular examination 05/14/2015  . Hyperlipidemia 05/14/2015  . Nodule of left lung 10/02/2014  . Stopped smoking with greater than 40 pack year history 10/02/2014  . Syncope 03/21/2014  . HTN (hypertension) 03/21/2014   Discharged Condition: good  History of Present Illness:  The patient is a 74 yo old male with a 50 pack year smoking history, however he quit in 1983 and H/O Melanoma in situ with resection of the right shoulder approximately 3 years ago.  The patient states in March of this year the patient woke up with severe lower back pain while in the [place].  The patient thought he had a kidney stone and presented for treatment.  CT scan of the chest and abdomen was done and showed a 1.7 cm nodule in the left upper lobe of the lung and an incidental 4.1 cm fusiform ascending aortic aneurysm.  There was no kidney stone identified.  His pain later resolved and was felt to be musculoskeletal.  He had a PET CT scan on 09/14/2014 which showed the left upper lobe lung nodule to be mildly hypermetabolic without adenopathy in the mediastinum or hylum.  He was evaluated by Dr. Chase Caller and underwent bronchoscopy with ENB with biopsies.  These were  negative for malignancy.  Since then he was followed with serial CT scans.  His most recent was done on 05/01/2015 which showed an increase in size of the pulmonary nodule to 3.1 x 2.3 cm.  He was subsequently referred to Dr. Cyndia Bent for surgical resection.  He was evaluated on by Dr. Cyndia Bent on 05/09/2015 at which time he was in agreement the patient should undergo surgical resection.  However, prior to proceeding with surgery it was felt patient should obtain Cardiac clearance.  He was evaluated by Dr. Sallyanne Kuster and stress testing showed the patient to be low risk.  Hospital Course:   Mr. Nuttall presented to [facility] on 05/21/2015.  He was taken to the operating room and underwent Left Muscle Sparing Thoracotomy with Left upper lobectomy.  He tolerated the procedure without difficulty, was extubated and taken to the SICU in stable condition.  The patient did well post operatively.  His chest tubes remained stable and were free from air leak.  They were weaned off suction as tolerated.  Chest tubes were removed on POD #1 and #2 without difficulty.  He was felt medically stable for transfer to the stepdown unit in stable condition.  The patient underwent repeat CXR on POD #3.  This revealed a small pneumothorax and it was felt patient should be monitored for 24 hours.  The patient is otherwise doing very well.  His pain is well controlled.  He is ambulating without difficulty.  He is tolerating a regular diet.  Repeat CXR obtained shows the pneumothorax  to be stable and improvement of subcutaneous emphysema.  He is felt to be medically stable for discharge 05/25/2015.    Significant Diagnostic Studies: angiography:   Solitary left upper lobe pulmonary nodule measuring approximately 17 x 15 mm is weakly hypermetabolic with SUV max of 1.9. Recommend correlation with any prior imaging studies. Close CT follow-up versus image guided biopsy.  No mediastinal or hilar adenopathy and no findings for  metastatic disease.  Three vessel coronary artery calcifications  PATHOLOGY: 1. Lymph node, biopsy, Level 11 - THERE IS NO EVIDENCE OF CARCINOMA IN 1 OF 1 LYMPH NODE (0/1). 2. Lung, resection (segmental or lobe), Left upper lobe - ADENOCARCINOMA, WELL DIFFERENTIATED, SPANNING 2.6 CM. - ADENOCARCINOMA IS PRESENT AT THE STAPLED PARENCHYMAL RESECTION MARGIN. - SEE ONCOLOGY TABLE BELOW. 3. Lymph node, biopsy, Level 11 #2 - THERE IS NO EVIDENCE OF CARCINOMA IN ONE OF ONE LYMPH NODE (0/1).  Treatments: surgery:   1. Left muscle-sparing thoracotomy 2. Left upper lobectomy  Disposition: 01-Home or Self Care   Discharge Medications:     Medication List    TAKE these medications        CALCIUM 600 + D PO  Take 600 mg by mouth daily.     carvedilol 25 MG tablet  Commonly known as:  COREG  Take 1 tablet (25 mg total) by mouth 2 (two) times daily with a meal.     guaiFENesin 600 MG 12 hr tablet  Commonly known as:  MUCINEX  Take 1 tablet (600 mg total) by mouth 2 (two) times daily as needed for cough or to loosen phlegm.     ibuprofen 200 MG tablet  Commonly known as:  ADVIL,MOTRIN  Take 200 mg by mouth every 6 (six) hours as needed for mild pain or moderate pain.     multivitamin with minerals Tabs tablet  Take 2 tablets by mouth daily.     oxyCODONE-acetaminophen 5-325 MG tablet  Commonly known as:  PERCOCET/ROXICET  Take 0.5 tablets by mouth 2 (two) times daily as needed (pain).     predniSONE 20 MG tablet  Commonly known as:  DELTASONE  Take 60 mg by mouth See admin instructions. Take 3 tablets (60 mg) daily for 20 days whenever experiencing an episode of hearing loss     rosuvastatin 10 MG tablet  Commonly known as:  CRESTOR  Take 5 mg by mouth every Monday, Wednesday, and Friday. In AM     telmisartan-hydrochlorothiazide 40-12.5 MG tablet  Commonly known as:  MICARDIS HCT  TAKE 1/2 TABLET DAILY.     traMADol 50 MG tablet  Commonly known as:  ULTRAM  Take  1 tablet (50 mg total) by mouth every 6 (six) hours as needed for moderate pain.       Follow-up Information    Follow up with Gaye Pollack, MD On 06/13/2015.   Specialty:  Cardiothoracic Surgery   Why:  Appointment is at 12:00   Contact information:   [address] [place] [address] [phone]       Follow up with [facility].   Why:  Please get CXR at 11:30   Contact information:   [facility]       Follow up with [facility] [place] On 06/04/2015.   Specialty:  Cardiothoracic Surgery   Why:  For suture removal at 9:30   Contact information:   [address] [place], [place] [phone]  SignedEllwood Handler 05/25/2015, 10:20 AM

## 2015-05-24 NOTE — Discharge Instructions (Signed)
Thoracotomy, Care After °Refer to this sheet in the next few weeks. These instructions provide you with information on caring for yourself after your procedure. Your health care provider may also give you more specific instructions. Your treatment has been planned according to current medical practices, but problems sometimes occur. Call your health care provider if you have any problems or questions after your procedure. °WHAT TO EXPECT AFTER YOUR PROCEDURE °After your procedure, it is typical to have the following sensations: °· You may feel pain at the incision site. °· You may be constipated from the pain medicine given and the change in your level of activity. °· You may feel extremely tired. °HOME CARE INSTRUCTIONS °· Take over-the-counter or prescription medicines for pain, discomfort, or fever only as directed by your health care provider. It is very important to take pain relieving medicine before your pain becomes severe. You will be able to breathe and cough more comfortably if your pain is well controlled. °· Take deep breaths. Deep breathing helps to keep your lungs inflated and protects against a lung infection (pneumonia). °· Cough frequently. Even though coughing may cause discomfort, coughing is important to clear mucus (phlegm) and expand your lungs. Coughing helps prevent pneumonia. If it hurts to cough, hold a pillow against your chest when you cough. This may help with the discomfort. °· Continue to use an incentive spirometer as directed. The use of an incentive spirometer helps to keep your lungs inflated and protects against pneumonia. °· Change the bandages over your incision as needed or as directed by your health care provider. °· Remove the bandages over your chest tube site as directed by your health care provider. °· Resume your normal diet as directed. It is important to have adequate protein, calories, vitamins, and minerals to promote healing. °· Prevent constipation. °¨ Eat  high-fiber foods such as whole grain cereals and breads, brown rice, beans, and fresh fruits and vegetables. °¨ Drink enough water and fluids to keep your urine clear or pale yellow. Avoid drinking beverages containing caffeine. Beverages containing caffeine can cause dehydration and harden your stool. °¨ Talk to your health care provider about taking a stool softener or laxative. °· Avoid lifting until you are instructed otherwise. °· Do not drive until directed by your health care provider.  Do not drive while taking pain medicines (narcotics). °· Do not bathe, swim, or use a hot tub until directed by your health care provider. You may shower instead. Gently wash the area of your incision with water and soap as directed. Do not use anything else to clean your incision except as directed by your health care provider. °· Do not use any tobacco products including cigarettes, chewing tobacco, or electronic cigarettes. °· Avoid secondhand smoke. °· Schedule an appointment for stitch (suture) or staple removal as directed. °· Schedule and attend all follow-up visits as directed by your health care provider. It is important to keep all your appointments. °· Participate in pulmonary rehabilitation as directed by your health care provider. °· Do not travel by airplane for 2 weeks after your chest tube is removed. °SEEK MEDICAL CARE IF: °· You are bleeding from your wounds. °· Your heartbeat seems irregular. °· You have redness, swelling, or increasing pain in the wounds. °· There is pus coming from your wounds. °· There is a bad smell coming from the wound or dressing. °· You have a fever or chills. °· You have nausea or are vomiting. °· You have muscle aches. °SEEK   IMMEDIATE MEDICAL CARE IF:  You have a rash.  You have difficulty breathing.  You have a reaction or side effect to medicines given.  You have persistent nausea.  You have lightheadedness or feel faint.  You have shortness of breath or chest  pain.  You have persistent pain.   This information is not intended to replace advice given to you by your health care provider. Make sure you discuss any questions you have with your health care provider.   Document Released: 11/22/2010 Document Revised: 10/24/2014 Document Reviewed: 01/26/2013 Elsevier Interactive Patient Education [employer].

## 2015-05-25 ENCOUNTER — Inpatient Hospital Stay (HOSPITAL_COMMUNITY): Payer: BLUE CROSS/BLUE SHIELD

## 2015-05-25 ENCOUNTER — Encounter: Payer: Self-pay | Admitting: Internal Medicine

## 2015-05-25 MED ORDER — GUAIFENESIN ER 600 MG PO TB12: 600.0000 mg | ORAL_TABLET | Freq: Two times a day (BID) | ORAL | Status: DC | PRN

## 2015-05-25 MED ORDER — TRAMADOL HCL 50 MG PO TABS: 50.0000 mg | ORAL_TABLET | Freq: Four times a day (QID) | ORAL | Status: DC | PRN

## 2015-05-25 MED ORDER — CARVEDILOL 25 MG PO TABS: 25.0000 mg | ORAL_TABLET | Freq: Two times a day (BID) | ORAL | Status: DC

## 2015-05-25 NOTE — Progress Notes (Signed)
      [address]       [place], [address]             [phone]      4 Days Post-Op Procedure(s) (LRB): LEFT THORACOTOMY/LEFT UPPER LOBECTOMY (Left)   Subjective:  Mr. Clute has no complaints.  He states he had a good night.  He slept well and is ready to go home.  + ambulation  + BM  Objective: Vital signs in last 24 hours: Temp:  [98.5 F (36.9 C)-98.8 F (37.1 C)] 98.8 F (37.1 C) (12/02 0605) Pulse Rate:  [73] 73 (12/02 4431) Cardiac Rhythm:  [-] Normal sinus rhythm (12/02 0700) Resp:  [20-21] 20 (12/02 0605) BP: (136-158)/(74-83) 158/83 mmHg (12/02 0605) SpO2:  [97 %] 97 % (12/02 0605)  Intake/Output from previous day: 12/01 0701 - 12/02 0700 In: 480 [P.O.:480] Out: 300 [Urine:300]  General appearance: alert, cooperative and no distress Heart: regular rate and rhythm Lungs: clear to auscultation bilaterally Abdomen: soft, non-tender; bowel sounds normal; no masses,  no organomegaly Wound: clean and dry, ecchymosis present  Lab Results: No results for input(s): WBC, HGB, HCT, PLT in the last 72 hours. BMET: No results for input(s): NA, K, CL, CO2, GLUCOSE, BUN, CREATININE, CALCIUM in the last 72 hours.  PT/INR: No results for input(s): LABPROT, INR in the last 72 hours. ABG    Component Value Date/Time   PHART 7.449 05/22/2015 0218   HCO3 17.8* 05/22/2015 0218   TCO2 18.6 05/22/2015 0218   ACIDBASEDEF 5.5* 05/22/2015 0218   O2SAT 96.8 05/22/2015 0218   CBG (last 3)  No results for input(s): GLUCAP in the last 72 hours.  Assessment/Plan: S/P Procedure(s) (LRB): LEFT THORACOTOMY/LEFT UPPER LOBECTOMY (Left)  1. Pulm- left sided pneumothorax remains stable, sub-q air present has improved 2. CV- HTN, Coreg increased yesterday, continue home medications 3. Dispo- patient is medically stable, will discharge home today   LOS: 4 days    Ahmed Prima, Cadie Sorci 05/25/2015

## 2015-05-28 ENCOUNTER — Telehealth: Payer: Self-pay | Admitting: *Deleted

## 2015-05-28 NOTE — Telephone Encounter (Signed)
Oncology Nurse Navigator Documentation  Oncology Nurse Navigator Flowsheets 05/28/2015  Referral date to RadOnc/MedOnc 05/25/2015  Navigator Encounter Type Telephone/I received a referral from Dr. Vivi Martens office today.  He would like patient to be seen by Rad Onc. I called and spoke with patient today regarding appt.  He is scheduled to see Rad Onc on 05/31/15 arrive at 2:45.  He verbalized understanding of appt time and place.   Patient Visit Type Initial  Treatment Phase Abnormal Scans  Interventions Coordination of Care  Coordination of Care MD Appointments  Time Spent with Patient 15

## 2015-05-30 ENCOUNTER — Telehealth: Payer: Self-pay | Admitting: *Deleted

## 2015-05-30 NOTE — Telephone Encounter (Signed)
Called and confirmed 05/31/15 clinic appt w/ pt.

## 2015-05-31 ENCOUNTER — Ambulatory Visit
Admission: RE | Admit: 2015-05-31 | Discharge: 2015-05-31 | Disposition: A | Discharge status: Home / Self Care | Payer: BLUE CROSS/BLUE SHIELD | Source: Ambulatory Visit | Attending: Radiation Oncology | Admitting: Radiation Oncology

## 2015-05-31 ENCOUNTER — Ambulatory Visit: Payer: BLUE CROSS/BLUE SHIELD | Attending: Radiation Oncology | Admitting: Physical Therapy

## 2015-05-31 ENCOUNTER — Encounter: Payer: Self-pay | Admitting: *Deleted

## 2015-05-31 VITALS — BP 163/72 | HR 66 | Temp 98.5°F | Resp 18 | Wt 197.3 lb

## 2015-05-31 DIAGNOSIS — C3412 Malignant neoplasm of upper lobe, left bronchus or lung: Secondary | ICD-10-CM

## 2015-05-31 DIAGNOSIS — R5381 Other malaise: Secondary | ICD-10-CM | POA: Diagnosis present

## 2015-05-31 NOTE — Progress Notes (Signed)
Radiation Oncology         [phone]) [phone] ________________________________  Initial Outpatient Consultation  Name: George Montoya. MRN: [id number]  Date: 05/31/2015  DOB: 08/09/1940  BT:DVVOHYWV,PXTGGY G, MD  Cyndia Bent Fernande Boyden, MD   REFERRING PHYSICIAN: Gaye Pollack, MD  DIAGNOSIS: The encounter diagnosis was Primary cancer of left upper lobe of lung ([place]).  Stage 1b Well Differentiated Adenocarcinoma of the Left Lung  HISTORY OF PRESENT ILLNESS::George Montoya. is a 74 y.o. male who is seen out courtesy of Dr. Cyndia Bent for an opinion concerning radiation therapy as part of management of patient's recently diagnosed lung cancer.  The patient presented to his PCP in March 2016 for lower back pain. CT of the abdomen and chest on 08/30/14 showed a left upper lobe 2.8 x 1.7 x 1.4 cm irregular mass. PET scan on 09/14/14 noted a weakly hypermetabolic solitary left upper lobe nodule measuring 17 x 15 mm. He was referred to Dr. Chase Caller on 10/02/14. On 10/16/14, he had a CT chest super D that showed a 2.1 x 1.5 cm LUL nodule. He underwent bronchoscopy and ENB with biopsies per Dr. Lamonte Sakai on 10/18/14, noting it as focal atypia. Repeat chest CT on 01/24/15 that showed the LUL part solid nodule measuring 2.2 x 2.1 cm with an internal solid component measuring 17 cm.  No new pulmonary nodules were identified. A repeat CT scan on 05/01/15 showed that the lesion has increased in size to 3.1 x 2.3 cm and was suspicious for a slow-growing neoplasm. The patient had a left upper lobectomy per Dr. Cyndia Bent on 05/21/15. The left upper lobe was positive for adenocarcinoma, well  differentiated, spanning 2.6 cm. Adenocarcinoma was present at the stapled parenchymal resection margin. Two biopsied level 11 lymph nodes were negative. Adjacent to the main tumor nodule was a smaller nodule measuring less than 0.3 cm. The patient was referred to me by Dr. Cyndia Bent consider postoperative radiation therapy given the positive surgical  margin.  PREVIOUS RADIATION THERAPY: No  PAST MEDICAL HISTORY:  has a past medical history of High cholesterol; Hypertension; Bowel obstruction ([place]); Aortic aneurysm ([place]); Shortness of breath dyspnea; GERD (gastroesophageal reflux disease); Scoliosis deformity of spine; Meniere's disease of left ear; Shingles; Headache; Arthritis; Sleep related leg cramps; Melanoma ([place]); Shingles; and Syncopal episodes.    PAST SURGICAL HISTORY: Past Surgical History  Procedure Laterality Date  . Melanoma excision    . Hernia repair    . Appendectomy    . Tonsillectomy    . Cataract extraction w/ intraocular lens  implant, bilateral    . Colon surgery      6 YRS AGO  "SNIPPED BAND BINDING INTESTINE"  . Video bronchoscopy with endobronchial navigation N/A 10/18/2014    Procedure: VIDEO BRONCHOSCOPY WITH ENDOBRONCHIAL NAVIGATION;  Surgeon: Collene Gobble, MD;  Location: [facility];  Service: Thoracic;  Laterality: N/A;  . Surgery for bowel obstruction    . Pilonadal cyst removed    . Vasectomy    . Colonoscopy    . Thoracotomy/lobectomy Left 05/21/2015    Procedure: LEFT THORACOTOMY/LEFT UPPER LOBECTOMY;  Surgeon: Gaye Pollack, MD;  Location[facility];  Service: Thoracic;  Laterality: Left;    FAMILY HISTORY: family history is not on file.  SOCIAL HISTORY:  reports that he quit smoking about 33 years ago. His smoking use included Cigarettes. He has a 50 pack-year smoking history. He has never used smokeless tobacco. He reports that he drinks about 12.0 oz of alcohol  per week. He reports that he does not use illicit drugs.  ALLERGIES: Dust mite extract  MEDICATIONS:  Current Outpatient Prescriptions  Medication Sig Dispense Refill  . Calcium Carb-Cholecalciferol (CALCIUM 600 + D PO) Take 600 mg by mouth daily.    . carvedilol (COREG) 25 MG tablet Take 1 tablet (25 mg total) by mouth 2 (two) times daily with a meal. 60 tablet 3  . guaiFENesin (MUCINEX) 600 MG 12 hr tablet Take 1 tablet (600 mg total) by  mouth 2 (two) times daily as needed for cough or to loosen phlegm.    Marland Kitchen ibuprofen (ADVIL,MOTRIN) 200 MG tablet Take 200 mg by mouth every 6 (six) hours as needed for mild pain or moderate pain.    . Multiple Vitamin (MULTIVITAMIN WITH MINERALS) TABS tablet Take 2 tablets by mouth daily.    Marland Kitchen oxyCODONE-acetaminophen (PERCOCET/ROXICET) 5-325 MG per tablet Take 0.5 tablets by mouth 2 (two) times daily as needed (pain).    . predniSONE (DELTASONE) 20 MG tablet Take 60 mg by mouth See admin instructions. Take 3 tablets (60 mg) daily for 20 days whenever experiencing an episode of hearing loss    . rosuvastatin (CRESTOR) 10 MG tablet Take 5 mg by mouth every Monday, Wednesday, and Friday. In AM    . telmisartan-hydrochlorothiazide (MICARDIS HCT) 40-12.5 MG tablet TAKE 1/2 TABLET DAILY. 15 tablet 10  . traMADol (ULTRAM) 50 MG tablet Take 1 tablet (50 mg total) by mouth every 6 (six) hours as needed for moderate pain. 30 tablet 0   No current facility-administered medications for this encounter.    REVIEW OF SYSTEMS:  A 15 point review of systems is documented in the electronic medical record. This was obtained by the nursing staff. However, I reviewed this with the patient to discuss relevant findings and make appropriate changes.  Pertinent items are noted in HPI.   The patient denies pain, shortness of breath or cough. Denies numbness or tingling of his left arm. He has no complaints at this time. Recovering quickly from his surgery   PHYSICAL EXAM:  weight is 197 lb 4.8 oz (89.495 kg). His temperature is 98.5 F (36.9 C). His blood pressure is 163/72 and his pulse is 66. His respiration is 18 and oxygen saturation is 99%.   General: Alert and oriented, in no acute distress, accompanied by his wife on evaluation today HEENT: Head is normocephalic. Extraocular movements are intact. Oropharynx is clear. Neck: Neck is supple, no palpable cervical or supraclavicular lymphadenopathy. Heart: Regular in  rate and rhythm with no murmurs, rubs, or gallops. Chest: Clear to auscultation bilaterally, with no rhonchi, wheezes, or rales.   scar from his recent lobectomy in the left upper lateral chest. Another scar in his right upper back from a melanoma excision. Abdomen: Soft, nontender, nondistended, with no rigidity or guarding. Extremities: No cyanosis or edema. Lymphatics: see Neck Exam Skin: No concerning lesions. Macular papular red bumps throughout his trunk that he experiences every winter. Musculoskeletal: symmetric strength and muscle tone throughout. Neurologic: Cranial nerves II through XII are grossly intact. No obvious focalities. Speech is fluent. Coordination is intact. Psychiatric: Judgment and insight are intact. Affect is appropriate.  ECOG = 1  LABORATORY DATA:  Lab Results  Component Value Date   WBC 10.0 05/22/2015   HGB 12.0* 05/22/2015   HCT 34.9* 05/22/2015   MCV 100.0 05/22/2015   PLT 181 05/22/2015   Lab Results  Component Value Date   NA 131* 05/22/2015   K 4.1  05/22/2015   CL 99* 05/22/2015   CO2 24 05/22/2015   GLUCOSE 104* 05/22/2015   CREATININE 0.77 05/22/2015   CALCIUM 8.6* 05/22/2015      RADIOGRAPHY: Dg Chest 2 View  05/25/2015  CLINICAL DATA:  Status post left lobectomy, clinical improvement. EXAM: CHEST  2 VIEW COMPARISON:  PA and lateral chest x-ray of May 24, 2015 FINDINGS: There is a stable 15-20% left-sided pneumothorax. There is extensive subcutaneous emphysema and there is an air-fluid level within the axillary soft tissues which may reflect a hematoma. The right lung is mildly hypoinflated but clear. There is no mediastinal shift. The heart and pulmonary vascularity are normal. The bony thorax is unremarkable. IMPRESSION: Stable 15% to 20% left-sided pneumothorax. Extensive subcutaneous emphysema is better demonstrated today due to patient positioning. There is a cavity containing air and fluid in the left axillary soft tissues likely  reflecting a hematoma communicating with the pleural space. These findings were called by telephone at the time of interpretation on 05/25/2015 at 7:56 am to Grant Fontana, RN,, who verbally acknowledged these results. Electronically Signed   By: David  Martinique M.D.   On: 05/25/2015 07:59   Dg Chest 2 View  05/24/2015  CLINICAL DATA:  Status post left upper lobectomy for resection of nodule EXAM: CHEST  2 VIEW COMPARISON:  Portable chest x-ray of 05/23/2015 FINDINGS: The U left chest tube has been removed. There is now a left apical pneumothorax of approximately 15-20%. Left chest wall subcutaneous air is noted. Opacity medially in the left upper lobe most likely is postoperative in nature. The right lung is clear. No effusion is seen. Heart size is stable. IMPRESSION: 1. Left chest tube removed. There is now left apical pneumothorax of 15-20%. 2. Opacity medially in the left upper lobe most likely is postoperative. Recommend followup. Electronically Signed   By: Ivar Drape M.D.   On: 05/24/2015 08:05   Dg Chest 2 View  05/21/2015  CLINICAL DATA:  Left upper lobe pulmonary nodule. EXAM: CHEST  2 VIEW COMPARISON:  CT 05/01/2015 FINDINGS: The left upper lobe nodule is superimposed on the second rib end. The lungs are otherwise clear. The pulmonary vasculature is normal. There is no large effusion. IMPRESSION: Left upper lobe pulmonary nodule. No acute cardiopulmonary findings. Electronically Signed   By: Andreas Newport M.D.   On: 05/21/2015 06:44   Dg Chest Port 1 View  05/23/2015  CLINICAL DATA:  Left upper lobe wedge resection. EXAM: PORTABLE CHEST 1 VIEW COMPARISON:  05/22/2015. FINDINGS: Interim removal of the medial-most left chest tube. Lateral most left chest tube in stable position. Interim removal of right IJ line. No pneumothorax. Postsurgical changes left upper lung. Stable cardiomegaly. Improving left chest wall subcutaneous emphysema . IMPRESSION: 1. Interim removal of medial most left chest  tube. Lateral most left chest tube in stable position. Interim removal of right IJ line . No pneumothorax. Improving left chest wall subcutaneous emphysema. 2. Postsurgical changes left upper lung. 3. Stable cardiomegaly. Electronically Signed   By: Marcello Moores  Register   On: 05/23/2015 07:40   Dg Chest Port 1 View  05/22/2015  CLINICAL DATA:  Status post left upper lobectomy for lung mass. EXAM: PORTABLE CHEST 1 VIEW COMPARISON:  Portable chest x-ray of May 21, 2015 FINDINGS: The lungs are mildly hypoinflated. No pneumothorax on the left is observed. There are 2 chest tubes present on the left which are unchanged in position. Neither lung exhibits alveolar infiltrates or pleural effusions. The cardiac silhouette remains enlarged.  The mediastinum is mildly widened but stable. The pulmonary vascularity is not clearly engorged. The right internal jugular venous catheter tip projects over the midportion of the SVC. There is a moderate amount of subcutaneous emphysema in the left axillary region. IMPRESSION: 1. No pneumothorax or significant pleural effusion. 2. Bilateral hypo inflation, stable. Stable mild cardiomegaly without significant pulmonary edema. 3. Stable positioning of the left-sided chest tubes and right internal jugular venous catheter. Electronically Signed   By: David  Martinique M.D.   On: 05/22/2015 07:50   Dg Chest Port 1 View  05/21/2015  CLINICAL DATA:  Post op film for left VATS, check chest tube position EXAM: PORTABLE CHEST 1 VIEW COMPARISON:  05/12/2014 FINDINGS: Right internal jugular line terminates at the high SVC. Midline trachea. Normal heart size. Placement of 2 left-sided chest tubes. Moderate subcutaneous emphysema about the left chest wall. No pneumothorax. Clear right lung. No pleural fluid. IMPRESSION: Interval placement of 2 left-sided chest tubes with subcutaneous emphysema but no evidence of pneumothorax. Electronically Signed   By: Abigail Miyamoto M.D.   On: 05/21/2015 12:19       IMPRESSION: Stage 1b Well Differentiated Adenocarcinoma of the Left Lung. Pathology significant for adenocarcinoma present at the stapled resection margin. Given this positive surgical margin patient would be at risk for local recurrence and would likely benefit from postoperative radiation therapy. Patient does wish to be aggressive with management of his early stage lung cancer and wishes to proceed with recommendations concerning postoperative treatment.  PLAN: We discussed his diagnosis and stage. We discussed radiation in the management of her disease. We discussed 6 weeks of treatment possibly less depending on treatment set up,as an outpatient. We discussed the process of simulation and the placement of tattoos. We discussed dysphagia, weight loss and fatigue as the acute side effects of radiation. We discussed damage to critical normal structures in the chest as well as the spinal cord as possible but improbably. We will schedule him for CT sim in the next few weeks and begin treatments 5-6 weeks post-op.     ------------------------------------------------  Blair Promise, PhD, MD  This document serves as a record of services personally performed by Gery Pray, MD. It was created on his behalf by Darcus Austin, a trained medical scribe. The creation of this record is based on the scribe's personal observations and the provider's statements to them. This document has been checked and approved by the attending provider.

## 2015-05-31 NOTE — Progress Notes (Signed)
Oncology Nurse Navigator Documentation  Oncology Nurse Navigator Flowsheets 05/31/2015  Navigator Encounter Type Clinic/MDC/spoke with patient and wife today.  Gave and explained information on lung cancer and supportive services and resources.  I updated them on rad onc will be calling with appt for SIM and tx.   Patient Visit Type Initial  Treatment Phase Abnormal Scans  Barriers/Navigation Needs Education  Education Newly Diagnosed Cancer Education  Interventions Education Method  Coordination of Care -  Education Method Verbal;Written  Time Spent with Patient 15

## 2015-05-31 NOTE — Therapy (Signed)
[place], [place], [address] Phone: [phone]   Fax:  [phone]  Physical Therapy Evaluation  Patient Details  Name: George Montoya. MRN: [id number] Date of Birth: 12/26/1940 Referring Provider: Dr. Gery Pray  Encounter Date: 05/31/2015      PT End of Session - 05/31/15 1556    Visit Number 1   Number of Visits 1   PT Start Time 3212   PT Stop Time 1537   PT Time Calculation (min) 22 min   Activity Tolerance Patient tolerated treatment well   Behavior During Therapy [facility] for tasks assessed/performed      Past Medical History  Diagnosis Date  . High cholesterol   . Hypertension   . Bowel obstruction ([place])   . Aortic aneurysm ([facility])     DR BARTLE    JUST WATCHING   . Shortness of breath dyspnea     WITH EXERTION   . GERD (gastroesophageal reflux disease)   . Scoliosis deformity of spine   . Meniere's disease of left ear   . Shingles     04/2014     RIGHT EYE, SCALP  . Headache     hx of 2 migraines as a college roommates  . Arthritis     spine  . Sleep related leg cramps   . Melanoma ([place])     also basal cell carcinomas  . Shingles     right eye, forehead and head  . Syncopal episodes     x 1 in October, 2015.    Past Surgical History  Procedure Laterality Date  . Melanoma excision    . Hernia repair    . Appendectomy    . Tonsillectomy    . Cataract extraction w/ intraocular lens  implant, bilateral    . Colon surgery      6 YRS AGO  "SNIPPED BAND BINDING INTESTINE"  . Video bronchoscopy with endobronchial navigation N/A 10/18/2014    Procedure: VIDEO BRONCHOSCOPY WITH ENDOBRONCHIAL NAVIGATION;  Surgeon: Collene Gobble, MD;  Location: [facility];  Service: Thoracic;  Laterality: N/A;  . Surgery for bowel obstruction    . Pilonadal cyst removed    . Vasectomy    . Colonoscopy    . Thoracotomy/lobectomy Left 05/21/2015    Procedure: LEFT THORACOTOMY/LEFT UPPER LOBECTOMY;   Surgeon: Gaye Pollack, MD;  Location: [facility];  Service: Thoracic;  Laterality: Left;    There were no vitals filed for this visit.  Visit Diagnosis:  Physical deconditioning - Plan: PT plan of care cert/re-cert      Subjective Assessment - 05/31/15 1544    Subjective reports some shortness of breath with strenuous activities   Patient is accompained by: Family member  spouse   Pertinent History Incidental finding of left upper lobe lung cancer with workup for back pain.  Had left thoracotomy with left upper lobectomy 05/21/15; margins were positive; probably stage IB; type is adenocarcinoma; patient is expected to have radiation treatment.  Pt. is an ex-smoker who quit 1983 after 50 pack-years; HTN, melanoma s/p resection; scoliosis which he generally controls with core exercises; Meniere's Disease.   Patient Stated Goals get information from all lung clinic providers   Currently in Pain? No/denies            [facility] PT Assessment - 05/31/15 0001    Assessment   Medical Diagnosis left upper lobe adenocarcinoma   Referring Provider Dr. Gery Pray   Onset Date/Surgical  Date 05/21/15  for thoracotomy/lobectomy   Next MD Visit 06/13/15 with surgeon   Precautions   Precautions Other (comment)   Precaution Comments cancer precautions   Restrictions   Weight Bearing Restrictions No   Balance Screen   Has the patient fallen in the past 6 months No   Has the patient had a decrease in activity level because of a fear of falling?  No   Is the patient reluctant to leave their home because of a fear of falling?  No   Home Environment   Living Environment Private residence   Living Arrangements Spouse/significant other   Type of [place] Two level   Prior Function   Level of [place] to walk 7,000-10,000 steaps a day; does core strengthening daily and free weights at times.   Cognition   Overall Cognitive Status Within Functional  Limits for tasks assessed   Observation/Other Assessments   Observations Pleasant, well-looking man who looks younger than his stated age.   Functional Tests   Functional tests --   Sit to Stand   Comments 14 times in 30 seconds (average for age)  mild SOB after this   Posture/Postural Control   Posture/Postural Control No significant limitations   ROM / Strength   AROM / PROM / Strength AROM   AROM   Overall AROM Comments Trunk AROM in standing:  in forward flexion, reaches fingertips 8 inches to floor; all other motions WFL.   Ambulation/Gait   Ambulation/Gait Yes   Ambulation/Gait Assistance 7: Independent   Balance   Balance Assessed Yes   Dynamic Standing Balance   Dynamic Standing - Comments reaches 9 inches forward in standing  below average for age                           PT Education - 05/31/15 1556    Education provided Yes   Education Details energy conservation, walking, posture, breathing, C[employer] article on staying active, PT info   Person(s) Educated Patient;Spouse   Methods Explanation;Handout   Comprehension Verbalized understanding               Lung Clinic Goals - 05/31/15 1559    Patient will be able to verbalize understanding of the benefit of exercise to decrease fatigue.   Status Achieved   Patient will be able to verbalize the importance of posture.   Status Achieved   Patient will be able to demonstrate diaphragmatic breathing for improved lung function.   Status Achieved   Patient will be able to verbalize understanding of the role of physical therapy to prevent functional decline and who to contact if physical therapy is needed.   Status Achieved             Plan - 05/31/15 1557    Clinical Impression Statement Patient who is s/p left thoracotomy with left upper lobectomy 05/21/15, diagnosed with adenocarcinoma, probably stage IB and expected to have radiation treatment due to positive surgical margins.  His main  limitation today is mild shortness of breath with exertion.   Pt will benefit from skilled therapeutic intervention in order to improve on the following deficits Cardiopulmonary status limiting activity   Rehab Potential Excellent   PT Frequency One time visit   PT Treatment/Interventions Patient/family education   PT Next Visit Plan None at this time.   PT Home Exercise Plan see education section   Consulted  and Agree with Plan of Care Patient          G-Codes - 06/11/15 1559    Functional Assessment Tool Used clinical judgement   Functional Limitation Mobility: Walking and moving around   Mobility: Walking and Moving Around Current Status [phone]) At least 1 percent but less than 20 percent impaired, limited or restricted   Mobility: Walking and Moving Around Goal Status [phone]) At least 1 percent but less than 20 percent impaired, limited or restricted   Mobility: Walking and Moving Around Discharge Status [phone]) At least 1 percent but less than 20 percent impaired, limited or restricted       Problem List Patient Active Problem List   Diagnosis Date Noted  . S/P lobectomy of lung 05/21/2015  . Coronary artery calcification seen on CAT scan 05/14/2015  . Preoperative cardiovascular examination 05/14/2015  . Hyperlipidemia 05/14/2015  . Nodule of left lung 10/02/2014  . Stopped smoking with greater than 40 pack year history 10/02/2014  . Syncope 03/21/2014  . HTN (hypertension) 03/21/2014    George Montoya 11-Jun-2015, 4:02 PM  [place], [place], [address] Phone: [phone]   Fax:  [phone]  Name: George Montoya. MRN: [id number] Date of Birth: 1941/02/08   Serafina Royals, PT June 11, 2015 4:02 PM

## 2015-06-04 ENCOUNTER — Encounter (INDEPENDENT_AMBULATORY_CARE_PROVIDER_SITE_OTHER): Payer: Self-pay

## 2015-06-04 DIAGNOSIS — R911 Solitary pulmonary nodule: Secondary | ICD-10-CM

## 2015-06-11 ENCOUNTER — Other Ambulatory Visit: Payer: Self-pay | Admitting: Surgery

## 2015-06-11 DIAGNOSIS — C3490 Malignant neoplasm of unspecified part of unspecified bronchus or lung: Secondary | ICD-10-CM

## 2015-06-13 ENCOUNTER — Ambulatory Visit
Admission: RE | Admit: 2015-06-13 | Discharge: 2015-06-13 | Disposition: A | Discharge status: Home / Self Care | Payer: BLUE CROSS/BLUE SHIELD | Source: Ambulatory Visit | Attending: Surgery | Admitting: Surgery

## 2015-06-13 ENCOUNTER — Encounter: Payer: Self-pay | Admitting: Gastroenterology

## 2015-06-13 ENCOUNTER — Encounter: Payer: Self-pay | Admitting: Surgery

## 2015-06-13 ENCOUNTER — Ambulatory Visit (INDEPENDENT_AMBULATORY_CARE_PROVIDER_SITE_OTHER): Payer: Self-pay | Admitting: Surgery

## 2015-06-13 VITALS — BP 149/90 | HR 76 | Resp 20 | Ht 68.0 in | Wt 197.0 lb

## 2015-06-13 DIAGNOSIS — Z9889 Other specified postprocedural states: Secondary | ICD-10-CM

## 2015-06-13 DIAGNOSIS — R911 Solitary pulmonary nodule: Secondary | ICD-10-CM

## 2015-06-13 DIAGNOSIS — Z902 Acquired absence of lung [part of]: Secondary | ICD-10-CM

## 2015-06-13 DIAGNOSIS — C3490 Malignant neoplasm of unspecified part of unspecified bronchus or lung: Secondary | ICD-10-CM

## 2015-06-14 ENCOUNTER — Encounter: Payer: Self-pay | Admitting: Surgery

## 2015-06-14 NOTE — Progress Notes (Signed)
HPI: Patient returns for routine postoperative follow-up having undergone left muscle sparing thoracotomy and left upper lobectomy on 05/21/2015. The patient's early postoperative recovery while in the hospital was notable for an uncomplicated postop course. The final pathology showed stage 1b well-differentiated adenocarcinoma of the left lung. Unfortunately there was essentially no fissure between the upper and lower lobes and there was still adenocarcinoma at the stapled resection margin. I did not feel that it was possible to remove this residual disease without doing a pneumonectomy, which I did not feel that he was a candidate for at 74 years old. Since hospital discharge the patient reports that he feels well and has minimal pain. He has seen Dr. Sondra Come and is planning to start XRT about 6 weeks postop to treat the residual disease.   Current Outpatient Prescriptions  Medication Sig Dispense Refill  . Calcium Carb-Cholecalciferol (CALCIUM 600 + D PO) Take 600 mg by mouth daily.    . carvedilol (COREG) 25 MG tablet Take 1 tablet (25 mg total) by mouth 2 (two) times daily with a meal. 60 tablet 3  . guaiFENesin (MUCINEX) 600 MG 12 hr tablet Take 1 tablet (600 mg total) by mouth 2 (two) times daily as needed for cough or to loosen phlegm.    Marland Kitchen ibuprofen (ADVIL,MOTRIN) 200 MG tablet Take 200 mg by mouth every 6 (six) hours as needed for mild pain or moderate pain.    . Multiple Vitamin (MULTIVITAMIN WITH MINERALS) TABS tablet Take 2 tablets by mouth daily.    . predniSONE (DELTASONE) 20 MG tablet Take 60 mg by mouth See admin instructions. Take 3 tablets (60 mg) daily for 20 days whenever experiencing an episode of hearing loss    . rosuvastatin (CRESTOR) 10 MG tablet Take 5 mg by mouth every Monday, Wednesday, and Friday. In AM    . telmisartan-hydrochlorothiazide (MICARDIS HCT) 40-12.5 MG tablet TAKE 1/2 TABLET DAILY. 15 tablet 10  . traMADol (ULTRAM) 50 MG tablet Take 1 tablet (50 mg  total) by mouth every 6 (six) hours as needed for moderate pain. 30 tablet 0   No current facility-administered medications for this visit.    Physical Exam: BP 149/90 mmHg  Pulse 76  Resp 20  Ht '5\' 8"'$  (1.727 m)  Wt 197 lb (89.359 kg)  BMI 29.96 kg/m2  SpO2 98% He looks well Lungs are clear The chest incision is healing well  Diagnostic Tests:  CLINICAL DATA: Status post left upper lobectomy for lung adenocarcinoma on 05/21/2015. Productive cough and shortness of breath.  EXAM: CHEST 2 VIEW  COMPARISON: 05/25/2015 chest radiograph.  FINDINGS: Stable cardiomediastinal silhouette with normal heart size. The patient is status post left upper lobectomy, with associated volume loss in the left hemithorax. Mediastinum is midline. No residual pneumothorax. There is mild pleural fluid/ thickening in the peripheral apical left pleural space. No pulmonary edema. No basilar pleural effusion. Mild patchy opacity in the upper parahilar left lower lobe. Otherwise clear lungs.  IMPRESSION: 1. No residual pneumothorax. 2. Mild pleural fluid/thickening in the peripheral apical left pleural space. 3. Status post left upper lobectomy. Mild patchy opacity in the upper parahilar left lower lobe, favor mild postoperative change (atelectasis/developing scarring), less likely a developing pneumonia. Consider follow-up PA and lateral chest radiographs in 6-8 weeks.   Electronically Signed  By: Ilona Sorrel M.D.  On: 06/13/2015 12:18  Impression:  Overall he has made a great recovery following surgery. He will need XRT to treat residual disease at  the stapled margin but will have to wait 6 weeks postop to be sure that this long staple line is healed. I told him that he can gradually increase his activity but should not do any heavy lifting for 8 weeks postop. He will need long term surveillance and Dr. Sondra Come and I can coordinate this so that there is no duplication  studies.  Plan:  He will complete XRT with Dr. Sondra Come and then will need a follow up CT as a new baseline once the radiation changes stabilize. Long term follow up will be coordinated between the two of [place].   Gaye Pollack, MD [facility] [phone]

## 2015-06-26 ENCOUNTER — Ambulatory Visit
Admission: RE | Admit: 2015-06-26 | Discharge: 2015-06-26 | Disposition: A | Discharge status: Home / Self Care | Payer: BLUE CROSS/BLUE SHIELD | Source: Ambulatory Visit | Attending: Radiation Oncology | Admitting: Radiation Oncology

## 2015-06-26 DIAGNOSIS — Z510 Encounter for antineoplastic radiation therapy: Secondary | ICD-10-CM | POA: Diagnosis not present

## 2015-06-26 DIAGNOSIS — C3412 Malignant neoplasm of upper lobe, left bronchus or lung: Secondary | ICD-10-CM | POA: Diagnosis present

## 2015-06-27 NOTE — Progress Notes (Signed)
  Radiation Oncology         [phone]) [phone] ________________________________  Name: [name]. MRN: [id number]  Date: 06/26/2015  DOB: 10/25/1940  SIMULATION AND TREATMENT PLANNING NOTE    ICD-9-CM ICD-10-CM   1. Primary cancer of left upper lobe of lung (HCC) 162.3 C34.12     DIAGNOSIS:  Stage 1b Well Differentiated Adenocarcinoma of the Left Lung with positive surgical margins  NARRATIVE:  The patient was brought to the [place].  Identity was confirmed.  All relevant records and images related to the planned course of therapy were reviewed.  The patient freely provided informed written consent to proceed with treatment after reviewing the details related to the planned course of therapy. The consent form was witnessed and verified by the simulation staff.  Then, the patient was set-up in a stable reproducible  supine position for radiation therapy.  CT images were obtained.  Surface markings were placed.  The CT images were loaded into the planning software.  Then the target and avoidance structures were contoured.  Treatment planning then occurred.  The radiation prescription was entered and confirmed.  Then, I designed and supervised the construction of a total of 5 medically necessary complex treatment devices.  I have requested : 3D Simulation  I have requested a DVH of the following structures: CTV, PTV, lungs, heart, esophagus.  I have ordered:dose calc.  PLAN:  The patient will receive 66 Gy in 33 fractions.  ________________________________  -----------------------------------  Blair Promise, PhD, MD

## 2015-06-28 DIAGNOSIS — Z510 Encounter for antineoplastic radiation therapy: Secondary | ICD-10-CM | POA: Diagnosis not present

## 2015-07-02 ENCOUNTER — Ambulatory Visit
Admission: RE | Admit: 2015-07-02 | Discharge: 2015-07-02 | Disposition: A | Discharge status: Home / Self Care | Payer: BLUE CROSS/BLUE SHIELD | Source: Ambulatory Visit | Attending: Radiation Oncology | Admitting: Radiation Oncology

## 2015-07-02 DIAGNOSIS — C3412 Malignant neoplasm of upper lobe, left bronchus or lung: Secondary | ICD-10-CM

## 2015-07-02 DIAGNOSIS — Z510 Encounter for antineoplastic radiation therapy: Secondary | ICD-10-CM | POA: Diagnosis not present

## 2015-07-02 MED ORDER — RADIAPLEXRX EX GEL
Freq: Once | CUTANEOUS | Status: AC
  Administered 2015-07-02: 11:00:00 via TOPICAL

## 2015-07-02 NOTE — Progress Notes (Signed)
Pt here for patient teaching.  Pt given Radiation and You booklet and Radiaplex gel. Pt reports they have not watched the Radiation Therapy Education video and have been given the link to watch the video at home.  Reviewed areas of pertinence such as fatigue, skin changes and throat changes . Pt able to give teach back of to pat skin and use unscented/gentle soap,apply Radiaplex bid and avoid applying anything to skin within 4 hours of treatment. Pt demonstrated understanding and verbalizes understanding of information given and will contact nursing with any questions or concerns.     [email or web address]

## 2015-07-02 NOTE — Progress Notes (Signed)
  Radiation Oncology         [phone]) [phone] ________________________________  Name: George Montoya. MRN: [id number]  Date: 07/02/2015  DOB: [birth year]  Simulation Verification Note    ICD-9-CM ICD-10-CM   1. Primary cancer of left upper lobe of lung (HCC) 162.3 C34.12     Status: outpatient  NARRATIVE: The patient was brought to the treatment unit and placed in the planned treatment position. The clinical setup was verified. Then port films were obtained and uploaded to the radiation oncology medical record software.  The treatment beams were carefully compared against the planned radiation fields. The position location and shape of the radiation fields was reviewed. They targeted volume of tissue appears to be appropriately covered by the radiation beams. Organs at risk appear to be excluded as planned.  Based on my personal review, I approved the simulation verification. The patient's treatment will proceed as planned.  -----------------------------------  Blair Promise, PhD, MD

## 2015-07-03 ENCOUNTER — Ambulatory Visit
Admission: RE | Admit: 2015-07-03 | Discharge: 2015-07-03 | Disposition: A | Discharge status: Home / Self Care | Payer: BLUE CROSS/BLUE SHIELD | Source: Ambulatory Visit | Attending: Radiation Oncology | Admitting: Radiation Oncology

## 2015-07-03 ENCOUNTER — Encounter: Payer: Self-pay | Admitting: Radiation Oncology

## 2015-07-03 VITALS — BP 147/79 | HR 57 | Temp 98.0°F | Resp 16 | Ht 68.0 in | Wt 195.2 lb

## 2015-07-03 DIAGNOSIS — C3412 Malignant neoplasm of upper lobe, left bronchus or lung: Secondary | ICD-10-CM

## 2015-07-03 DIAGNOSIS — Z510 Encounter for antineoplastic radiation therapy: Secondary | ICD-10-CM | POA: Diagnosis not present

## 2015-07-03 NOTE — Progress Notes (Signed)
George Montoya has completed 1 fraction to his left chest.  He denies pain.  He reports having a cough with clear sputum.  He reports coughing up blood after surgery but has not seen any in over a week.  He denies having shortness of breath.  He has questions about the treatment area as he thinks it is high on his chest.  BP 147/79 mmHg  Pulse 57  Temp(Src) 98 F (36.7 C) (Oral)  Resp 16  Ht '5\' 8"'$  (1.727 m)  Wt 195 lb 3.2 oz (88.542 kg)  BMI 29.69 kg/m2  SpO2 100%

## 2015-07-03 NOTE — Progress Notes (Signed)
  Radiation Oncology         [phone]) [phone] ________________________________  Name: George Montoya. MRN: [id number]  Date: 07/03/2015  DOB: [birth year]  Weekly Radiation Therapy Management    ICD-9-CM ICD-10-CM   1. Primary cancer of left upper lobe of lung (HCC) 162.3 C34.12      Current Dose: 2 Gy     Planned Dose:  66 Gy  Narrative . . . . . . . . The patient presents for routine under treatment assessment.                                   The patient is without complaint. He has mild cough, no chest pain or shortness of breath                                 Set-up films were reviewed.                                 The chart was checked. Physical Findings. . .  height is '5\' 8"'$  (1.727 m) and weight is 195 lb 3.2 oz (88.542 kg). His oral temperature is 98 F (36.7 C). His blood pressure is 147/79 and his pulse is 57. His respiration is 16 and oxygen saturation is 100%. . The lungs are clear. The heart has a regular rhythm and rate area Impression . . . . . . . The patient is tolerating radiation. Plan . . . . . . . . . . . . Continue treatment as planned.  ________________________________   Blair Promise, PhD, MD

## 2015-07-04 ENCOUNTER — Ambulatory Visit
Admission: RE | Admit: 2015-07-04 | Discharge: 2015-07-04 | Disposition: A | Discharge status: Home / Self Care | Payer: BLUE CROSS/BLUE SHIELD | Source: Ambulatory Visit | Attending: Radiation Oncology | Admitting: Radiation Oncology

## 2015-07-04 DIAGNOSIS — Z510 Encounter for antineoplastic radiation therapy: Secondary | ICD-10-CM | POA: Diagnosis not present

## 2015-07-05 ENCOUNTER — Ambulatory Visit
Admission: RE | Admit: 2015-07-05 | Discharge: 2015-07-05 | Disposition: A | Discharge status: Home / Self Care | Payer: BLUE CROSS/BLUE SHIELD | Source: Ambulatory Visit | Attending: Radiation Oncology | Admitting: Radiation Oncology

## 2015-07-05 DIAGNOSIS — Z510 Encounter for antineoplastic radiation therapy: Secondary | ICD-10-CM | POA: Diagnosis not present

## 2015-07-06 ENCOUNTER — Ambulatory Visit
Admission: RE | Admit: 2015-07-06 | Discharge: 2015-07-06 | Disposition: A | Discharge status: Home / Self Care | Payer: BLUE CROSS/BLUE SHIELD | Source: Ambulatory Visit | Attending: Radiation Oncology | Admitting: Radiation Oncology

## 2015-07-06 DIAGNOSIS — Z510 Encounter for antineoplastic radiation therapy: Secondary | ICD-10-CM | POA: Diagnosis not present

## 2015-07-09 ENCOUNTER — Ambulatory Visit
Admission: RE | Admit: 2015-07-09 | Discharge: 2015-07-09 | Disposition: A | Discharge status: Home / Self Care | Payer: BLUE CROSS/BLUE SHIELD | Source: Ambulatory Visit | Attending: Radiation Oncology | Admitting: Radiation Oncology

## 2015-07-09 ENCOUNTER — Ambulatory Visit: Payer: BLUE CROSS/BLUE SHIELD

## 2015-07-09 DIAGNOSIS — Z510 Encounter for antineoplastic radiation therapy: Secondary | ICD-10-CM | POA: Diagnosis not present

## 2015-07-10 ENCOUNTER — Encounter: Payer: Self-pay | Admitting: Radiation Oncology

## 2015-07-10 ENCOUNTER — Ambulatory Visit
Admission: RE | Admit: 2015-07-10 | Discharge: 2015-07-10 | Disposition: A | Discharge status: Home / Self Care | Payer: BLUE CROSS/BLUE SHIELD | Source: Ambulatory Visit | Attending: Radiation Oncology | Admitting: Radiation Oncology

## 2015-07-10 ENCOUNTER — Ambulatory Visit: Payer: BLUE CROSS/BLUE SHIELD

## 2015-07-10 VITALS — BP 133/76 | HR 67 | Temp 98.0°F | Resp 18 | Ht 68.0 in | Wt 193.5 lb

## 2015-07-10 DIAGNOSIS — Z510 Encounter for antineoplastic radiation therapy: Secondary | ICD-10-CM | POA: Diagnosis not present

## 2015-07-10 DIAGNOSIS — C3412 Malignant neoplasm of upper lobe, left bronchus or lung: Secondary | ICD-10-CM

## 2015-07-10 NOTE — Progress Notes (Signed)
George Montoya has completed 6 fractions to his left chest.  He denies pain, sore throat or trouble swallowing.  He reports his shortness of breath has improved.  He reports having an occasional cough with clear sputum.  He denies fatigue and has been walking 30 minutes on a treadmill 3 times a week.  BP 133/76 mmHg  Pulse 67  Temp(Src) 98 F (36.7 C) (Oral)  Resp 18  Ht '5\' 8"'$  (1.727 m)  Wt 193 lb 8 oz (87.771 kg)  BMI 29.43 kg/m2  SpO2 100%

## 2015-07-10 NOTE — Progress Notes (Signed)
  Radiation Oncology         [phone]) [phone] ________________________________  Name: George Montoya. MRN: [id number]  Date: 07/10/2015  DOB: 1941/02/16  Weekly Radiation Therapy Management    ICD-9-CM ICD-10-CM   1. Primary cancer of left upper lobe of lung (HCC) 162.3 C34.12      Current Dose: 12 Gy     Planned Dose:  66 Gy  Narrative . . . . . . . . The patient presents for routine under treatment assessment.                                   The patient is without complaint.                                 Set-up films were reviewed.                                 The chart was checked. Physical Findings. . .  height is '5\' 8"'$  (1.727 m) and weight is 193 lb 8 oz (87.771 kg). His oral temperature is 98 F (36.7 C). His blood pressure is 133/76 and his pulse is 67. His respiration is 18 and oxygen saturation is 100%. . Weight essentially stable.  No significant changes. The lungs are clear. The heart has a regular rhythm and rate. Impression . . . . . . . The patient is tolerating radiation. Plan . . . . . . . . . . . . Continue treatment as planned.  ________________________________   Blair Promise, PhD, MD

## 2015-07-11 ENCOUNTER — Ambulatory Visit
Admission: RE | Admit: 2015-07-11 | Discharge: 2015-07-11 | Disposition: A | Discharge status: Home / Self Care | Payer: BLUE CROSS/BLUE SHIELD | Source: Ambulatory Visit | Attending: Radiation Oncology | Admitting: Radiation Oncology

## 2015-07-11 DIAGNOSIS — Z510 Encounter for antineoplastic radiation therapy: Secondary | ICD-10-CM | POA: Diagnosis not present

## 2015-07-12 ENCOUNTER — Ambulatory Visit
Admission: RE | Admit: 2015-07-12 | Discharge: 2015-07-12 | Disposition: A | Discharge status: Home / Self Care | Payer: BLUE CROSS/BLUE SHIELD | Source: Ambulatory Visit | Attending: Radiation Oncology | Admitting: Radiation Oncology

## 2015-07-12 DIAGNOSIS — Z510 Encounter for antineoplastic radiation therapy: Secondary | ICD-10-CM | POA: Diagnosis not present

## 2015-07-13 ENCOUNTER — Ambulatory Visit
Admission: RE | Admit: 2015-07-13 | Discharge: 2015-07-13 | Disposition: A | Discharge status: Home / Self Care | Payer: BLUE CROSS/BLUE SHIELD | Source: Ambulatory Visit | Attending: Radiation Oncology | Admitting: Radiation Oncology

## 2015-07-13 DIAGNOSIS — Z510 Encounter for antineoplastic radiation therapy: Secondary | ICD-10-CM | POA: Diagnosis not present

## 2015-07-16 ENCOUNTER — Ambulatory Visit
Admission: RE | Admit: 2015-07-16 | Discharge: 2015-07-16 | Disposition: A | Discharge status: Home / Self Care | Payer: BLUE CROSS/BLUE SHIELD | Source: Ambulatory Visit | Attending: Radiation Oncology | Admitting: Radiation Oncology

## 2015-07-16 DIAGNOSIS — Z510 Encounter for antineoplastic radiation therapy: Secondary | ICD-10-CM | POA: Diagnosis not present

## 2015-07-17 ENCOUNTER — Ambulatory Visit
Admission: RE | Admit: 2015-07-17 | Discharge: 2015-07-17 | Disposition: A | Discharge status: Home / Self Care | Payer: BLUE CROSS/BLUE SHIELD | Source: Ambulatory Visit | Attending: Radiation Oncology | Admitting: Radiation Oncology

## 2015-07-17 ENCOUNTER — Encounter: Payer: Self-pay | Admitting: Radiation Oncology

## 2015-07-17 VITALS — BP 161/89 | HR 62 | Temp 97.7°F | Ht 68.0 in | Wt 194.9 lb

## 2015-07-17 DIAGNOSIS — Z510 Encounter for antineoplastic radiation therapy: Secondary | ICD-10-CM | POA: Diagnosis not present

## 2015-07-17 DIAGNOSIS — C3412 Malignant neoplasm of upper lobe, left bronchus or lung: Secondary | ICD-10-CM

## 2015-07-17 NOTE — Progress Notes (Signed)
  Radiation Oncology         [phone]) [phone] ________________________________  Name: George Montoya. MRN: [id number]  Date: 07/17/2015  DOB: December 25, 1940  Weekly Radiation Therapy Management     ICD-9-CM ICD-10-CM   1. Primary cancer of left upper lobe of lung (HCC) 162.3 C34.12      Current Dose: 22 Gy     Planned Dose:  64-66 Gy  Narrative . . . . . . . . The patient presents for routine under treatment assessment.                                   The patient is without complaint. 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 The patient does have a cream that he places on these areas. This rash only occurs during the winter months and clears up after sun exposure. I've asked patient to bring this medication in for review. Given his skin reaction in the treatment area as well as disease other areas outside the treatment area, he may have more of a skin problem and have asked him to start using this cream.                                 Set-up films were reviewed.                                 The chart was checked. Physical Findings. . .  height is '5\' 8"'$  (1.727 m) and weight is 194 lb 14.4 oz (88.406 kg). His temperature is 97.7 F (36.5 C). His blood pressure is 161/89 and his pulse is 62. His oxygen saturation is 100%. . Weight essentially stable.  No significant changes. The lungs are clear. The heart has a regular rhythm and rash. The patient has a macular papular rash throughout the abdomen and chest region. This rash is more pronounced in his radiation field. No skin breakdown this point. Impression . . . . . . . The patient is tolerating radiation. Plan . . . . . . . . . . . . Continue treatment as planned.  ________________________________   Blair Promise, PhD, MD

## 2015-07-17 NOTE — Progress Notes (Signed)
George Montoya has received 11 fractions to his left chest.  He denies any pain.  Has redness in the upper portion of his back with rash-like appearance, but he denies itching.  Denies SOB.

## 2015-07-18 ENCOUNTER — Ambulatory Visit: Payer: BLUE CROSS/BLUE SHIELD | Admitting: Radiation Oncology

## 2015-07-18 ENCOUNTER — Ambulatory Visit
Admission: RE | Admit: 2015-07-18 | Discharge: 2015-07-18 | Disposition: A | Discharge status: Home / Self Care | Payer: BLUE CROSS/BLUE SHIELD | Source: Ambulatory Visit | Attending: Radiation Oncology | Admitting: Radiation Oncology

## 2015-07-18 ENCOUNTER — Ambulatory Visit
Admission: RE | Admit: 2015-07-18 | Payer: BLUE CROSS/BLUE SHIELD | Source: Ambulatory Visit | Admitting: Radiation Oncology

## 2015-07-18 DIAGNOSIS — Z510 Encounter for antineoplastic radiation therapy: Secondary | ICD-10-CM | POA: Diagnosis not present

## 2015-07-19 ENCOUNTER — Ambulatory Visit
Admission: RE | Admit: 2015-07-19 | Discharge: 2015-07-19 | Disposition: A | Discharge status: Home / Self Care | Payer: BLUE CROSS/BLUE SHIELD | Source: Ambulatory Visit | Attending: Radiation Oncology | Admitting: Radiation Oncology

## 2015-07-19 DIAGNOSIS — Z510 Encounter for antineoplastic radiation therapy: Secondary | ICD-10-CM | POA: Diagnosis not present

## 2015-07-20 ENCOUNTER — Ambulatory Visit
Admission: RE | Admit: 2015-07-20 | Discharge: 2015-07-20 | Disposition: A | Discharge status: Home / Self Care | Payer: BLUE CROSS/BLUE SHIELD | Source: Ambulatory Visit | Attending: Radiation Oncology | Admitting: Radiation Oncology

## 2015-07-20 DIAGNOSIS — Z510 Encounter for antineoplastic radiation therapy: Secondary | ICD-10-CM | POA: Diagnosis not present

## 2015-07-23 ENCOUNTER — Ambulatory Visit
Admission: RE | Admit: 2015-07-23 | Discharge: 2015-07-23 | Disposition: A | Discharge status: Home / Self Care | Payer: BLUE CROSS/BLUE SHIELD | Source: Ambulatory Visit | Attending: Radiation Oncology | Admitting: Radiation Oncology

## 2015-07-23 DIAGNOSIS — Z510 Encounter for antineoplastic radiation therapy: Secondary | ICD-10-CM | POA: Diagnosis not present

## 2015-07-24 ENCOUNTER — Encounter: Payer: Self-pay | Admitting: Radiation Oncology

## 2015-07-24 ENCOUNTER — Ambulatory Visit
Admission: RE | Admit: 2015-07-24 | Discharge: 2015-07-24 | Disposition: A | Discharge status: Home / Self Care | Payer: BLUE CROSS/BLUE SHIELD | Source: Ambulatory Visit | Attending: Radiation Oncology | Admitting: Radiation Oncology

## 2015-07-24 VITALS — BP 140/74 | HR 69 | Temp 97.9°F | Resp 20 | Wt 195.4 lb

## 2015-07-24 DIAGNOSIS — C3412 Malignant neoplasm of upper lobe, left bronchus or lung: Secondary | ICD-10-CM | POA: Diagnosis not present

## 2015-07-24 DIAGNOSIS — Z510 Encounter for antineoplastic radiation therapy: Secondary | ICD-10-CM | POA: Diagnosis not present

## 2015-07-24 MED ORDER — SONAFINE EX EMUL
1.0000 "application " | Freq: Two times a day (BID) | CUTANEOUS | Status: DC
  Administered 2015-07-24: 1 via TOPICAL
  Filled 2015-07-24: qty 45

## 2015-07-24 NOTE — Progress Notes (Signed)
  Radiation Oncology         [phone]) [phone] ________________________________  Name: George Montoya. MRN: [id number]  Date: 07/24/2015  DOB: 12-17-1940  Weekly Radiation Therapy Management    ICD-9-CM ICD-10-CM   1. Primary cancer of left upper lobe of lung (HCC) 162.3 C34.12 SONAFINE emulsion 1 application     Current Dose: 32 Gy     Planned Dose:  64-66 Gy  Narrative . . . . . . . . The patient presents for routine under treatment assessment.                                   The patient is without complaint except for a rash along the treatment area and some itching. Patient denies any fatigue, coughing or swallowing difficulties                                 Set-up films were reviewed.                                 The chart was checked. Physical Findings. . .  weight is 195 lb 6.4 oz (88.633 kg). His oral temperature is 97.9 F (36.6 C). His blood pressure is 140/74 and his pulse is 69. His respiration is 20 and oxygen saturation is 100%. . Patient has brisk reaction along the left upper back region. He also has some erythema and radiation dermatitis anteriorly. No skin breakdown at this point.  His rash outside the treatment area is less significant since he started using his triamcinolone/Cetaphil cream again. Impression . . . . . . . The patient is tolerating radiation. Patient is radiation reaction appears to be more significant than expected. This may be related to the patient's history of groves syndrome. He does use an ointment which is triamcinolone cream and Cetaphil given to him by his dermatologist for his other areas outside the radiation field. I have recommended he uses once a day in the treatment area along with his sonafine. Plan . . . . . . . . . . . . Continue treatment as planned.  ________________________________   Blair Promise, PhD, MD

## 2015-07-24 NOTE — Progress Notes (Signed)
Weekly rad txs left chest 16/33 completed, no c/o pain, no coughing, c.o sunburn effect on front and back of left chest, rashy looking,erythema, c/o itching, gave sonafine cream to start using instead of radiaplex, no difficulty swallowing solids or liquids appetite good 8:26 AM BP 140/74 mmHg  Pulse 69  Temp(Src) 97.9 F (36.6 C) (Oral)  Resp 20  Wt 195 lb 6.4 oz (88.633 kg)  SpO2 100%  Wt Readings from Last 3 Encounters:  07/24/15 195 lb 6.4 oz (88.633 kg)  07/17/15 194 lb 14.4 oz (88.406 kg)  07/10/15 193 lb 8 oz (87.771 kg)

## 2015-07-25 ENCOUNTER — Ambulatory Visit
Admission: RE | Admit: 2015-07-25 | Discharge: 2015-07-25 | Disposition: A | Discharge status: Home / Self Care | Payer: BLUE CROSS/BLUE SHIELD | Source: Ambulatory Visit | Attending: Radiation Oncology | Admitting: Radiation Oncology

## 2015-07-25 DIAGNOSIS — Z510 Encounter for antineoplastic radiation therapy: Secondary | ICD-10-CM | POA: Diagnosis not present

## 2015-07-26 ENCOUNTER — Ambulatory Visit
Admission: RE | Admit: 2015-07-26 | Discharge: 2015-07-26 | Disposition: A | Discharge status: Home / Self Care | Payer: BLUE CROSS/BLUE SHIELD | Source: Ambulatory Visit | Attending: Radiation Oncology | Admitting: Radiation Oncology

## 2015-07-26 DIAGNOSIS — Z510 Encounter for antineoplastic radiation therapy: Secondary | ICD-10-CM | POA: Diagnosis not present

## 2015-07-27 ENCOUNTER — Ambulatory Visit
Admission: RE | Admit: 2015-07-27 | Discharge: 2015-07-27 | Disposition: A | Discharge status: Home / Self Care | Payer: BLUE CROSS/BLUE SHIELD | Source: Ambulatory Visit | Attending: Radiation Oncology | Admitting: Radiation Oncology

## 2015-07-27 DIAGNOSIS — Z510 Encounter for antineoplastic radiation therapy: Secondary | ICD-10-CM | POA: Diagnosis not present

## 2015-07-30 ENCOUNTER — Ambulatory Visit
Admission: RE | Admit: 2015-07-30 | Discharge: 2015-07-30 | Disposition: A | Discharge status: Home / Self Care | Payer: BLUE CROSS/BLUE SHIELD | Source: Ambulatory Visit | Attending: Radiation Oncology | Admitting: Radiation Oncology

## 2015-07-30 DIAGNOSIS — Z510 Encounter for antineoplastic radiation therapy: Secondary | ICD-10-CM | POA: Diagnosis not present

## 2015-07-31 ENCOUNTER — Encounter: Payer: Self-pay | Admitting: Radiation Oncology

## 2015-07-31 ENCOUNTER — Ambulatory Visit
Admission: RE | Admit: 2015-07-31 | Discharge: 2015-07-31 | Disposition: A | Discharge status: Home / Self Care | Payer: BLUE CROSS/BLUE SHIELD | Source: Ambulatory Visit | Attending: Radiation Oncology | Admitting: Radiation Oncology

## 2015-07-31 VITALS — BP 134/79 | HR 74 | Temp 98.3°F | Ht 68.0 in | Wt 194.5 lb

## 2015-07-31 DIAGNOSIS — C3412 Malignant neoplasm of upper lobe, left bronchus or lung: Secondary | ICD-10-CM

## 2015-07-31 DIAGNOSIS — Z510 Encounter for antineoplastic radiation therapy: Secondary | ICD-10-CM | POA: Diagnosis not present

## 2015-07-31 NOTE — Progress Notes (Signed)
George Montoya is here for under treat he has completed 21 fractions, denies any shortness or breathe or sore throat, states the occasional dry cough, skin on left upper chest and upper back is red with scattered scabbed areas, pt is using radiaplex and steroid cream, he denies any pain and fatigue at this time  BP 134/79 mmHg  Pulse 74  Temp(Src) 98.3 F (36.8 C) (Oral)  Ht '5\' 8"'$  (1.727 m)  Wt 194 lb 8 oz (88.225 kg)  BMI 29.58 kg/m2  SpO2 99%

## 2015-07-31 NOTE — Progress Notes (Signed)
  Radiation Oncology         [phone]) [phone] ________________________________  Name: George Montoya. MRN: [id number]  Date: 07/31/2015  DOB: 07-10-1940  Simulation  Note    ICD-9-CM ICD-10-CM   1. Primary cancer of left upper lobe of lung (HCC) 162.3 C34.12     Status: outpatient  NARRATIVE: Earlier today the patient was noted on exam to have a significant skin reaction in his right upper back. In Light of this issue the patient underwent replanning this afternoon and will be treated with 5 field plan that will significantly reduce the dose to the skin along the anterior and posterior chest region. This field will start tomorrow. Today I discussed this issue with the patient who understands and is in agreement. A computerized last dose plan was generated for this treatment. Patient will receive 24 gray in 12 fractions with this beam modification. -----------------------------------  Blair Promise, PhD, MD

## 2015-07-31 NOTE — Addendum Note (Signed)
Encounter addended by: Gery Pray, MD on: 07/31/2015  3:23 PM<BR>     Documentation filed: Notes Section

## 2015-07-31 NOTE — Progress Notes (Signed)
  Radiation Oncology         [phone]) [phone] ________________________________  Name: George Montoya. MRN: [id number]  Date: 07/31/2015  DOB: [birth year]  Weekly Radiation Therapy Management    ICD-9-CM ICD-10-CM   1. Primary cancer of left upper lobe of lung (HCC) 162.3 C34.12      Current Dose: 42 Gy     Planned Dose:  66 Gy  Narrative . . . . . . . . The patient presents for routine under treatment assessment.                                   The patient is without complaint. He occasionally notices some mild itching in the left upper back. No breathing problems or fatigue. He continues to work his usual schedule. No swallowing difficulties                                 Set-up films were reviewed.                                 The chart was checked. Physical Findings. . .  height is '5\' 8"'$  (1.727 m) and weight is 194 lb 8 oz (88.225 kg). His oral temperature is 98.3 F (36.8 C). His blood pressure is 134/79 and his pulse is 74. His oxygen saturation is 99%. . Weight essentially stable.  The lungs are clear. The heart has a regular rhythm and rate. Examination of the skin reveals erythema along the anterior radiation portal. Posteriorly the patient has brisk reaction with radiation dermatitis but no moist desquamation. Impression . . . . . . . The patient is tolerating radiation. Plan . . . . . . . . . . . . Continue treatment as planned. Patient will continue using radiaplex provided by [place] as well as his steroid cream.  ________________________________   Blair Promise, PhD, MD

## 2015-08-01 ENCOUNTER — Ambulatory Visit
Admission: RE | Admit: 2015-08-01 | Discharge: 2015-08-01 | Disposition: A | Discharge status: Home / Self Care | Payer: BLUE CROSS/BLUE SHIELD | Source: Ambulatory Visit | Attending: Radiation Oncology | Admitting: Radiation Oncology

## 2015-08-01 DIAGNOSIS — Z510 Encounter for antineoplastic radiation therapy: Secondary | ICD-10-CM | POA: Diagnosis not present

## 2015-08-02 ENCOUNTER — Ambulatory Visit
Admission: RE | Admit: 2015-08-02 | Discharge: 2015-08-02 | Disposition: A | Discharge status: Home / Self Care | Payer: BLUE CROSS/BLUE SHIELD | Source: Ambulatory Visit | Attending: Radiation Oncology | Admitting: Radiation Oncology

## 2015-08-02 DIAGNOSIS — Z510 Encounter for antineoplastic radiation therapy: Secondary | ICD-10-CM | POA: Diagnosis not present

## 2015-08-03 ENCOUNTER — Ambulatory Visit
Admission: RE | Admit: 2015-08-03 | Discharge: 2015-08-03 | Disposition: A | Discharge status: Home / Self Care | Payer: BLUE CROSS/BLUE SHIELD | Source: Ambulatory Visit | Attending: Radiation Oncology | Admitting: Radiation Oncology

## 2015-08-03 DIAGNOSIS — Z510 Encounter for antineoplastic radiation therapy: Secondary | ICD-10-CM | POA: Diagnosis not present

## 2015-08-06 ENCOUNTER — Ambulatory Visit
Admission: RE | Admit: 2015-08-06 | Discharge: 2015-08-06 | Disposition: A | Discharge status: Home / Self Care | Payer: BLUE CROSS/BLUE SHIELD | Source: Ambulatory Visit | Attending: Radiation Oncology | Admitting: Radiation Oncology

## 2015-08-06 DIAGNOSIS — Z510 Encounter for antineoplastic radiation therapy: Secondary | ICD-10-CM | POA: Diagnosis not present

## 2015-08-07 ENCOUNTER — Encounter: Payer: Self-pay | Admitting: Radiation Oncology

## 2015-08-07 ENCOUNTER — Ambulatory Visit
Admission: RE | Admit: 2015-08-07 | Discharge: 2015-08-07 | Disposition: A | Discharge status: Home / Self Care | Payer: BLUE CROSS/BLUE SHIELD | Source: Ambulatory Visit | Attending: Radiation Oncology | Admitting: Radiation Oncology

## 2015-08-07 VITALS — BP 130/91 | HR 67 | Temp 98.3°F | Resp 16 | Ht 68.0 in | Wt 193.7 lb

## 2015-08-07 DIAGNOSIS — Z510 Encounter for antineoplastic radiation therapy: Secondary | ICD-10-CM | POA: Diagnosis not present

## 2015-08-07 DIAGNOSIS — C3412 Malignant neoplasm of upper lobe, left bronchus or lung: Secondary | ICD-10-CM

## 2015-08-07 NOTE — Progress Notes (Signed)
  Radiation Oncology         [phone]) [phone] ________________________________  Name: George Montoya. MRN: [id number]  Date: 08/07/2015  DOB: 09/22/1940  Weekly Radiation Therapy Management    ICD-9-CM ICD-10-CM   1. Primary cancer of left upper lobe of lung (HCC) 162.3 C34.12      Current Dose: 52 Gy     Planned Dose:  66 Gy  Narrative . . . . . . . . The patient presents for routine under treatment assessment.                                   The patient is having some pain in the left upper back where his Skin reaction is located when applying radiaPlex. Skin very sensitive in this area. He is also noticed some moistness to the area and will occasionally stick to his shirt. Last week the patient's radiation fields were changed to limit dose to the skin in the left upper back and anterior chest region. Patient has an occasional mild cough, no hemoptysis or chest pain or breathing problems.                                 Set-up films were reviewed.                                 The chart was checked. Physical Findings. . .  height is '5\' 8"'$  (1.727 m) and weight is 193 lb 11.2 oz (87.862 kg). His oral temperature is 98.3 F (36.8 C). His blood pressure is 130/91 and his pulse is 67. His respiration is 16 and oxygen saturation is 100%. . The lungs are clear. The heart has a regular rhythm and rate.  The skin of the left upper back area shows dry desquamation erythema and minimal moist desquamation. No signs of infection. Overall the erythema seems less compared to last week. Impression . . . . . . . The patient is tolerating radiation. Plan . . . . . . . . . . . . Continue treatment as planned. I have recommended that the patient obtain some Neosporin plus to place along the skin reaction in the left upper back and anterior chest region.  ________________________________   Blair Promise, PhD, MD

## 2015-08-07 NOTE — Progress Notes (Signed)
George Montoya has completed 26 fractions to his left chest.  He reports pain off and on almost like "a nerve pain" in his upper left back.  He denies trouble swallowing, sore throat and shortness of breath.  He reports he has a dry cough.  He denies having fatigue.  The skin on his left upper chest is red with a rash like appearance.  The skin on his left upper back is red with dry desquamation.  He reports it is draining yellow fluid and bleeds occasionally.  He is using radiaplex and his steroid cream twice a day.  He reports that the radiaplex hurts for about 5 minutes after he applies it.  Recommended he try sonafine and neosporin to the area.  BP 130/91 mmHg  Pulse 67  Temp(Src) 98.3 F (36.8 C) (Oral)  Resp 16  Ht '5\' 8"'$  (1.727 m)  Wt 193 lb 11.2 oz (87.862 kg)  BMI 29.46 kg/m2  SpO2 100%   Wt Readings from Last 3 Encounters:  08/07/15 193 lb 11.2 oz (87.862 kg)  07/31/15 194 lb 8 oz (88.225 kg)  07/24/15 195 lb 6.4 oz (88.633 kg)

## 2015-08-08 ENCOUNTER — Ambulatory Visit
Admission: RE | Admit: 2015-08-08 | Discharge: 2015-08-08 | Disposition: A | Discharge status: Home / Self Care | Payer: BLUE CROSS/BLUE SHIELD | Source: Ambulatory Visit | Attending: Radiation Oncology | Admitting: Radiation Oncology

## 2015-08-08 DIAGNOSIS — Z510 Encounter for antineoplastic radiation therapy: Secondary | ICD-10-CM | POA: Diagnosis not present

## 2015-08-09 ENCOUNTER — Ambulatory Visit
Admission: RE | Admit: 2015-08-09 | Discharge: 2015-08-09 | Disposition: A | Discharge status: Home / Self Care | Payer: BLUE CROSS/BLUE SHIELD | Source: Ambulatory Visit | Attending: Radiation Oncology | Admitting: Radiation Oncology

## 2015-08-09 DIAGNOSIS — Z510 Encounter for antineoplastic radiation therapy: Secondary | ICD-10-CM | POA: Diagnosis not present

## 2015-08-10 ENCOUNTER — Ambulatory Visit
Admission: RE | Admit: 2015-08-10 | Discharge: 2015-08-10 | Disposition: A | Discharge status: Home / Self Care | Payer: BLUE CROSS/BLUE SHIELD | Source: Ambulatory Visit | Attending: Radiation Oncology | Admitting: Radiation Oncology

## 2015-08-10 DIAGNOSIS — Z510 Encounter for antineoplastic radiation therapy: Secondary | ICD-10-CM | POA: Diagnosis not present

## 2015-08-13 ENCOUNTER — Ambulatory Visit
Admission: RE | Admit: 2015-08-13 | Discharge: 2015-08-13 | Disposition: A | Discharge status: Home / Self Care | Payer: BLUE CROSS/BLUE SHIELD | Source: Ambulatory Visit | Attending: Radiation Oncology | Admitting: Radiation Oncology

## 2015-08-13 DIAGNOSIS — Z510 Encounter for antineoplastic radiation therapy: Secondary | ICD-10-CM | POA: Diagnosis not present

## 2015-08-14 ENCOUNTER — Ambulatory Visit
Admission: RE | Admit: 2015-08-14 | Discharge: 2015-08-14 | Disposition: A | Discharge status: Home / Self Care | Payer: BLUE CROSS/BLUE SHIELD | Source: Ambulatory Visit | Attending: Radiation Oncology | Admitting: Radiation Oncology

## 2015-08-14 ENCOUNTER — Encounter: Payer: Self-pay | Admitting: Radiation Oncology

## 2015-08-14 VITALS — BP 143/74 | HR 65 | Temp 98.4°F | Resp 16 | Ht 68.0 in | Wt 193.8 lb

## 2015-08-14 DIAGNOSIS — Z510 Encounter for antineoplastic radiation therapy: Secondary | ICD-10-CM | POA: Diagnosis not present

## 2015-08-14 DIAGNOSIS — C3412 Malignant neoplasm of upper lobe, left bronchus or lung: Secondary | ICD-10-CM

## 2015-08-14 NOTE — Progress Notes (Signed)
George Montoya has completed 31 fractions to his left chest.  He denies having pain, shortness of breath, trouble swallowing or sore throat.  He also denies having a cough and fatigue.  The skin on his left upper back is red and healing.  The center of his back is red with a rash like appearance as well as his left chest.  He is applying sonafine and neosporin plus pain 3 times a day.  He has been given a one month follow up card.  BP 143/74 mmHg  Pulse 65  Temp(Src) 98.4 F (36.9 C) (Oral)  Resp 16  Ht '5\' 8"'$  (1.727 m)  Wt 193 lb 12.8 oz (87.907 kg)  BMI 29.47 kg/m2  SpO2 100%   Wt Readings from Last 3 Encounters:  08/14/15 193 lb 12.8 oz (87.907 kg)  08/07/15 193 lb 11.2 oz (87.862 kg)  07/31/15 194 lb 8 oz (88.225 kg)

## 2015-08-14 NOTE — Progress Notes (Signed)
  Radiation Oncology         [phone]) [phone] ________________________________  Name: George Montoya. MRN: [id number]  Date: 08/14/2015  DOB: 06/14/1941  Weekly Radiation Therapy Management    ICD-9-CM ICD-10-CM   1. Primary cancer of left upper lobe of lung (HCC) 162.3 C34.12     Current Dose: 62 Gy     Planned Dose:  66 Gy  Narrative . . . . . . . . The patient presents for routine under treatment assessment. The patient has completed 31 fractions to his left chest. He denies pain, shortness of breath, trouble swallowing, or a sore throat. He also denies having a cough and fatigue. He is applying sonafine and neosporin plus pain 3 times a day to his back.He reports pruritus in the front, middle, chest, but denies itching on his back. The patient denies fatigue.                                 Set-up films were reviewed.                                 The chart was checked. Physical Findings. . .  height is '5\' 8"'$  (1.727 m) and weight is 193 lb 12.8 oz (87.907 kg). His oral temperature is 98.4 F (36.9 C). His blood pressure is 143/74 and his pulse is 65. His respiration is 16 and oxygen saturation is 100%.  The lungs are clear. The heart has a regular rhythm and rate.  The skin of the left upper back area shows dry desquamation and less erythema than last week. Reaction overall much better, no moist desquamation. Impression . . . . . . . The patient is tolerating radiation. Plan . . . . . . . . . . . . Continue treatment as planned.  He was given a one month follow up card by the nurse. ________________________________   Blair Promise, PhD, MD  This document serves as a record of services personally performed by Gery Pray, MD. It was created on his behalf by Darcus Austin, a trained medical scribe. The creation of this record is based on the scribe's personal observations and the provider's statements to them. This document has been checked and approved by the attending  provider.

## 2015-08-15 ENCOUNTER — Ambulatory Visit
Admission: RE | Admit: 2015-08-15 | Discharge: 2015-08-15 | Disposition: A | Discharge status: Home / Self Care | Payer: BLUE CROSS/BLUE SHIELD | Source: Ambulatory Visit | Attending: Radiation Oncology | Admitting: Radiation Oncology

## 2015-08-15 DIAGNOSIS — Z510 Encounter for antineoplastic radiation therapy: Secondary | ICD-10-CM | POA: Diagnosis not present

## 2015-08-16 ENCOUNTER — Encounter: Payer: Self-pay | Admitting: Radiation Oncology

## 2015-08-16 ENCOUNTER — Ambulatory Visit
Admission: RE | Admit: 2015-08-16 | Discharge: 2015-08-16 | Disposition: A | Discharge status: Home / Self Care | Payer: BLUE CROSS/BLUE SHIELD | Source: Ambulatory Visit | Attending: Radiation Oncology | Admitting: Radiation Oncology

## 2015-08-16 DIAGNOSIS — Z510 Encounter for antineoplastic radiation therapy: Secondary | ICD-10-CM | POA: Diagnosis not present

## 2015-08-20 NOTE — Progress Notes (Signed)
  Radiation Oncology         [phone]) [phone] ________________________________  Name: George Montoya. MRN: [id number]  Date: 08/16/2015  DOB: 1940/12/26  End of Treatment Note   ICD-9-CM ICD-10-CM    1. Primary cancer of left upper lobe of lung (HCC) 162.3 C34.12     DIAGNOSIS: Stage 1b Well Differentiated Adenocarcinoma of the Left Lung with positive surgical margins     Indication for treatment:  Positive surgical margins       Radiation treatment dates:   07/03/2015-08/16/2015  Site/dose:   Left mid chest, surgical clips, 66 gray in 33 fractions  Beams/energy:   3-D conformal with multiple beams; patient was noted to have significant skin reaction in this left upper back and in light of this towards the latter portion of his treatment he had change in his radiation fields to limit dose to this area of back  Narrative: The patient tolerated radiation treatment relatively well.   Minimal fatigue. The patient did develop a significant skin reaction in the left upper back which may have been related to his prior rash Gwinda Passe syndrome) After alteration in the patient's radiation fields his skin reaction improved during the latter portion of his treatment. Patient's skin reaction may have also improved with application of his Cetaphil and triamcinolone cream given to him by his dermatologist.  Plan: The patient has completed radiation treatment. The patient will return to radiation oncology clinic for routine followup in one month. I advised them to call or return sooner if they have any questions or concerns related to their recovery or treatment.  -----------------------------------  Blair Promise, PhD, MD

## 2015-09-13 ENCOUNTER — Other Ambulatory Visit: Payer: Self-pay | Admitting: Adult Health

## 2015-09-13 ENCOUNTER — Telehealth: Payer: Self-pay | Admitting: Adult Health

## 2015-09-13 ENCOUNTER — Other Ambulatory Visit: Payer: Self-pay | Admitting: *Deleted

## 2015-09-13 DIAGNOSIS — C3412 Malignant neoplasm of upper lobe, left bronchus or lung: Secondary | ICD-10-CM

## 2015-09-13 DIAGNOSIS — I712 Thoracic aortic aneurysm, without rupture, unspecified: Secondary | ICD-10-CM

## 2015-09-13 NOTE — Telephone Encounter (Signed)
I spoke with Mr. George Montoya briefly and congratulated him on recently completing his radiation therapy for lung cancer.  He tells me that he is doing very well; he has had no fatigue and the skin on his back is healed.  I spoke to him briefly about survivorship and my role in his care.  He agreed to meet with me for a Survivorship Care Plan visit after his routine f/u with Dr. Sondra Come on 09/20/15.  I have made this appt today and the patient is aware of it.   I will forward this note to Elmo Putt, RN to make her aware as well. I look forward to participating in his care!   Mike Craze, NP [place] [phone]

## 2015-09-19 ENCOUNTER — Encounter: Payer: Self-pay | Admitting: Oncology

## 2015-09-19 ENCOUNTER — Encounter: Payer: Self-pay | Admitting: Adult Health

## 2015-09-19 NOTE — Progress Notes (Signed)
Lung Cancer Treatment Summary & Survivorship Care Plan Provided by Mike Craze, NP on 09/20/2015   General Information  Patient Name George Montoya. George Montoya.   Patient ID [id number]   Date of Birth [birth year]     Your Care Team  Patient Care Team: Leanna Battles, MD as PCP - General (Internal Medicine)  Gilford Raid, MD - Cardiothoracic Surgeon Gery Pray, MD - Radiation Oncologist Mike Craze, NP - Survivorship Nurse Practitioner   To reach your [facility] providers, call [phone].   Cancer Diagnosis Information  Diagnosis Non-Small Cell Lung Cancer  Tumor Location Left upper lobe  Diagnosis Date 05/21/15  Pathology Adenocarcinoma  Staging  Primary cancer of left upper lobe of lung ([place])   Staging form: Lung, AJCC 7th Edition     Pathologic: Stage IA (T1b, N0, cM0)    Family History Father-prostate cancer Mother-lung cancer  Smoking Status Former smoker    Treatment Summary  Oncology History   08/2014: Patient presented with back pain; lung nodule found incidentally on CT in work-up of back pain; subsequent PET scan showed mild hypermetabolic activity in LUL lung.     Primary cancer of left upper lobe of lung ([place])   09/14/2014 PET scan Solitary LUL pulmonary nodule measuring approx 17 x 15 mm is weakly hypermetabolic, SUV max 1.9. No mediastinal or hilar adenopathy. No metastatic disease. Aneurysmal dilatation of ascending thoracic aorta-recommend semi-annual follow-up with CTA/MRA.    10/16/2014 Imaging CT chest (for bronch planning): LUL with sub-solid lesion measuring 2.1 x 1.5 cm, slightly enlarged from PET-CT. No other pulm nodules identified. No endobronchial lesion. No evidence of metastatic disease.    10/18/2014 Procedure Bronch with LUL biopsy. Path revealed focal atypia with fibrosis; findings not diagnostic of malignancy.    01/24/2015 Imaging CT chest for routine f/u of lung nodule: Similar size part solid nodule measuring 2.2 cm with  internal solid component measuring 1.7 cm. Remains concerning for pulmonary adenocarcinoma and correlation with biopsy and/or surgical resection recommended.    05/01/2015 Imaging CT chest: Interval growth of mixed subsolid & solid nodule in LUL, now measuring 3.1 x 2.3 cm, concerning for primary bronchogenic adenocarcinoma.    05/21/2015 Surgery LUL lobectomy, left muscle-sparing thoracotomy ([place]). Path revealed well differentiated adenocarcinoma, spanning 2.6 cm. (+) margin at stapled parenchymal resection margin. Level 11 LN x 1 neg. Level 12 LN x 1 neg.    05/21/2015 Pathologic Stage pT1b, pN0: Stage IA   05/21/2015 Initial Diagnosis Primary cancer of left upper lobe of lung ([place])   07/03/2015 - 08/16/2015 Radiation Therapy External beam radiation ([place]). Left mid chest, surgical clips: Total dose 66 gray in 33 fractions.      Treatment Goal Curative             Your cancer treatment included surgery and radiation therapy.        Surgery: Type of Surgery Left muscle-sparing thoracotomy and Left upper lobectomy   Date of Surgery 05/21/15  Number of Lymph Nodes Removed 2  Number of Positive Lymph Nodes 0  Positive Surgical Margins?   Yes; this is why you went on to receive radiation therapy.    Side Effects of Surgery  Possible Long-Term:  Scars  Chronic pain  Possible Late-Term (5 or more years after treatment): Marland Kitchen Lymphedema or Swelling of the arms, neck, and/or chest region  Radiation Therapy: Type of Radiation External Beam Radiation Therapy (EBRT)   Location of Radiation Left mid chest and surgical clips  Radiation Period Start Date: 07/03/15                 End Date: 08/16/15  Number of Radiation Treatments 33 treatments  Total Radiation Dose 66 Gy    Side Effects of Radiation  Possible Long-Term Fatigue Skin irritation/discoloration Hair loss in treated areas  Possible Late-Term (five  or more years after treatment)  Skin changes (including discoloration to the treated area) Lung damage/irritation Formation of scar tissue Rib damage (rare) Heart irritation or damage (for left-sided tumors) Damage to any normal tissues in the irradiated field Secondary cancers (rare) New primary cancers (rare)   Whether you experience late side effects will depend on: The part of your body that was treated The dose and length of your radiation therapy And if you received chemotherapy before, during, or after radiation therapy .                    Survivorship Care & Follow-up Schedule   Becoming a cancer survivor is a time of great celebration, but also a time of many questions and concerns.  The following information and resources are for you, your caregivers, and your primary care doctor to better understand the needs of a cancer survivor.     How often will I see my cancer providers: Survivor Years 0-2  (Survivor Years defined by length of time since cancer diagnosis)  When?  Responsible Provider  Cardiothoracic Surgeon visits Every 6 months Dr. Cyndia Bent  Radiation Oncology visits Every 6 months Dr. Sondra Come  CT scan of chest Every 6 months Dr. Sondra Come or Dr. Cyndia Bent   Survivorship Nurse Practitioner (NP) 57-monthafter treatment completed GMike Craze NP      How often will I see my cancer providers: Survivor Years 3-5+ (Survivor Years defined by length of time since cancer diagnosis)  When?  Responsible Provider  Radiation Oncology visits Annually* Dr. KSondra Come Cardiothoracic Surgeon visits Annually* Dr. BCyndia Bent CT scan of chest Annually Dr. KSondra Comeor GMike Craze NP  Survivorship Nurse Practitioner (NP) Annually when your doctor refers you to survivorship as a long-term survivor! GMike Craze NP   *You will either see Dr. KSondra Comeor Dr. BCyndia Bentat least once every year until they deem you appropriate for long-term follow-up in our Survivorship Clinic!                      Screening Recommendations Get regular screenings, as indicated by your Primary Care Provider   Frequency Responsible Provider  Annual Physical By your Primary Care Provider Should include skin examination Annually Discuss with Dr. DLeanna Battles Colonoscopy Beginning at age 7440unless clinically indicated to begin sooner Every 10 Years Discuss with Dr. DLeanna Battles Prostate Exam (Men) Beginning at age 75 Talk with your Primary Care Provider about the pros and cons of testing so you can decide if testing is the right choice for you Discuss with Dr. DLeanna BattlesDiscuss with Dr. DLeanna Battles Vaccinations (Flu, Shingles, Pneumonia, TDaP, etc.) Discuss with Dr. DLeanna BattlesDiscuss with Dr. DLeanna Battles     Common Complaints/Concerns of Cancer Survivors  *Patients often worry if what they are experiencing is normal, or to be expected, given their cancer history and treatment.  Below are common complaints & concerns reported by cancer survivors. If you are concerned about  symptoms you are experiencing, please report all symptoms to your cancer care team!   Common Complaints/Concerns for Lung Cancer Survivors   Fatigue  Dry cough  Memory problems and/or confusion  Depression  Anxiety  Weight changes  Insomnia or trouble sleeping  Decreased range of motion (difficulty moving neck)  Intimacy and sexuality  Marital/partner/family relationships   Employment, health insurance concerns, and/or finances  Concerns regarding spirituality  Exercise after cancer treatments  Maintaining a tobacco-free lifestyle      Symptoms to Watch For and Report to Your Provider   *Often cancer survivors are fearful about their cancer coming back or being diagnosed with a new cancer.  Below are symptoms that you and your loved ones should watch for and report to your provider.   General Symptoms to Watch for and Report to Your  Provider .  Return of the cancer symptoms you had before- such as a lump or new growth where your cancer first started . New or unusual pain that seems unrelated to an injury and does not go away, including back pain or bone pain . Weight loss without trying/intending . Unexplained bleeding . A rash or allergic reaction, such as swelling, severe itching or wheezing . Chills or fevers . Persistent headaches . Shortness of breath or difficulty breathing . Bloody stools or blood in your urine . Lumps, bumps, swelling and/or nipple discharge . Nausea, vomiting, diarrhea, loss of appetite, or trouble swallowing . A cough that doesn't go away . Abdominal pain . Swelling in your arms or legs . Fractures . Any other signs mentioned by your doctor or nurse or any unusual symptoms                 that you just can't explain   NOTE: Just because you have certain symptoms, it doesn't mean the cancer has come back or you have a new cancer. Symptoms can be due to other problems that need to be addressed.  It is important to watch for these symptoms and report them to your provider so you can be medically evaluated for any of these concerns!                     What Now?  Thriving & Surviving In Your Life After Cancer   Stop smoking or continue to abstain from tobacco use. If you need help with smoking cessation, talk to your healthcare team about different options to help you!  Consider participating in "Finding Your New Normal" [facility]) survivorship series or other support groups through our Patient & [facility].   [employer] fitness program for cancer survivors.   Consider FREE counseling at cancer center.  For more information, contact Ottis Stain at [phone].  Keep your follow-up appointments with all of your specialists (Cancer doctors, Pulmonary doctors, Primary Care Provider, Dietician, Physical Therapist, etc.)  Limit alcohol consumption or abstain  from consuming alcohol all together.   Maintain adequate nutrition with plenty of fresh fruits & vegetables.       Thank you so much for allowing [facility] to care for you during your cancer experience.  Our continued commitment is to you and your caregiver(s) health and happiness and again, Congratulations!     Mike Craze, NP Survivorship Program [facility] [phone]                 ~For additional information, see attached resources.  Additional Resources for Lung Cancer Survivors  Survivors of cancer often report some of the following needs or concerns after they have completed treatment.  Below are some suggested interventions and resources to help guide you.  Just because your cancer treatment has ended, it does not mean that we stop helping you manage your needs or concerns.  Please let us know how we can best help you in your new life post-cancer and your return to health and wellness!       Common Needs/Concerns Suggested intervention(s)  Fatigue . This is the most common symptom experienced by lung cancer survivors. You are not alone!  . Regular physical activity - walking 20 minutes daily . Evaluation for hypothyroidism, anemia, sleep disturbance, or depression . For more information, visit the National Cancer Institute's (NCI) website: http://www.kaiser.com/   Shortness of breath . Very common after lung cancer surgery due to reduced lung volume, as well as decreased physical activity. . Referral to Physical Therapy for improved endurance and stamina.  [employer] booklet   Chronic cough or changes in your voice . The cough could be related to other airway diseases, like asthma or COPD, and those conditions should be treated to help with the cough.  . Could be related to acid reflux from the stomach.  You may need a medication to help with the  acid which will help the cough.  . Over-the-counter cough suppressants can offer some relief.   . Sometimes, a short course of oral steroids can help suppress a cough.  Ask your doctor which treatment option may be best for you.   Chronic pain . About 50% of lung cancer survivors will experience some type of chronic pain, particularly after thoracotomy surgery.  . Communicate honestly with your doctors and providers about your pain so that we may help make the best recommendations for you.   Smoking cessation and/or maintaining your tobacco-free lifestyle . Referral for Smoking Cessation Counseling . Support Groups and Counseling . Medications to help with nicotine withdrawal symptoms. . Consider acupuncture or hypnosis.  . For more information and valuable resources, visit: smokefree.gov   Memory problems and/or confusion . About 25% of cancer patients have some degree of cognitive dysfunction (meaning memory problems, trouble concentrating, trouble finding the right word, etc.) after treatment.  This usually gets better over time.  . Some patients may need evaluation for sleep disturbances contributing to memory problems or depression.  . For more information, visit NCI's website at: ForwardDrop.tn   Change in hearing . Certain chemotherapy drugs can cause hearing loss.  Most patients improve with time.  Marland Kitchen Referral to Audiologist for hearing evaluation and recommendations.   Peripheral neuropathy . Some chemotherapy drugs can cause numbness/tingling in the hands and feet.  This usually improves with time and when chemotherapy is completed.  However, some patients may experience symptoms long after their chemotherapy has finished.  . Gabapentin, a prescription medication, may help.  . Certain antidepressant drugs have been shown to help with neuropathy pain.   . Over-the-counter capsaicin cream may help temporarily decrease the sensation of pain.    . Very rarely are strong pain medications needed for treatment of neuropathy.  . Visit NCI's website for more information: http://www.taylor.net/   Depression/General Anxiety  Consider counseling and/or initiation of pharmacotherapy  Referral to Social Worker   Referral to [place]. Call [phone] for details.  Finding Your New Normal [facility]) class through the [place].  Call [phone] for details.  Other resources: Nurse, learning disability.org, Call the [place] at [phone] for additional resources.  Insomnia or trouble sleeping  Practice good sleep habits  Discuss treatments of hot flashes  Consider treatment for anxiety  Read this from the [place] http://www.cancer.org/treatment/treatmentsandsideeffects/physicalsideeffects/fatigue/fatigueinpeoplewithcancer/fatigue-in-people-with-cancer-treating-fatigue  Weight loss/Loss of appetite  Dietary counseling with a Registered Dietician  Attend a healthy cooking class at [facility]. Call [phone] for details.  If your weight loss has been significant (greater than 15 pounds in 6 months or less), then call your doctor.  You need to be seen and evaluated.   Appetite stimulant medications can help improve appetite and help you gain weight.   Eat high-calorie foods as often as you can tolerate.   Weight gain or overweight  ( BMI over 25.0 m  or weight gain of >15 lbs)   Dietary counseling with a Registered Dietician  Attend a healthy cooking class at [facility]. Call [phone] for details.  Referral to a weight management support group (e.g. Weight Watchers, Overeaters Anonymous)  Change in your eating habits, your taste, or your smell . Choose foods with tart flavors like lemon wedges, lemonade, citrus fruits, vinegar, and pickled foods.  . Add sweeteners or a little bit of sugar to foods. A little sweetness can help increase  pleasant tastes. . Season foods with herbs/spices/or other seasonings like onion, garlic, chili powder, barbecue sauce, mustard, ketchup, mint, etc.  . If meats taste strange, marinate or cook meats in sweet juices, fruits, acidic dressings, or wine.  . If certain foods or drinks smell unpleasant while cooking, choose foods that do not need to be cooked, like cold sandwiches, crackers and cheese, yogurt and fruit, or cold cereal.  . Keep your mouth clean and healthy by rinsing and brushing your teeth after meals and before bed.   Decreased range of motion   Referral to Physical Therapy  Consider taking yoga or Tai Chi classes at the [facility].  Call [phone] for a schedule.  Intimacy and sexuality  Evaluation and treatment for anxiety, depression, as appropriate  Address body image concerns  Attend the American Cancer Society's Look Good.Feel Better program. Call [phone] for a schedule of when this event is offered.   Try a vaginal lubricant (Astroglide) or moisturizer (Replens- 1x every 3 days)  Consider attending support groups at [facility]. Call [phone] for details.  Attend the Finding Your New Normal [facility]) class at [facility]. Call [phone] for details.  Marital/partner/family relationships  Referral to Forensic psychologist Groups at the [employer]   Finding Your New Normal [facility]) class through the [place]. Call [phone] for details.  Stigma of lung cancer diagnosis   Do not blame yourself for having cancer!  If you are experiencing anxiety or depression as a result of what others think about your diagnosis, then let us help you!  Referral to Education officer, museum or Counselor  Referral to [employer], health insurance, and/or finances  Referral to Education officer, museum  Concerns regarding spirituality, faith, coping, relating to God, loss of faith, facing my mortality, and/or loss of my sense of purpose  Referral to Chaplain   Referral  to [employer] activities: www.hirschwellnessnetwork.org  Support Groups at the [facility]. Call [phone] for details.  Finding Your New Normal [facility]) class through [facility].  Call [phone] for details.  Returning to an exercise program  Consider joining the [facility], a 12-week program that meets twice a week for 90 minutes using traditional  exercise methods to ease you back into fitness and help you maintain a healthy weight.   This program is FREE for you and a friend.  Offered at several local [place].    Contact Elaina Hoops at [phone] or [email or web address]  Consider joining [place] or Yoga at [facility]. Call [phone] for a schedule.

## 2015-09-20 ENCOUNTER — Ambulatory Visit (HOSPITAL_BASED_OUTPATIENT_CLINIC_OR_DEPARTMENT_OTHER): Payer: Self-pay | Admitting: Adult Health

## 2015-09-20 ENCOUNTER — Encounter: Payer: Self-pay | Admitting: Adult Health

## 2015-09-20 ENCOUNTER — Ambulatory Visit
Admission: RE | Admit: 2015-09-20 | Discharge: 2015-09-20 | Disposition: A | Discharge status: Home / Self Care | Payer: BLUE CROSS/BLUE SHIELD | Source: Ambulatory Visit | Attending: Radiation Oncology | Admitting: Radiation Oncology

## 2015-09-20 ENCOUNTER — Encounter: Payer: Self-pay | Admitting: Radiation Oncology

## 2015-09-20 VITALS — BP 147/87 | HR 66 | Temp 98.2°F | Resp 20 | Wt 195.5 lb

## 2015-09-20 VITALS — BP 147/87 | HR 66 | Temp 98.2°F | Resp 20 | Ht 68.0 in | Wt 195.5 lb

## 2015-09-20 DIAGNOSIS — C3412 Malignant neoplasm of upper lobe, left bronchus or lung: Secondary | ICD-10-CM

## 2015-09-20 NOTE — Progress Notes (Signed)
CLINIC:  Survivorship  REASON FOR VISIT:  Survivorship Care Plan visit & to address acute survivorship needs   BRIEF ONCOLOGY HISTORY: Oncology History   08/2014: Patient presented with back pain; lung nodule found incidentally on CT in work-up of back pain; subsequent PET scan showed mild hypermetabolic activity in LUL lung.     Primary cancer of left upper lobe of lung ([place])   09/14/2014 PET scan Solitary LUL pulmonary nodule measuring approx 17 x 15 mm is weakly hypermetabolic, SUV max 1.9. No mediastinal or hilar adenopathy. No metastatic disease. Aneurysmal dilatation of ascending thoracic aorta-recommend semi-annual follow-up with CTA/MRA.    10/16/2014 Imaging CT chest (for bronch planning): LUL with sub-solid lesion measuring 2.1 x 1.5 cm, slightly enlarged from PET-CT. No other pulm nodules identified. No endobronchial lesion. No evidence of metastatic disease.    10/18/2014 Procedure Bronch with LUL biopsy. Path revealed focal atypia with fibrosis; findings not diagnostic of malignancy.    01/24/2015 Imaging CT chest for routine f/u of lung nodule: Similar size part solid nodule measuring 2.2 cm with internal solid component measuring 1.7 cm. Remains concerning for pulmonary adenocarcinoma and correlation with biopsy and/or surgical resection recommended.    05/01/2015 Imaging CT chest: Interval growth of mixed subsolid & solid nodule in LUL, now measuring 3.1 x 2.3 cm, concerning for primary bronchogenic adenocarcinoma.    05/21/2015 Surgery LUL lobectomy, left muscle-sparing thoracotomy ([facility]). Path revealed well differentiated adenocarcinoma, spanning 2.6 cm. (+) margin at stapled parenchymal resection margin. Level 11 LN x 1 neg. Level 12 LN x 1 neg.    05/21/2015 Pathologic Stage pT1b, pN0: Stage IA   05/21/2015 Initial Diagnosis Primary cancer of left upper lobe of lung ([place])   07/03/2015 - 08/16/2015 Radiation Therapy External beam radiation ([place]). Left mid chest, surgical clips:  Total dose 66 gray in 33 fractions.     INTERVAL HISTORY:  Mr. Hang reports to the survivorship clinic today after completing radiation therapy about 1 month ago.  He is doing very well with largely no physical complaints. He endorses an occasional cough, usually dry but occasionally will notice some green mucus/phlegm with coughing.  His wife tells him that he wheezes in his sleep, but he does not notice any problems with his breathing during the day.  "I know I can't go run a marathon, but I am able to do the things I enjoy doing without any problems."  He tells me that enjoys playing golf and has been able to do so since completing treatment.  The skin on his back "where it was burnt during radiation" is healed. He is pleased with his recovery from his cancer treatments.  He has no emotional or social concerns at this time as well.   ADDITIONAL REVIEW OF SYSTEMS:  Review of Systems  Constitutional: Negative for chills, weight loss and malaise/fatigue.  Respiratory: Positive for cough and wheezing.   Cardiovascular: Negative for chest pain.  Gastrointestinal: Negative for nausea, vomiting, diarrhea and constipation.  Genitourinary: Negative for dysuria.  Neurological: Negative for dizziness.  Psychiatric/Behavioral: Negative for depression. The patient is not nervous/anxious.      PAST MEDICAL & SURGICAL HISTORY:  Past Medical History  Diagnosis Date  . High cholesterol   . Hypertension   . Bowel obstruction ([place])   . Aortic aneurysm ([facility])     DR BARTLE    JUST WATCHING   . Shortness of breath dyspnea     WITH EXERTION   . GERD (gastroesophageal reflux disease)   .  Scoliosis deformity of spine   . Meniere's disease of left ear   . Shingles     04/2014     RIGHT EYE, SCALP  . Headache     hx of 2 migraines as a college roommates  . Arthritis     spine  . Sleep related leg cramps   . Melanoma ([place])     also basal cell carcinomas  . Shingles     right eye, forehead and head    . Syncopal episodes     x 1 in October, 2015.  . Radiation 07/03/2015-08/16/2015    Left Mid Chest, surgical clips 66 gray   Past Surgical History  Procedure Laterality Date  . Melanoma excision    . Hernia repair    . Appendectomy    . Tonsillectomy    . Cataract extraction w/ intraocular lens  implant, bilateral    . Colon surgery      6 YRS AGO  "SNIPPED BAND BINDING INTESTINE"  . Video bronchoscopy with endobronchial navigation N/A 10/18/2014    Procedure: VIDEO BRONCHOSCOPY WITH ENDOBRONCHIAL NAVIGATION;  Surgeon: Collene Gobble, MD;  Location: [facility];  Service: Thoracic;  Laterality: N/A;  . Surgery for bowel obstruction    . Pilonadal cyst removed    . Vasectomy    . Colonoscopy    . Thoracotomy/lobectomy Left 05/21/2015    Procedure: LEFT THORACOTOMY/LEFT UPPER LOBECTOMY;  Surgeon: Gaye Pollack, MD;  Location: [facility];  Service: Thoracic;  Laterality: Left;    SOCIAL HISTORY: Mr. Carrick is married and lives with his wife in [place], [place].  His father died from prostate cancer at his mid-42s. His mother died from lung cancer in her 72s very quickly after she was diagnosed.  He is a former smoker; smoked for 25 years; quit about 33 years ago and has not smoked since.    CURRENT MEDICATIONS:  Current Outpatient Prescriptions on File Prior to Visit  Medication Sig Dispense Refill  . Calcium Carb-Cholecalciferol (CALCIUM 600 + D PO) Take 600 mg by mouth daily.    . carvedilol (COREG) 25 MG tablet Take 1 tablet (25 mg total) by mouth 2 (two) times daily with a meal. 60 tablet 3  . guaiFENesin (MUCINEX) 600 MG 12 hr tablet Take 1 tablet (600 mg total) by mouth 2 (two) times daily as needed for cough or to loosen phlegm. (Patient not taking: Reported on 08/14/2015)    . hyaluronate sodium (RADIAPLEXRX) GEL Apply 1 application topically 2 (two) times daily. Reported on 09/20/2015    . ibuprofen (ADVIL,MOTRIN) 200 MG tablet Take 200 mg by mouth every 6 (six) hours as needed for mild  pain or moderate pain.    . Multiple Vitamin (MULTIVITAMIN WITH MINERALS) TABS tablet Take 2 tablets by mouth daily.    . predniSONE (DELTASONE) 20 MG tablet Take 60 mg by mouth See admin instructions. Reported on 09/20/2015    . rosuvastatin (CRESTOR) 10 MG tablet Take 5 mg by mouth every Monday, Wednesday, and Friday. In AM    . telmisartan-hydrochlorothiazide (MICARDIS HCT) 40-12.5 MG tablet TAKE 1/2 TABLET DAILY. 15 tablet 10  . Wound Dressings (SONAFINE) Apply 1 application topically 2 (two) times daily. Reported on 09/20/2015     No current facility-administered medications on file prior to visit.    ALLERGIES: Allergies  Allergen Reactions  . Dust Mite Extract Other (See Comments)    Runny nose     PHYSICAL EXAM:  Filed Vitals:   09/20/15  0906  BP: 147/87  Pulse: 66  Temp: 98.2 F (36.8 C)  Resp: 20   Filed Weights   09/20/15 0906  Weight: 195 lb 8 oz (88.678 kg)    General: Male in no acute distress.  Unaccompanied today.    HEENT: Head is atraumatic and normocephalic.  Pupils equal and reactive to light. Conjunctivae clear without exudate.  Sclerae anicteric. Oral mucosa is pink and moist without lesions. Oropharynx is pink and moist, without lesions. Lymph: No cervical, supraclavicular, or infraclavicular lymphadenopathy noted on palpation.   Cardiovascular: Normal rate and rhythm Respiratory: LUL diminished. Left base clear. Right upper and lower lobe with expiratory wheezes.  Right base diminished. Chest expansion symmetric without accessory muscle use. Breathing non-labored.   GU: Deferred.   Neuro: No focal deficits. Steady gait.   Psych: Normal mood and affect for situation. Extremities: No edema, cyanosis, or clubbing.   Skin: Warm and dry. Small bandage to left temporal area and left forearm s/p dermatology excision.  LABORATORY DATA:  None for this visit.   DIAGNOSTIC IMAGING:  None for this visit.    ASSESSMENT & PLAN:  Mr. Woolbright is a pleasant 75  y.o. male with history of Stage IA non-small cell lung cancer, treated with left upper lobectomy and external beam radiation therapy; completed treatment on 08/16/15.  Patient presents to survivorship clinic today for survivorship care plan visit and to address any acute survivorship concerns since completing treatment.   1. History of non-small cell lung cancer:  Mr. Spiller has recovered extremely well from the effects of surgery and adjuvant radiation therapy.  Today, he received a copy of his [facility]) document, which was reviewed with him in detail.  The SCP details his cancer treatment history and potential late/long-term side effects of those treatments.  We discussed the follow-up schedule he can anticipate with interval imaging for surveillance of his cancer.  I have also shared a copy of his treatment summary/SCP with his PCP.  He will have CTA in 09/2015 with Dr. Cyndia Bent for follow-up of his aortic aneurysm.  He will have radiation oncology follow-up with Dr. Sondra Come in 11/2015.   The patient will continue to see Dr. Sondra Come and Dr. Cyndia Bent for cancer surveillance until he is deemed appropriate to transfer to long-term cancer survivorship where I will see him for annual H&P with CT chest.  Mr. Vanderwerf voiced understanding and agreement with this plan.    2. Cough: His cough is likely treatment related given his left upper lobectomy, as well as adjuvant radiation therapy.  This is not bothersome for him and does not warrant additional intervention at this time.  I encouraged him to contact me if his cough worsens, as we can pursue additional interventions at that time, if needed.     3. Smoking cessation: I commended Mr. Reihl continued efforts to remain tobacco-free.  We discussed that one of the most important risk reduction strategies in preventing cancer recurrence in lung cancer patients is smoking cessation.  He is committed to abstaining from tobacco.    4. Physical  activity/Healthy eating: Getting adequate physical activity and maintaining a healthy diet as a cancer survivor is important for overall wellness and reduces the risk of cancer recurrence. We discussed the [facility], which is a fitness program that is offered to cancer survivors free of charge.  We also reviewed "The Nutrition Rainbow" handout, as well as the American Cancer Society's booklet with recommendations for nutrition and physical activity.  5. Health promotion/Cancer screening:  Mr. Shuey is reportedly up-to-date on his colonoscopy, PSA tests, skin screenings, and vaccinations.  He is very motivated to maintain his cancer screenings given his family history of prostate and lung cancers.  I commended his efforts and reviewed with him the national guidelines for colon, prostate, and skin cancer screenings.  I encouraged him to discuss the frequency of these screenings with his PCP, which is largely based on previous exams, family history, or personal history of cancer.    6. Sun safety:  I reviewed with him the importance of sun safety, particularly in the areas of his chest that were treated with radiation, as these areas will be very sun sensitive. He has a history of melanoma, basal cell & squamous cell skin cancers, which were all surgically removed by his dermatologist.  He recently had 2 lesions removed (1 from his left face/temporal region and 1 from his left arm); pathology is pending on these.  Mr. Lagrange is very aware of the importance of wearing sun-protective clothing, as well as sunscreen.  He sees his dermatologist regularly for skin surveillance exams.  I encouraged him to maintain these follow-up appointments, as deemed necessary by his skin care team.   7. Support services/Counseling: It is not uncommon for this period of the patient's cancer care trajectory to be one of many emotions and stressors.  Mr. Madalyn Rob was encouraged to take advantage of our many support  services programs, support groups, and/or counseling in coping with her new life as a cancer survivor after completing anti-cancer treatment. He was given a calendar of the cancer center's support services events.  Mr. Glinski is coping well and currently does not need a referral to any support services. However, I encouraged him to let me know if he needed assistance in the future and I would be happy to get him connected with team members to get his needs addressed.  He voiced understanding and appreciation.     Dispo:  -CTA and follow-up with Dr. Cyndia Bent in 09/2015 -Rad Onc follow-up with Dr. Sondra Come in 11/2015 -Return to survivorship clinic as needed; no additional follow-up needed at this time.  -Consider transitioning the patient to long-term survivorship, when clinically appropriate.   A total of 30 minutes was spent in face-to-face care of this patient, with greater than 50% of that time spent in counseling and care coordination.   Mike Craze, NP Survivorship Program [facility] [phone]  (Coding/Billing notation: This patient is a no charge per Dr. Pablo Ledger, Medical Director of Survivorship; pt has never been seen by medical oncology)

## 2015-09-20 NOTE — Progress Notes (Signed)
Follow up  S/p radiation left lung mid chest 07/03/15-08/16/15, no pain or nausea, does have productive cough at night and wheezing when sleeping,wakes up and coughs up phelgm and then wheezing stops,  No other issues stated, back has healed just tanned. Knows he will see Elzie Rings after MD 9:14 AM BP 147/87 mmHg  Pulse 66  Temp(Src) 98.2 F (36.8 C) (Oral)  Resp 20  Ht '5\' 8"'$  (1.727 m)  Wt 195 lb 8 oz (88.678 kg)  BMI 29.73 kg/m2  SpO2 99%  Wt Readings from Last 3 Encounters:  09/20/15 195 lb 8 oz (88.678 kg)  08/14/15 193 lb 12.8 oz (87.907 kg)  08/07/15 193 lb 11.2 oz (87.862 kg)

## 2015-09-20 NOTE — Progress Notes (Signed)
Radiation Oncology         [phone]) [phone] ________________________________  Name: George Montoya. MRN: [id number]  Date: 09/20/2015  DOB: 04/18/1941  Follow-Up Visit Note  CC: Donnajean Lopes, MD  Gaye Pollack, MD   Diagnosis:  Stage 1b Well-differentiated adenocarcinoma of the left lung with positive surgical margins   Radiation treatment dates:   07/03/2015-08/16/2015  Site/dose:   Left mid chest, surgical clips, 66 gray in 33 fractions  Narrative:  The patient returns today for routine follow-up.S/p radiation left lung mid chest 07/03/15-08/16/15, no pain or nausea, does have productive cough at night and wheezing when sleeping,wakes up and coughs up phelgm and then wheezing stops. Staying physically active. No other issues stated, back has healed just tanned. Knows he will see Elzie Rings after MD. Denies hemoptysis or pain in the chest. He states his strength is improving well. Complains of increased urinary frequency and occasional leg/hand cramps.             ALLERGIES:  is allergic to dust mite extract.  Meds: Current Outpatient Prescriptions  Medication Sig Dispense Refill  . Calcium Carb-Cholecalciferol (CALCIUM 600 + D PO) Take 600 mg by mouth daily.    . carvedilol (COREG) 25 MG tablet Take 1 tablet (25 mg total) by mouth 2 (two) times daily with a meal. 60 tablet 3  . ibuprofen (ADVIL,MOTRIN) 200 MG tablet Take 200 mg by mouth every 6 (six) hours as needed for mild pain or moderate pain.    . Multiple Vitamin (MULTIVITAMIN WITH MINERALS) TABS tablet Take 2 tablets by mouth daily.    . rosuvastatin (CRESTOR) 10 MG tablet Take 5 mg by mouth every Monday, Wednesday, and Friday. In AM    . telmisartan-hydrochlorothiazide (MICARDIS HCT) 40-12.5 MG tablet TAKE 1/2 TABLET DAILY. 15 tablet 10  . guaiFENesin (MUCINEX) 600 MG 12 hr tablet Take 1 tablet (600 mg total) by mouth 2 (two) times daily as needed for cough or to loosen phlegm. (Patient not taking: Reported on 08/14/2015)     . hyaluronate sodium (RADIAPLEXRX) GEL Apply 1 application topically 2 (two) times daily. Reported on 09/20/2015    . predniSONE (DELTASONE) 20 MG tablet Take 60 mg by mouth See admin instructions. Reported on 09/20/2015    . Wound Dressings (SONAFINE) Apply 1 application topically 2 (two) times daily. Reported on 09/20/2015     No current facility-administered medications for this encounter.    Physical Findings: The patient is in no acute distress. Patient is alert and oriented.  height is '5\' 8"'$  (1.727 m) and weight is 195 lb 8 oz (88.678 kg). His oral temperature is 98.2 F (36.8 C). His blood pressure is 147/87 and his pulse is 66. His respiration is 20 and oxygen saturation is 99%. .  No significant changes. No palpable cervical, supraclavicular or axillary lymphoadenopathy. The heart has a regular rate and rhythm. The lungs are clear to auscultation. Mild Hyperpigmentation changes in the treatment area. Skin is well healed.   Lab Findings: Lab Results  Component Value Date   WBC 10.0 05/22/2015   HGB 12.0* 05/22/2015   HCT 34.9* 05/22/2015   MCV 100.0 05/22/2015   PLT 181 05/22/2015    Radiographic Findings: No results found.  Impression:  The patient is recovering from the effects of radiation.  No evidence of disease recurrence on clinical exam.   Plan:  CT scan scheduled April 19th. he will see Dr. Cyndia Bent the same day.  Radiation oncology follow up in 3  months.  Advised him to refer to his urologist for his frequency issues.  -----------------------------------  Blair Promise, PhD, MD  This document serves as a record of services personally performed by Gery Pray, MD. It was created on his behalf by Derek Mound, a trained medical scribe. The creation of this record is based on the scribe's personal observations and the provider's statements to them. This document has been checked and approved by the attending provider.

## 2015-10-04 LAB — CREATININE, ISTAT: CREATININE, ISTAT: 1.1 mg/dL (ref 0.6–1.3)

## 2015-10-09 ENCOUNTER — Other Ambulatory Visit: Payer: Self-pay | Admitting: Surgery

## 2015-10-10 ENCOUNTER — Ambulatory Visit (INDEPENDENT_AMBULATORY_CARE_PROVIDER_SITE_OTHER): Payer: BLUE CROSS/BLUE SHIELD | Admitting: Surgery

## 2015-10-10 ENCOUNTER — Encounter: Payer: Self-pay | Admitting: Surgery

## 2015-10-10 ENCOUNTER — Ambulatory Visit
Admission: RE | Admit: 2015-10-10 | Discharge: 2015-10-10 | Disposition: A | Discharge status: Home / Self Care | Payer: BLUE CROSS/BLUE SHIELD | Source: Ambulatory Visit | Attending: Surgery | Admitting: Surgery

## 2015-10-10 VITALS — BP 177/82 | HR 55 | Resp 16 | Ht 68.0 in | Wt 195.0 lb

## 2015-10-10 DIAGNOSIS — I712 Thoracic aortic aneurysm, without rupture, unspecified: Secondary | ICD-10-CM

## 2015-10-10 DIAGNOSIS — I7121 Aneurysm of the ascending aorta, without rupture: Secondary | ICD-10-CM

## 2015-10-10 DIAGNOSIS — Z902 Acquired absence of lung [part of]: Secondary | ICD-10-CM

## 2015-10-10 DIAGNOSIS — C3412 Malignant neoplasm of upper lobe, left bronchus or lung: Secondary | ICD-10-CM | POA: Diagnosis not present

## 2015-10-10 MED ORDER — IOPAMIDOL (ISOVUE-370) INJECTION 76%
75.0000 mL | Freq: Once | INTRAVENOUS | Status: AC | PRN
  Administered 2015-10-10: 75 mL via INTRAVENOUS

## 2015-10-10 NOTE — Progress Notes (Signed)
HPI:  The patient returns for follow up s/p left upper lobectomy for a pT1b, pN0 stage IA well differentiated adenocarcinoma on 05/21/2015. He essentially had no fissure between the upper and lower lobes and the stapled resection margin was positive. He was not a candidate for a completion pneumonectomy and received XRT to 66 gray in 33 fractions completed on 08/16/2015. His only problem with this was some radiation burns on his back that resolved. He says that he feels well and had been out walking and playing golf. He says that the only time that he has noticed some dyspnea was with walking [place] 5 [place]. He has been eating well and maintaining his weight. He notes occasional wheezing in the early morning when he wakes up that clears with coughing. He denies headaches, visual changes, hemoptysis, any pain.  Current Outpatient Prescriptions  Medication Sig Dispense Refill  . Calcium Carb-Cholecalciferol (CALCIUM 600 + D PO) Take 600 mg by mouth daily.    . carvedilol (COREG) 25 MG tablet Take 1 tablet (25 mg total) by mouth 2 (two) times daily with a meal. 60 tablet 3  . hyaluronate sodium (RADIAPLEXRX) GEL Apply 1 application topically 2 (two) times daily. Reported on 09/20/2015    . ibuprofen (ADVIL,MOTRIN) 200 MG tablet Take 200 mg by mouth every 6 (six) hours as needed for mild pain or moderate pain.    . Multiple Vitamin (MULTIVITAMIN WITH MINERALS) TABS tablet Take 2 tablets by mouth daily.    . predniSONE (DELTASONE) 20 MG tablet Take 60 mg by mouth See admin instructions. Reported on 09/20/2015....ONLY TAKES WHEN HEARING STARTS TO DIMINISH    . rosuvastatin (CRESTOR) 10 MG tablet Take 5 mg by mouth every Monday, Wednesday, and Friday. In AM    . telmisartan-hydrochlorothiazide (MICARDIS HCT) 40-12.5 MG tablet TAKE 1/2 TABLET DAILY. 15 tablet 10   No current facility-administered medications for this visit.     Physical Exam: BP 177/82 mmHg  Pulse 55  Resp 16  Ht  '5\' 8"'$  (1.727 m)  Wt 195 lb (88.451 kg)  BMI 29.66 kg/m2  SpO2 98% He looks well There is no cervical or supraclavicular adenopathy Lungs are clear The left chest incision looks good.   Diagnostic Tests: CLINICAL DATA: LEFT lung cancer diagnosed 2016 with thoracotomy and lobectomy November 2016. Thoracic aortic aneurysm.  EXAM: CT ANGIOGRAPHY CHEST WITH CONTRAST  TECHNIQUE: Multidetector CT imaging of the chest was performed using the standard protocol during bolus administration of intravenous contrast. Multiplanar CT image reconstructions and MIPs were obtained to evaluate the vascular anatomy.  CONTRAST: Study 5 mL Isovue  COMPARISON: CT thorax 05/01/2015  FINDINGS: Mediastinum/Nodes: The ascending aorta measures measures 35 mm above the sino-tubular junction (image 48, series 601, coronal series). Mild intimal calcification of the origin of the great vessels. The descending thoracic aorta normal.  Nocentral pulmonary embolism. Postsurgical change consistent with LEFT upper lobe lobectomy.  No axillary supraclavicular adenopathy. No mediastinal hilar adenopathy. Esophagus normal.  Lungs/Pleura: There are bands of parenchymal consolidation in LEFT upper lung following LEFT upper lobectomy. Thickening along the surgical staple line is noted. Noted discrete measurable nodularity or mass. Bronchiectasis in the consolidative bands of the LEFT upper lung and LEFT lower lobe.  There are patchy ground-glass opacities in the superior segment of the LEFT lower lobe. RIGHT lung is clear. Subpleural nodule RIGHT middle lobe appears benign on image 90, series 5  Upper abdomen: Limited view of the liver, kidneys, pancreas  are unremarkable. Normal adrenal glands. High-density round 12 mm lesion extending from the posterior LEFT kidney cannot be fully characterized.  Musculoskeletal: No aggressive osseous lesion.  Review of the MIP images confirms the above  findings.  IMPRESSION: 1. Bands of parenchymal consolidation in the LEFT upper lung related to postradiation change along the surgical staple line. No new discrete measurable nodularity. 2. Patchy ground-glass opacities in LEFT lower lobe relate to radiation change. 3. Small indeterminate lesion of the LEFT kidney. Recommend attention on follow-up   Electronically Signed  By: Suzy Bouchard M.D.  On: 10/10/2015 12:21    Impression:  He is doing well following surgery and XRT. The chest CT shows radiation changes in the left lung but nothing of concern at this point. The small ascending aortic aneurysm is 35 mm and unchanged.  Plan:  He is going to see Dr. Sondra Come in June and I will see him in 6 months with a chest CT.   Gaye Pollack, MD [facility] [phone]

## 2016-01-10 ENCOUNTER — Other Ambulatory Visit: Payer: Self-pay | Admitting: Emergency Medicine

## 2016-02-21 ENCOUNTER — Ambulatory Visit: Payer: BLUE CROSS/BLUE SHIELD | Admitting: Radiation Oncology

## 2016-03-06 ENCOUNTER — Ambulatory Visit
Admission: RE | Admit: 2016-03-06 | Payer: BLUE CROSS/BLUE SHIELD | Source: Ambulatory Visit | Admitting: Radiation Oncology

## 2016-03-11 ENCOUNTER — Telehealth: Payer: Self-pay | Admitting: Oncology

## 2016-03-11 NOTE — Telephone Encounter (Signed)
Left a message for George Montoya advising him that a CT scan of his chest will be ordered and scheduled before he will see Dr. Sondra Come.  Advised him that we will be calling back once the CT scan appointment time was arranged.

## 2016-03-13 ENCOUNTER — Other Ambulatory Visit: Payer: Self-pay | Admitting: Surgery

## 2016-03-13 DIAGNOSIS — C3490 Malignant neoplasm of unspecified part of unspecified bronchus or lung: Secondary | ICD-10-CM

## 2016-04-08 ENCOUNTER — Telehealth: Payer: Self-pay | Admitting: Oncology

## 2016-04-08 NOTE — Telephone Encounter (Signed)
Called George Montoya and asked if he would like to see Dr. Sondra Come after his CT Scan and appointment with Dr. Cyndia Bent tomorrow.  He said that would be great. Scheduled his appointment for 4:30 on 04/09/16.

## 2016-04-09 ENCOUNTER — Ambulatory Visit (INDEPENDENT_AMBULATORY_CARE_PROVIDER_SITE_OTHER): Payer: BLUE CROSS/BLUE SHIELD | Admitting: Surgery

## 2016-04-09 ENCOUNTER — Encounter: Payer: Self-pay | Admitting: Surgery

## 2016-04-09 ENCOUNTER — Ambulatory Visit
Admission: RE | Admit: 2016-04-09 | Discharge: 2016-04-09 | Disposition: A | Discharge status: Home / Self Care | Payer: BLUE CROSS/BLUE SHIELD | Source: Ambulatory Visit | Attending: Surgery | Admitting: Surgery

## 2016-04-09 ENCOUNTER — Ambulatory Visit
Admission: RE | Admit: 2016-04-09 | Discharge: 2016-04-09 | Disposition: A | Discharge status: Home / Self Care | Payer: BLUE CROSS/BLUE SHIELD | Source: Ambulatory Visit | Attending: Radiation Oncology | Admitting: Radiation Oncology

## 2016-04-09 ENCOUNTER — Encounter: Payer: Self-pay | Admitting: Radiation Oncology

## 2016-04-09 VITALS — BP 172/81 | HR 64 | Resp 16 | Ht 68.0 in | Wt 180.0 lb

## 2016-04-09 DIAGNOSIS — Z923 Personal history of irradiation: Secondary | ICD-10-CM | POA: Insufficient documentation

## 2016-04-09 DIAGNOSIS — I712 Thoracic aortic aneurysm, without rupture: Secondary | ICD-10-CM

## 2016-04-09 DIAGNOSIS — Z79899 Other long term (current) drug therapy: Secondary | ICD-10-CM | POA: Insufficient documentation

## 2016-04-09 DIAGNOSIS — Z902 Acquired absence of lung [part of]: Secondary | ICD-10-CM

## 2016-04-09 DIAGNOSIS — C3490 Malignant neoplasm of unspecified part of unspecified bronchus or lung: Secondary | ICD-10-CM

## 2016-04-09 DIAGNOSIS — C3492 Malignant neoplasm of unspecified part of left bronchus or lung: Secondary | ICD-10-CM | POA: Insufficient documentation

## 2016-04-09 DIAGNOSIS — C3412 Malignant neoplasm of upper lobe, left bronchus or lung: Secondary | ICD-10-CM

## 2016-04-09 DIAGNOSIS — I7121 Aneurysm of the ascending aorta, without rupture: Secondary | ICD-10-CM

## 2016-04-09 NOTE — Progress Notes (Signed)
Radiation Oncology         [phone]) [phone] ________________________________  Name: George Montoya. MRN: [id number]  Date: 04/09/2016  DOB: Jan 26, 1941  Follow-Up Visit Note  CC: Donnajean Lopes, MD  Gaye Pollack, MD   Diagnosis:  Stage 1b Well-differentiated adenocarcinoma of the left lung with positive surgical margins   Radiation treatment dates:   07/03/2015-08/16/2015  Site/dose:   Left mid chest, surgical clips, 66 gray in 33 fractions  Narrative:  The patient returns today for routine follow-up.S/p radiation left lung mid chest 07/03/15-08/16/15.  The patient denies changes in breathing issues, and pain in the chest area.  The patient reports that his appetite is good, and he has voluntarily lost 15lbs. Patient also denies headaches and back pain.    ALLERGIES:  is allergic to dust mite extract.  Meds: Current Outpatient Prescriptions  Medication Sig Dispense Refill  . Calcium Carb-Cholecalciferol (CALCIUM 600 + D PO) Take 600 mg by mouth daily.    . carvedilol (COREG) 25 MG tablet Take 1 tablet (25 mg total) by mouth 2 (two) times daily with a meal. 60 tablet 3  . ibuprofen (ADVIL,MOTRIN) 200 MG tablet Take 200 mg by mouth every 6 (six) hours as needed for mild pain or moderate pain.    . Multiple Vitamin (MULTIVITAMIN WITH MINERALS) TABS tablet Take 2 tablets by mouth daily.    . rosuvastatin (CRESTOR) 10 MG tablet Take 5 mg by mouth every Monday, Wednesday, and Friday. In AM    . telmisartan-hydrochlorothiazide (MICARDIS HCT) 40-12.5 MG tablet TAKE 1/2 TABLET DAILY. 15 tablet 10  . predniSONE (DELTASONE) 20 MG tablet Take 60 mg by mouth See admin instructions. Reported on 09/20/2015....ONLY TAKES WHEN HEARING STARTS TO DIMINISH     No current facility-administered medications for this encounter.     Physical Findings: The patient is in no acute distress. Patient is alert and oriented.  height is '5\' 8"'$  (1.727 m) and weight is 185 lb 3.2 oz (84 kg). His oral temperature  is 98.3 F (36.8 C). His blood pressure is 163/88 (abnormal) and his pulse is 61. His oxygen saturation is 100%. .  No significant changes. No palpable cervical, supraclavicular or axillary lymphoadenopathy. The heart has a regular rate and rhythm. The lungs are clear to auscultation. Mild Hyperpigmentation changes in the treatment area. Skin is well healed. Thoracotomy scar is well healed without visible sign of infection.   Lab Findings: Lab Results  Component Value Date   WBC 10.0 05/22/2015   HGB 12.0 (L) 05/22/2015   HCT 34.9 (L) 05/22/2015   MCV 100.0 05/22/2015   PLT 181 05/22/2015    Radiographic Findings: Ct Chest Wo Contrast  Result Date: 04/09/2016 CLINICAL DATA:  Lung cancer, follow-up post surgery and radiation therapy, history hypertension and aortic aneurysm EXAM: CT CHEST WITHOUT CONTRAST TECHNIQUE: Multidetector CT imaging of the chest was performed following the standard protocol without IV contrast. Sagittal and coronal MPR images reconstructed from axial data set. COMPARISON:  10/10/2015 FINDINGS: Cardiovascular: Atherosclerotic calcifications aorta coronary arteries, and proximal great vessels. Ascending thoracic aorta 4.1 cm transverse image 58, previously 3.9 cm. Minimal pericardial fluid. Mediastinum/Nodes: Mild mediastinal shift to the LEFT secondary to LEFT upper lobe volume loss. No definite mediastinal or axillary adenopathy. Assessment for hilar adenopathy limited by lack of IV contrast. Esophagus unremarkable. Lungs/Pleura: Significant LEFT upper lobe volume loss and opacity with paramediastinal radiation therapy changes in LEFT upper pole collapse. Associated beading of LEFT upper lobe airways. Staple line LEFT  upper lobe with surgical clips at LEFT hilum. Remaining lungs clear. No infiltrate, pleural effusion or pneumothorax. No definite pulmonary mass/nodule identified. Mild chronic elevation of LEFT diaphragm. Upper Abdomen: Tiny exophytic cyst upper pole RIGHT  kidney unchanged. 13 mm exophytic intermediate attenuation nodule posterolateral upper pole LEFT kidney image 122 stable. Fatty infiltration of liver. Remaining visualized upper abdomen unremarkable. Musculoskeletal: Degenerative disc disease changes lumbar spine and visualized inferior cervical spine. No acute osseous lesions. IMPRESSION: Postsurgical and postradiation therapy changes of the LEFT upper lobe with increased LEFT upper lobe volume loss since prior exam. No new intra thoracic abnormalities. Stable tiny RIGHT renal cyst and intermediate attenuation LEFT renal nodule question complicated cyst. Aortic atherosclerosis and coronary arterial calcification with aneurysmal dilatation of the ascending thoracic aorta to 4.1 cm greatest size; recommendation below. Recommend annual imaging followup by CTA or MRA. This recommendation follows 2010 ACCF/AHA/AATS/ACR/ASA/SCA/SCAI/SIR/STS/SVM Guidelines for the Diagnosis and Management of Patients with Thoracic Aortic Disease. Circulation. 2010; 121: O130-Q657 Electronically Signed   By: Lavonia Dana M.D.   On: 04/09/2016 15:13    Impression:    No evidence of disease recurrence on clinical exam today as well as chest CT scan earlier today. He received a good report from thoracic surgery earlier today.  Plan:  I will follow up with the patient in approximately 3 months.   He will follow-up with Dr. Cyndia Bent in 6 months with a chest CT scan at that time -----------------------------------  Blair Promise, PhD, MD  This document serves as a record of services personally performed by Gery Pray, MD. It was created on his behalf by Truddie Hidden, a trained medical scribe. The creation of this record is based on the scribe's personal observations and the provider's statements to them. This document has been checked and approved by the attending provider.

## 2016-04-09 NOTE — Progress Notes (Signed)
George Montoya is here for follow up.  He denies having pain. He reports having shortness of breath with activity which he says has happened since surgery.  He reports having a cough and wheezing in the mornings.  He reports having a good energy level.  BP (!) 163/88 (BP Location: Right Arm, Patient Position: Sitting)   Pulse 61   Temp 98.3 F (36.8 C) (Oral)   Ht '5\' 8"'$  (1.727 m)   Wt 185 lb 3.2 oz (84 kg)   SpO2 100%   BMI 28.16 kg/m    Wt Readings from Last 3 Encounters:  04/09/16 185 lb 3.2 oz (84 kg)  04/09/16 180 lb (81.6 kg)  10/10/15 195 lb (88.5 kg)

## 2016-04-10 ENCOUNTER — Ambulatory Visit: Payer: BLUE CROSS/BLUE SHIELD | Admitting: Radiation Oncology

## 2016-04-11 ENCOUNTER — Encounter: Payer: Self-pay | Admitting: Surgery

## 2016-04-11 NOTE — Progress Notes (Signed)
HPI:   The patient returns for follow up s/p left upper lobectomy for a pT1b, pN0 stage IA well differentiated adenocarcinoma on 05/21/2015. He essentially had no fissure between the upper and lower lobes and the stapled resection margin was positive. He was not a candidate for a completion pneumonectomy and received XRT to 66 gray in 33 fractions completed on 08/16/2015. His only problem with this was some radiation burns on his back that resolved. He says that he feels well and had been out walking and playing golf. He says that the only time that he has noticed some dyspnea was with walking a long distance. He has been eating well and maintaining his weight. He notes occasional wheezing and sputum production in the early morning when he wakes up that clears with coughing. He denies headaches, visual changes, hemoptysis, bone pain.  Current Outpatient Prescriptions  Medication Sig Dispense Refill  . Calcium Carb-Cholecalciferol (CALCIUM 600 + D PO) Take 600 mg by mouth daily.    . carvedilol (COREG) 25 MG tablet Take 1 tablet (25 mg total) by mouth 2 (two) times daily with a meal. 60 tablet 3  . ibuprofen (ADVIL,MOTRIN) 200 MG tablet Take 200 mg by mouth every 6 (six) hours as needed for mild pain or moderate pain.    . Multiple Vitamin (MULTIVITAMIN WITH MINERALS) TABS tablet Take 2 tablets by mouth daily.    . rosuvastatin (CRESTOR) 10 MG tablet Take 5 mg by mouth every Monday, Wednesday, and Friday. In AM    . telmisartan-hydrochlorothiazide (MICARDIS HCT) 40-12.5 MG tablet TAKE 1/2 TABLET DAILY. 15 tablet 10  . predniSONE (DELTASONE) 20 MG tablet Take 60 mg by mouth See admin instructions. Reported on 09/20/2015....ONLY TAKES WHEN HEARING STARTS TO DIMINISH     No current facility-administered medications for this visit.      Physical Exam: BP (!) 172/81   Pulse 64   Resp 16   Ht '5\' 8"'$  (1.727 m)   Wt 180 lb (81.6 kg)   SpO2 98% Comment: RA  BMI 27.37 kg/m  He looks  well There is no cervical or supraclavicular adenopathy Lungs are clear The left chest incision looks good.   Diagnostic Tests:  CLINICAL DATA:  Lung cancer, follow-up post surgery and radiation therapy, history hypertension and aortic aneurysm  EXAM: CT CHEST WITHOUT CONTRAST  TECHNIQUE: Multidetector CT imaging of the chest was performed following the standard protocol without IV contrast. Sagittal and coronal MPR images reconstructed from axial data set.  COMPARISON:  10/10/2015  FINDINGS: Cardiovascular: Atherosclerotic calcifications aorta coronary arteries, and proximal great vessels. Ascending thoracic aorta 4.1 cm transverse image 58, previously 3.9 cm. Minimal pericardial fluid.  Mediastinum/Nodes: Mild mediastinal shift to the LEFT secondary to LEFT upper lobe volume loss. No definite mediastinal or axillary adenopathy. Assessment for hilar adenopathy limited by lack of IV contrast. Esophagus unremarkable.  Lungs/Pleura: Significant LEFT upper lobe volume loss and opacity with paramediastinal radiation therapy changes in LEFT upper pole collapse. Associated beading of LEFT upper lobe airways. Staple line LEFT upper lobe with surgical clips at LEFT hilum. Remaining lungs clear. No infiltrate, pleural effusion or pneumothorax. No definite pulmonary mass/nodule identified. Mild chronic elevation of LEFT diaphragm.  Upper Abdomen: Tiny exophytic cyst upper pole RIGHT kidney unchanged. 13 mm exophytic intermediate attenuation nodule posterolateral upper pole LEFT kidney image 122 stable. Fatty infiltration of liver. Remaining visualized upper abdomen unremarkable.  Musculoskeletal: Degenerative disc disease changes lumbar spine and visualized inferior cervical spine. No acute  osseous lesions.  IMPRESSION: Postsurgical and postradiation therapy changes of the LEFT upper lobe with increased LEFT upper lobe volume loss since prior exam.  No new intra  thoracic abnormalities.  Stable tiny RIGHT renal cyst and intermediate attenuation LEFT renal nodule question complicated cyst.  Aortic atherosclerosis and coronary arterial calcification with aneurysmal dilatation of the ascending thoracic aorta to 4.1 cm greatest size; recommendation below.  Recommend annual imaging followup by CTA or MRA. This recommendation follows 2010 ACCF/AHA/AATS/ACR/ASA/SCA/SCAI/SIR/STS/SVM Guidelines for the Diagnosis and Management of Patients with Thoracic Aortic Disease. Circulation. 2010; 121: B520-E022   Electronically Signed   By: Lavonia Dana M.D.   On: 04/09/2016 15:13   Impression:  He is doing well following surgery and XRT. I have personally reviewed and interpreted his chest CT. The chest CT shows radiation changes in the left lung that have increased with increased volume loss since his prior CT.  There is no new mass or nodule. The small ascending aortic aneurysm is 41 mm and does not require any therapy at this time.  Plan:  He has an appt with Dr. Sondra Come later today and I will plan to see him back in 6 months with a CT scan of the chest.   Gaye Pollack, MD [facility] [phone]

## 2016-04-16 ENCOUNTER — Ambulatory Visit: Payer: BLUE CROSS/BLUE SHIELD | Admitting: Radiation Oncology

## 2016-07-01 DIAGNOSIS — R197 Diarrhea, unspecified: Secondary | ICD-10-CM | POA: Diagnosis not present

## 2016-07-17 ENCOUNTER — Ambulatory Visit
Admission: RE | Admit: 2016-07-17 | Discharge: 2016-07-17 | Disposition: A | Discharge status: Home / Self Care | Payer: PPO | Source: Ambulatory Visit | Attending: Radiation Oncology | Admitting: Radiation Oncology

## 2016-07-17 ENCOUNTER — Encounter: Payer: Self-pay | Admitting: Radiation Oncology

## 2016-07-17 DIAGNOSIS — Z85118 Personal history of other malignant neoplasm of bronchus and lung: Secondary | ICD-10-CM | POA: Diagnosis not present

## 2016-07-17 DIAGNOSIS — Z888 Allergy status to other drugs, medicaments and biological substances status: Secondary | ICD-10-CM | POA: Insufficient documentation

## 2016-07-17 DIAGNOSIS — C3412 Malignant neoplasm of upper lobe, left bronchus or lung: Secondary | ICD-10-CM

## 2016-07-17 DIAGNOSIS — C3492 Malignant neoplasm of unspecified part of left bronchus or lung: Secondary | ICD-10-CM | POA: Insufficient documentation

## 2016-07-17 DIAGNOSIS — Z79899 Other long term (current) drug therapy: Secondary | ICD-10-CM | POA: Insufficient documentation

## 2016-07-17 DIAGNOSIS — Z08 Encounter for follow-up examination after completed treatment for malignant neoplasm: Secondary | ICD-10-CM | POA: Diagnosis not present

## 2016-07-17 DIAGNOSIS — R197 Diarrhea, unspecified: Secondary | ICD-10-CM | POA: Insufficient documentation

## 2016-07-17 NOTE — Progress Notes (Addendum)
George Montoya is here for follow up.  He denies having pain.  He mentioned that he has had diarrhea about 3 times a day since December.  He started taking Creon and it stopped yesterday.  He denies shortness of breath except for in the mountains.  He reports having a cough in the morning with clear sputum.  He denies having hemoptysis.  He reports having a good energy level.  BP (!) 161/89 (BP Location: Right Arm, Patient Position: Sitting)   Pulse 61   Temp 98.2 F (36.8 C) (Oral)   Ht '5\' 8"'$  (1.727 m)   Wt 185 lb (83.9 kg)   SpO2 100%   BMI 28.13 kg/m   Wt Readings from Last 3 Encounters:  07/17/16 185 lb (83.9 kg)  04/09/16 185 lb 3.2 oz (84 kg)  04/09/16 180 lb (81.6 kg)

## 2016-07-17 NOTE — Progress Notes (Signed)
Radiation Oncology         [phone]) [phone] ________________________________  Name: George Montoya. MRN: [id number]  Date: 07/17/2016  DOB: 07-29-1940  Follow-Up Visit Note  CC: Donnajean Lopes, MD  Gaye Pollack, MD   Diagnosis:  Stage IB Well-differentiated adenocarcinoma of the left lung with positive surgical margins   Radiation treatment dates:  11 months  07/03/2015-08/16/2015: Left mid chest, surgical clips, 66 gray in 33 fractions  Narrative:  The patient returns today for routine follow-up. He denies hemoptysis or pain. He reports an occasional productive cough with clear sputum in the morning after he wakes up. The patient reports he had diarrhea about 3 times a day since December. He started taking Creon and it stopped yesterday. He denies shortness of breath except for in the mountains or with exertion. He reports having a good energy level. He also states his voice gets hoarse at times. Last CT scan was in October 2017.  ALLERGIES:  is allergic to dust mite extract.  Meds: Current Outpatient Prescriptions  Medication Sig Dispense Refill  . Calcium Carb-Cholecalciferol (CALCIUM 600 + D PO) Take 600 mg by mouth daily.    . carvedilol (COREG) 25 MG tablet Take 1 tablet (25 mg total) by mouth 2 (two) times daily with a meal. 60 tablet 3  . CREON 24000-76000 units CPEP     . ibuprofen (ADVIL,MOTRIN) 200 MG tablet Take 200 mg by mouth every 6 (six) hours as needed for mild pain or moderate pain.    . Multiple Vitamin (MULTIVITAMIN WITH MINERALS) TABS tablet Take 2 tablets by mouth daily.    . rosuvastatin (CRESTOR) 10 MG tablet Take 5 mg by mouth every Monday, Wednesday, and Friday. In AM    . telmisartan-hydrochlorothiazide (MICARDIS HCT) 40-12.5 MG tablet TAKE 1/2 TABLET DAILY. 15 tablet 10  . predniSONE (DELTASONE) 20 MG tablet Take 60 mg by mouth See admin instructions. Reported on 09/20/2015....ONLY TAKES WHEN HEARING STARTS TO DIMINISH     No current  facility-administered medications for this encounter.     Physical Findings: The patient is in no acute distress. Patient is alert and oriented.  height is '5\' 8"'$  (1.727 m) and weight is 185 lb (83.9 kg). His oral temperature is 98.2 F (36.8 C). His blood pressure is 161/89 (abnormal) and his pulse is 61. His oxygen saturation is 100%.  Lungs are clear to auscultation bilaterally. Heart has regular rate and rhythm. No palpable cervical, supraclavicular or axillary lymphoadenopathy. Left thoracotomy scar well healed without sign of recurrence in this area. Previously noted rash along the trunk area.  Lab Findings: Lab Results  Component Value Date   WBC 10.0 05/22/2015   HGB 12.0 (L) 05/22/2015   HCT 34.9 (L) 05/22/2015   MCV 100.0 05/22/2015   PLT 181 05/22/2015    Radiographic Findings: No results found.  Impression:  No evidence of disease recurrence on clinical exam today as well as on the chest CT scan in October 2017.  Plan:  The patient will follow up with Dr. Cyndia Bent in approximately 3 months. I will follow up with the patient after his visit with Dr. Cyndia Bent and a CT scan of the chest. Dr. Cyndia Bent will order this chest CT. -----------------------------------  Blair Promise, PhD, MD  This document serves as a record of services personally performed by Gery Pray, MD. It was created on his behalf by Darcus Austin, a trained medical scribe. The creation of this record is based on the scribe's personal  observations and the provider's statements to them. This document has been checked and approved by the attending provider.

## 2016-07-24 ENCOUNTER — Encounter: Payer: Self-pay | Admitting: Nurse Practitioner

## 2016-08-05 ENCOUNTER — Encounter: Payer: Self-pay | Admitting: Nurse Practitioner

## 2016-08-05 ENCOUNTER — Ambulatory Visit (INDEPENDENT_AMBULATORY_CARE_PROVIDER_SITE_OTHER): Payer: PPO | Admitting: Nurse Practitioner

## 2016-08-05 VITALS — BP 170/88 | HR 68 | Ht 68.0 in | Wt 188.0 lb

## 2016-08-05 DIAGNOSIS — Z1211 Encounter for screening for malignant neoplasm of colon: Secondary | ICD-10-CM

## 2016-08-05 DIAGNOSIS — R197 Diarrhea, unspecified: Secondary | ICD-10-CM

## 2016-08-05 MED ORDER — NA SULFATE-K SULFATE-MG SULF 17.5-3.13-1.6 GM/177ML PO SOLN: 1.0000 | Freq: Once | ORAL | 0 refills | Status: AC

## 2016-08-05 NOTE — Patient Instructions (Addendum)
You have been scheduled for a colonoscopy. Please follow written instructions given to you at your visit today.  Please pick up your prep supplies at the pharmacy within the next 1-3 days. [employer]. If you use inhalers (even only as needed), please bring them with you on the day of your procedure. Your physician has requested that you go to www.[email or web address].com and enter the access code given to you at your visit today. This web site gives a general overview about your procedure. However, you should still follow specific instructions given to you by our office regarding your preparation for the procedure.

## 2016-08-05 NOTE — Progress Notes (Signed)
HPI:  Patient is a 76 year old male, previously followed by Dr. Sharlett Iles. He has a history of lung cancer, melanoma, and hypertension. Patient had a screening colonoscopy December 2008. Exam was normal, prep was fair. Patient referred now by his PCP Dr. Bevelyn Buckles for evaluation of non-bloody diarrhea. Typically he had a BM a day but now most of his stools are loose averaging 4-5 BMs a day. No nocturnal bowel movements . He has some urgency but no cramping  No recent travel. No recent antibiotics other than the Flagyl. No recent medication changes. The only dietary change has been protein shakes but he started those several months ago. He had some intentional weight loss. No response to 2 weeks of Flagyl. No improvement with trial of Creon. Kaopectate helps.  C. difficile is negative.    Past Medical History:  Diagnosis Date  . Aortic aneurysm ([facility])    DR BARTLE    JUST WATCHING   . Arthritis    spine  . Bowel obstruction   . GERD (gastroesophageal reflux disease)   . Headache    hx of 2 migraines as a college roommates  . High cholesterol   . Hypertension   . Melanoma ([place])    also basal cell carcinomas  . Meniere's disease of left ear   . Radiation 07/03/2015-08/16/2015   Left Mid Chest, surgical clips 66 gray  . Scoliosis deformity of spine   . Shingles    04/2014     RIGHT EYE, SCALP  . Shingles    right eye, forehead and head  . Shortness of breath dyspnea    WITH EXERTION   . Sleep related leg cramps   . Syncopal episodes    x 1 in October, 2015.     Past Surgical History:  Procedure Laterality Date  . APPENDECTOMY    . CATARACT EXTRACTION W/ INTRAOCULAR LENS  IMPLANT, BILATERAL    . COLON SURGERY     6 YRS AGO  "SNIPPED BAND BINDING INTESTINE"  . COLONOSCOPY    . HERNIA REPAIR    . MELANOMA EXCISION    . pilonadal cyst removed    . surgery for bowel obstruction    . THORACOTOMY/LOBECTOMY Left 05/21/2015   Procedure: LEFT THORACOTOMY/LEFT UPPER  LOBECTOMY;  Surgeon: Gaye Pollack, MD;  Location: [facility] OR;  Service: Thoracic;  Laterality: Left;  . TONSILLECTOMY    . VASECTOMY    . VIDEO BRONCHOSCOPY WITH ENDOBRONCHIAL NAVIGATION N/A 10/18/2014   Procedure: VIDEO BRONCHOSCOPY WITH ENDOBRONCHIAL NAVIGATION;  Surgeon: Collene Gobble, MD;  Location: [facility];  Service: Thoracic;  Laterality: N/A;   History reviewed. No pertinent family history. Social History  Substance Use Topics  . Smoking status: Former Smoker    Packs/day: 2.00    Years: 25.00    Types: Cigarettes    Quit date: 10/21/1981  . Smokeless tobacco: Never Used  . Alcohol use 12.0 oz/week    20 Standard drinks or equivalent per week     Comment: occ   Current Outpatient Prescriptions  Medication Sig Dispense Refill  . Calcium Carb-Cholecalciferol (CALCIUM 600 + D PO) Take 600 mg by mouth daily.    . carvedilol (COREG) 25 MG tablet Take 1 tablet (25 mg total) by mouth 2 (two) times daily with a meal. 60 tablet 3  . CREON 24000-76000 units CPEP     . ibuprofen (ADVIL,MOTRIN) 200 MG tablet Take 200 mg by mouth every 6 (six) hours as needed for  mild pain or moderate pain.    . Multiple Vitamin (MULTIVITAMIN WITH MINERALS) TABS tablet Take 2 tablets by mouth daily.    . predniSONE (DELTASONE) 20 MG tablet Take 60 mg by mouth See admin instructions. Reported on 09/20/2015....ONLY TAKES WHEN HEARING STARTS TO DIMINISH    . rosuvastatin (CRESTOR) 10 MG tablet Take 5 mg by mouth every Monday, Wednesday, and Friday. In AM    . telmisartan-hydrochlorothiazide (MICARDIS HCT) 40-12.5 MG tablet TAKE 1/2 TABLET DAILY. 15 tablet 10   No current facility-administered medications for this visit.    Allergies  Allergen Reactions  . Dust Mite Extract Other (See Comments)    Runny nose     Review of Systems: Hearing problems in left ear. All other systems reviewed and negative except where noted in HPI.    Physical Exam: Ht '5\' 8"'$  (1.727 m)   Wt 188 lb (85.3 kg)   BMI 28.59 kg/m    Constitutional:  Well-developed, white male in no acute distress. Psychiatric: Normal mood and affect. Behavior is normal. HEENT: Normocephalic and atraumatic. Conjunctivae are normal. No scleral icterus. Neck supple.  Cardiovascular: Normal rate, regular rhythm.  Pulmonary/chest: Effort normal and breath sounds normal. No wheezing, rales or rhonchi. Abdominal: Soft, nondistended, nontender. Bowel sounds active throughout. There are no masses palpable. No hepatomegaly. Extremities: no edema Lymphadenopathy: No cervical adenopathy noted. Neurological: Alert and oriented to person place and time. Skin: Skin is warm and dry. No rashes noted.   ASSESSMENT AND PLAN:  57. 76 year-old male with several week history of loose, non-bloody stool. No response to trial of metronidazole nor Creon. C. difficile negative. No recent medication or dietary changes. Patient has a history of lung cancer, he is somewhat anxious about these bowel changes. He has had some weight loss but has been trying to lose. It is reasonable to proceed with colonoscopy for further evaluation, his last colonoscopy was nearly 10 years ago.  -For further evaluation patient will be scheduled for a colonoscopy with possible polypectomy / biopsies.  The risks and benefits of the procedure were discussed and the patient agrees to proceed. -He may continue Kaopectate in the interim as needed but not within a few days of bowel prep  2. AAA, followed closely by thoracic surgery. Recent CT scan showed stable aneurysm  3. Hypertension, controlled  4. Coronary artery disease. No chest pain. No dyspnea.  Tye Savoy, NP  08/05/2016, 10:32 AM  Cc: Leanna Battles, MD

## 2016-08-07 NOTE — Progress Notes (Signed)
Agree with assessment and plan as outlined.  

## 2016-08-08 ENCOUNTER — Encounter: Payer: Self-pay | Admitting: Gastroenterology

## 2016-08-22 ENCOUNTER — Ambulatory Visit (AMBULATORY_SURGERY_CENTER): Payer: PPO | Admitting: Gastroenterology

## 2016-08-22 ENCOUNTER — Encounter: Payer: Self-pay | Admitting: Gastroenterology

## 2016-08-22 VITALS — BP 151/92 | HR 63 | Temp 97.3°F | Resp 17 | Ht 68.0 in | Wt 188.0 lb

## 2016-08-22 DIAGNOSIS — R197 Diarrhea, unspecified: Secondary | ICD-10-CM

## 2016-08-22 DIAGNOSIS — I2510 Atherosclerotic heart disease of native coronary artery without angina pectoris: Secondary | ICD-10-CM | POA: Diagnosis not present

## 2016-08-22 DIAGNOSIS — Z1211 Encounter for screening for malignant neoplasm of colon: Secondary | ICD-10-CM

## 2016-08-22 DIAGNOSIS — D120 Benign neoplasm of cecum: Secondary | ICD-10-CM | POA: Diagnosis not present

## 2016-08-22 DIAGNOSIS — I10 Essential (primary) hypertension: Secondary | ICD-10-CM | POA: Diagnosis not present

## 2016-08-22 MED ORDER — SODIUM CHLORIDE 0.9 % IV SOLN: 500.0000 mL | INTRAVENOUS | Status: AC

## 2016-08-22 NOTE — Progress Notes (Signed)
Called to room to assist during endoscopic procedure.  Patient ID and intended procedure confirmed with present staff. Received instructions for my participation in the procedure from the performing physician.  

## 2016-08-22 NOTE — Progress Notes (Signed)
Pt's states no medical or surgical changes since previsit or office visit. 

## 2016-08-22 NOTE — Progress Notes (Signed)
To recovery VSS report to [facility] RN

## 2016-08-22 NOTE — Op Note (Signed)
[place] Patient Name: George Montoya Procedure Date: 08/22/2016 10:21 AM MRN: [id number] Endoscopist: Remo Lipps P. Daemion Mcniel MD, MD Age: 76 Referring MD:  Date of Birth: [birth year] Gender: Male Account #: [id number] Procedure:                Colonoscopy Indications:              Clinically significant diarrhea of unexplained                            origin, last colonoscopy 10 years ago, also for                            screening purposes Medicines:                Monitored Anesthesia Care Procedure:                Pre-Anesthesia Assessment:                           - Prior to the procedure, a History and Physical                            was performed, and patient medications and                            allergies were reviewed. The patient's tolerance of                            previous anesthesia was also reviewed. The risks                            and benefits of the procedure and the sedation                            options and risks were discussed with the patient.                            All questions were answered, and informed consent                            was obtained. Prior Anticoagulants: The patient has                            taken no previous anticoagulant or antiplatelet                            agents. ASA Grade Assessment: II - A patient with                            mild systemic disease. After reviewing the risks                            and benefits, the patient was deemed in  satisfactory condition to undergo the procedure.                           After obtaining informed consent, the colonoscope                            was passed under direct vision. Throughout the                            procedure, the patient's blood pressure, pulse, and                            oxygen saturations were monitored continuously. The                            Colonoscope was introduced through the  anus and                            advanced to the the terminal ileum, with                            identification of the appendiceal orifice and IC                            valve. The colonoscopy was performed without                            difficulty. The patient tolerated the procedure                            well. The quality of the bowel preparation was                            good. The terminal ileum, ileocecal valve,                            appendiceal orifice, and rectum were photographed. Scope In: 10:30:25 AM Scope Out: 10:49:19 AM Scope Withdrawal Time: 0 hours 14 minutes 51 seconds  Total Procedure Duration: 0 hours 18 minutes 54 seconds  Findings:                 The perianal and digital rectal examinations were                            normal.                           The terminal ileum appeared normal.                           A 7 mm polyp was found in the cecum. The polyp was                            sessile. The polyp was removed with a cold snare.  Resection and retrieval were complete.                           Two medium-sized angiodysplastic lesions without                            bleeding were found in the cecum.                           Multiple medium-mouthed diverticula were found in                            the left colon.                           Internal hemorrhoids were found during retroflexion.                           The exam was otherwise without abnormality. No                            obvious inflammatory changes noted.                           Biopsies for histology were taken with a cold                            forceps from the right colon and left colon for                            evaluation of microscopic colitis. Complications:            No immediate complications. Estimated blood loss:                            Minimal. Estimated Blood Loss:     Estimated blood loss was  minimal. Impression:               - The examined portion of the ileum was normal.                           - One 7 mm polyp in the cecum, removed with a cold                            snare. Resected and retrieved.                           - Two non-bleeding colonic angiodysplastic lesions.                           - Diverticulosis in the left colon.                           - Internal hemorrhoids.                           -  The examination was otherwise normal.                           - Biopsies were taken with a cold forceps from the                            right colon and left colon for evaluation of                            microscopic colitis. Recommendation:           - Patient has a contact number available for                            emergencies. The signs and symptoms of potential                            delayed complications were discussed with the                            patient. Return to normal activities tomorrow.                            Written discharge instructions were provided to the                            patient.                           - Resume previous diet.                           - Continue present medications.                           - No ibuprofen, naproxen, or other non-steroidal                            anti-inflammatory drugs for 2 weeks after polyp                            removal.                           - Await pathology results.                           - Repeat colonoscopy is recommended for                            surveillance. The colonoscopy date will be                            determined after pathology results from today's                            exam become available for  review. Remo Lipps P. Laquitta Dominski MD, MD 08/22/2016 10:54:39 AM This report has been signed electronically.

## 2016-08-22 NOTE — Patient Instructions (Signed)
YOU HAD AN ENDOSCOPIC PROCEDURE TODAY AT [facility]:   Refer to the procedure report that was given to you for any specific questions about what was found during the examination.  If the procedure report does not answer your questions, please call your gastroenterologist to clarify.  If you requested that your care partner not be given the details of your procedure findings, then the procedure report has been included in a sealed envelope for you to review at your convenience later.  YOU SHOULD EXPECT: Some feelings of bloating in the abdomen. Passage of more gas than usual.  Walking can help get rid of the air that was put into your GI tract during the procedure and reduce the bloating. If you had a lower endoscopy (such as a colonoscopy or flexible sigmoidoscopy) you may notice spotting of blood in your stool or on the toilet paper. If you underwent a bowel prep for your procedure, you may not have a normal bowel movement for a few days.  Please Note:  You might notice some irritation and congestion in your nose or some drainage.  This is from the oxygen used during your procedure.  There is no need for concern and it should clear up in a day or so.  SYMPTOMS TO REPORT IMMEDIATELY:   Following lower endoscopy (colonoscopy or flexible sigmoidoscopy):  Excessive amounts of blood in the stool  Significant tenderness or worsening of abdominal pains  Swelling of the abdomen that is new, acute  Fever of 100F or higher    For urgent or emergent issues, a gastroenterologist can be reached at any hour by calling [phone].   DIET:  We do recommend a small meal at first, but then you may proceed to your regular diet.  Drink plenty of fluids but you should avoid alcoholic beverages for 24 hours.  ACTIVITY:  You should plan to take it easy for the rest of today and you should NOT DRIVE or use heavy machinery until tomorrow (because of the sedation medicines used during the test).     FOLLOW UP: Our staff will call the number listed on your records the next business day following your procedure to check on you and address any questions or concerns that you may have regarding the information given to you following your procedure. If we do not reach you, we will leave a message.  However, if you are feeling well and you are not experiencing any problems, there is no need to return our call.  We will assume that you have returned to your regular daily activities without incident.  If any biopsies were taken you will be contacted by phone or by letter within the next 1-3 weeks.  Please call us at [phone] if you have not heard about the biopsies in 3 weeks.    SIGNATURES/CONFIDENTIALITY: You and/or your care partner have signed paperwork which will be entered into your electronic medical record.  These signatures attest to the fact that that the information above on your After Visit Summary has been reviewed and is understood.  Full responsibility of the confidentiality of this discharge information lies with you and/or your care-partner.    INFORMATION ON POLYPS,DIVERTICULOSIS,AND HEMORRHOIDS GIVEN TO YOU TODAY  NO IBUPROFEN,NAPROXEN,OR OTHER NON STEROIDAL ANTI INFLAMMATORY PRODUCTS FOR 2 WEEKS AFTER POLYP REMOVAL  AWAIT PATHOLOGY RESULTS ON POLYPS AND BIOPSIES

## 2016-08-25 ENCOUNTER — Telehealth: Payer: Self-pay | Admitting: *Deleted

## 2016-08-25 NOTE — Telephone Encounter (Signed)
No answer left message will attempt to call back later this afternoon. SM

## 2016-08-26 ENCOUNTER — Telehealth: Payer: Self-pay | Admitting: *Deleted

## 2016-08-26 NOTE — Telephone Encounter (Signed)
Message left

## 2016-08-29 ENCOUNTER — Other Ambulatory Visit: Payer: Self-pay | Admitting: Surgery

## 2016-08-29 DIAGNOSIS — C3412 Malignant neoplasm of upper lobe, left bronchus or lung: Secondary | ICD-10-CM

## 2016-08-30 ENCOUNTER — Encounter: Payer: Self-pay | Admitting: Gastroenterology

## 2016-09-11 ENCOUNTER — Telehealth: Payer: Self-pay | Admitting: Gastroenterology

## 2016-09-11 NOTE — Telephone Encounter (Signed)
Called patient back to let him know Dr. Doyne Keel recommendations. Patient will use up rest of kaopectate and then switch to imodium to see if he can get good results with that. Patient reassured about results from colonoscopy. He understands that if he has further questions, that it's not a problem to call back and schedule an office visit.

## 2016-09-11 NOTE — Telephone Encounter (Signed)
Patient called back, he is anxious because if he does not use Kaopectate every night, then he has diarrhea the next day. As a cancer survivor he is concerned that this is masking something serious. He asked about Amyloidosis, as his brother-in-law has this and it has settled in his intestines with symptoms of diarrhea. Please advise, and patient also wanted to know if it was okay to take Kaopectate on a long term basis.

## 2016-09-11 NOTE — Telephone Encounter (Signed)
He just had a colonoscopy without any mass lesions. Biopsies of his colon were normal. I don't think we are missing anything significant. Amyloidosis is an incredibly rare disorder, and he had no evidence of this on biopsies of his colon, this seems very unlikely.  I'm assuming he is taking a bismuth product as kaopectate doesn't seem to be available in the [place] anymore, and is a bismuth product. If this is the best this that works for him it is generally safe however long term risks include neurologic symptoms and hearing loss / tinnitus, which are rare. Immodium would likely be a safer option if he has not tried taking this daily or a few tabs per day, or we can consider lomotil as well. If he wants to see me in clinic to discuss this further I am happy to if you want to make an appointment, he was seen by Nevin Bloodgood in clinic, I only saw him for his procedure. Thanks

## 2016-10-08 ENCOUNTER — Encounter: Payer: Self-pay | Admitting: Surgery

## 2016-10-08 ENCOUNTER — Ambulatory Visit (INDEPENDENT_AMBULATORY_CARE_PROVIDER_SITE_OTHER): Payer: PPO | Admitting: Surgery

## 2016-10-08 ENCOUNTER — Ambulatory Visit
Admission: RE | Admit: 2016-10-08 | Discharge: 2016-10-08 | Disposition: A | Discharge status: Home / Self Care | Payer: PPO | Source: Ambulatory Visit | Attending: Surgery | Admitting: Surgery

## 2016-10-08 VITALS — BP 181/83 | HR 62 | Resp 16 | Ht 68.0 in | Wt 188.0 lb

## 2016-10-08 DIAGNOSIS — C3412 Malignant neoplasm of upper lobe, left bronchus or lung: Secondary | ICD-10-CM | POA: Diagnosis not present

## 2016-10-08 DIAGNOSIS — I712 Thoracic aortic aneurysm, without rupture: Secondary | ICD-10-CM

## 2016-10-08 DIAGNOSIS — Z902 Acquired absence of lung [part of]: Secondary | ICD-10-CM | POA: Diagnosis not present

## 2016-10-08 DIAGNOSIS — I7121 Aneurysm of the ascending aorta, without rupture: Secondary | ICD-10-CM

## 2016-10-09 ENCOUNTER — Encounter: Payer: Self-pay | Admitting: Surgery

## 2016-10-09 NOTE — Progress Notes (Signed)
HPI:  The patient returns for follow up s/p left upper lobectomy for a pT1b, pN0 stage IA well differentiated adenocarcinoma on 05/21/2015. He did not have a fissure between the upper and lower lobes and the stapled resection margin was positive. He was not a candidate for a completion pneumonectomy and received XRT to 66 gray in 33 fractions by Dr. Sondra Come completed on 08/16/2015. His only problem with this was some radiation burns on his back that resolved. He continues to feel well and has continued to walk and play golf. He says that the only time that he has noticed some dyspnea was with walking a long distance. He has been eating well and maintaining his weight. He has minimal sputum production in the early morning when he wakes up that clears with coughing. He denies headaches, visual changes, hemoptysis, bone pain.  Current Outpatient Prescriptions  Medication Sig Dispense Refill  . Calcium Carb-Cholecalciferol (CALCIUM 600 + D PO) Take 600 mg by mouth daily.    . carvedilol (COREG) 25 MG tablet Take 1 tablet (25 mg total) by mouth 2 (two) times daily with a meal. 60 tablet 3  . ibuprofen (ADVIL,MOTRIN) 200 MG tablet Take 200 mg by mouth every 6 (six) hours as needed for mild pain or moderate pain.    . Multiple Vitamin (MULTIVITAMIN WITH MINERALS) TABS tablet Take 2 tablets by mouth daily.    . predniSONE (DELTASONE) 20 MG tablet Take 60 mg by mouth See admin instructions. Reported on 09/20/2015....ONLY TAKES WHEN HEARING STARTS TO DIMINISH    . rosuvastatin (CRESTOR) 10 MG tablet Take 5 mg by mouth every Monday, Wednesday, and Friday. In AM    . telmisartan-hydrochlorothiazide (MICARDIS HCT) 40-12.5 MG tablet TAKE 1/2 TABLET DAILY. 15 tablet 10   Current Facility-Administered Medications  Medication Dose Route Frequency Provider Last Rate Last Dose  . 0.9 %  sodium chloride infusion  500 mL Intravenous Continuous Manus Gunning, MD         Physical Exam: BP (!) 181/83 (BP  Location: Right Arm, Patient Position: Sitting, Cuff Size: Large)   Pulse 62   Resp 16   Ht '5\' 8"'$  (1.727 m)   Wt 188 lb (85.3 kg)   SpO2 98% Comment: ON RA  BMI 28.59 kg/m  He looks well There is no cervical or supraclavicular adenopathy Lungs are clear The left chest incision looks good.    Diagnostic Tests:  CLINICAL DATA:  Left upper lobe lung cancer, status post surgery and radiation, now with dyspnea  EXAM: CT CHEST WITHOUT CONTRAST  TECHNIQUE: Multidetector CT imaging of the chest was performed following the standard protocol without IV contrast.  COMPARISON:  04/09/2016  FINDINGS: Cardiovascular: Heart is normal in size. No pericardial effusion. Leftward cardiomediastinal shift.  Coronary atherosclerosis the LAD and left circumflex.  Ectasia of the ascending thoracic aorta, measuring 4.0 cm. Atherosclerotic calcifications of the aortic arch.  Mediastinum/Nodes: No suspicious mediastinal lymphadenopathy.  Visualized thyroid is unremarkable.  Lungs/Pleura: Status post left upper lobectomy.  Radiation changes in the upper aspect of the left lower lobe (series 4/ image 29).  Associated volume loss.  No suspicious pulmonary nodules.  No focal consolidation.  No pleural effusion or pneumothorax.  Upper Abdomen: Visualized upper abdomen is notable for bilateral renal cysts (hemorrhagic on the left) and moderate hepatic steatosis.  Musculoskeletal: Degenerative changes of the visualized thoracolumbar spine.  IMPRESSION: Status post left upper lobectomy with radiation changes in the left upper hemithorax.  No evidence  of recurrent or metastatic disease.  Aortic atherosclerosis.   Electronically Signed   By: Julian Hy M.D.   On: 10/08/2016 13:48   Impression:  He is doing well 17 months following surgery and XRT. I have personally reviewed and interpreted his chest CT. The chest CT shows radiation changes in the  upper portion of the remaining left lung that are stable compared to  prior CT.  There is no new mass or nodule and no adenopathy. The small ascending aortic aneurysm is 40 mm and does not require any therapy at this time.  Plan:  He will continue to follow up with Dr. Sondra Come. I will see him back in six months with a chest CT.   Gaye Pollack, MD [facility] [phone]

## 2016-11-04 IMAGING — CT NM PET TUM IMG INITIAL (PI) SKULL BASE T - THIGH
8 series · 18 of 25 positions shown · non-contrast
Comparison: None.

CLINICAL DATA: Initial treatment strategy for pulmonary nodule.

EXAM:
NUCLEAR MEDICINE PET SKULL BASE TO THIGH
TECHNIQUE: 9.8 mCi F-18 FDG was injected intravenously. Full-ring PET imaging
was performed from the skull base to thigh after the radiotracer. CT
data was obtained and used for attenuation correction and anatomic
localization.
FASTING BLOOD GLUCOSE:  Value: 79 mg/dl

[Series 3: pet sk_thigh ac · axial · 5.0mm · 4.07mm/px · z∈[-1348,-440]mm · 3 of 228 slices shown]
[im 1/228]
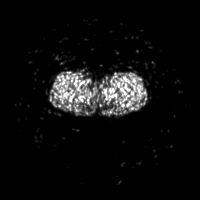
[im 152/228]
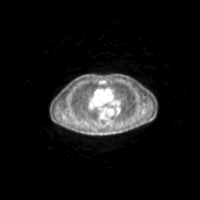
[im 228/228]
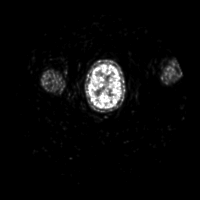

[Series 4: ct sk_thigh 5.0 b31f · axial · 5.0mm · 0.98mm/px · z∈[-1348,-440]mm · 3 of 227 slices shown]
[im 1/227]
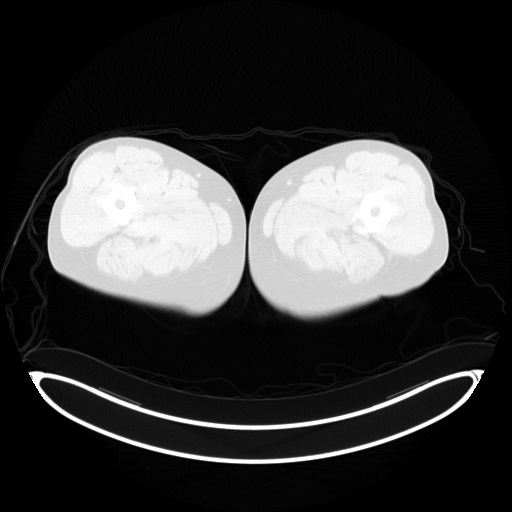
[im 151/227]
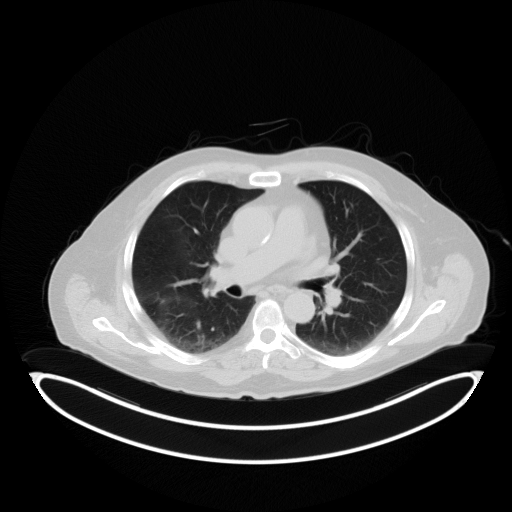
[im 227/227  brain]
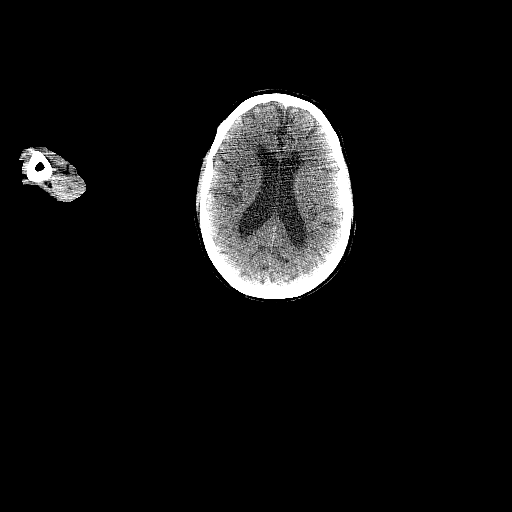

[Series 6: ct sk_thigh 5.0 b70f (id)_bone · axial · 5.0mm · 0.92mm/px · 1 of 78 slices shown]
[im 78/78  bone]
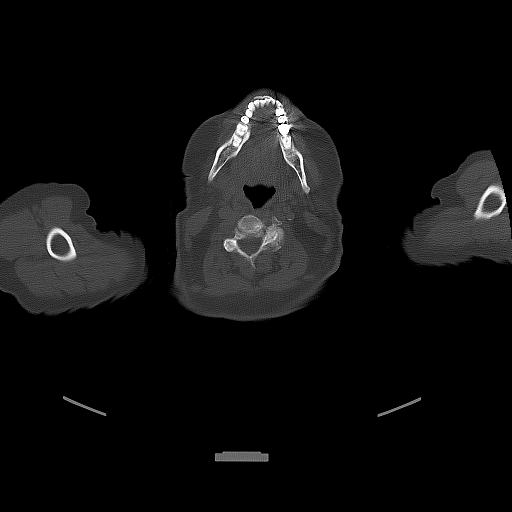

[Series 8: pet sk_thigh nac · axial · 5.0mm · 4.07mm/px · z∈[-1348,-440]mm · 4 of 228 slices shown]
[im 1/228]
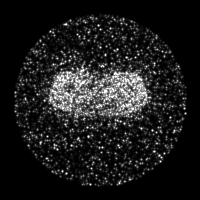
[im 57/228]
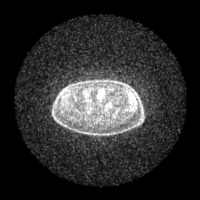
[im 171/228]
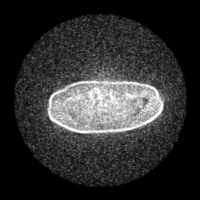
[im 228/228]
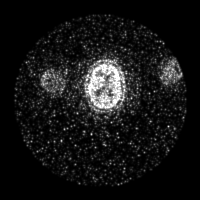

[Series 604: mip collection<mip range> · coronal · 1.88mm/px · 1 of 32 slices shown]
[im 1/32]
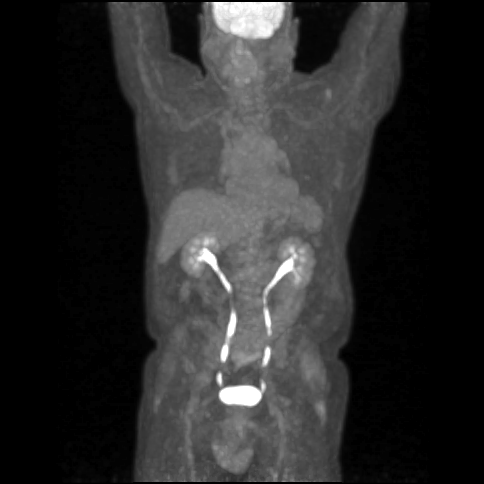

[Series 605: range-ac ct sk_thigh 5.0 hd_fov-cor-<mpr range> · coronal · 5.0mm · 0.89mm/px · 2 of 148 slices shown]
[im 74/148]
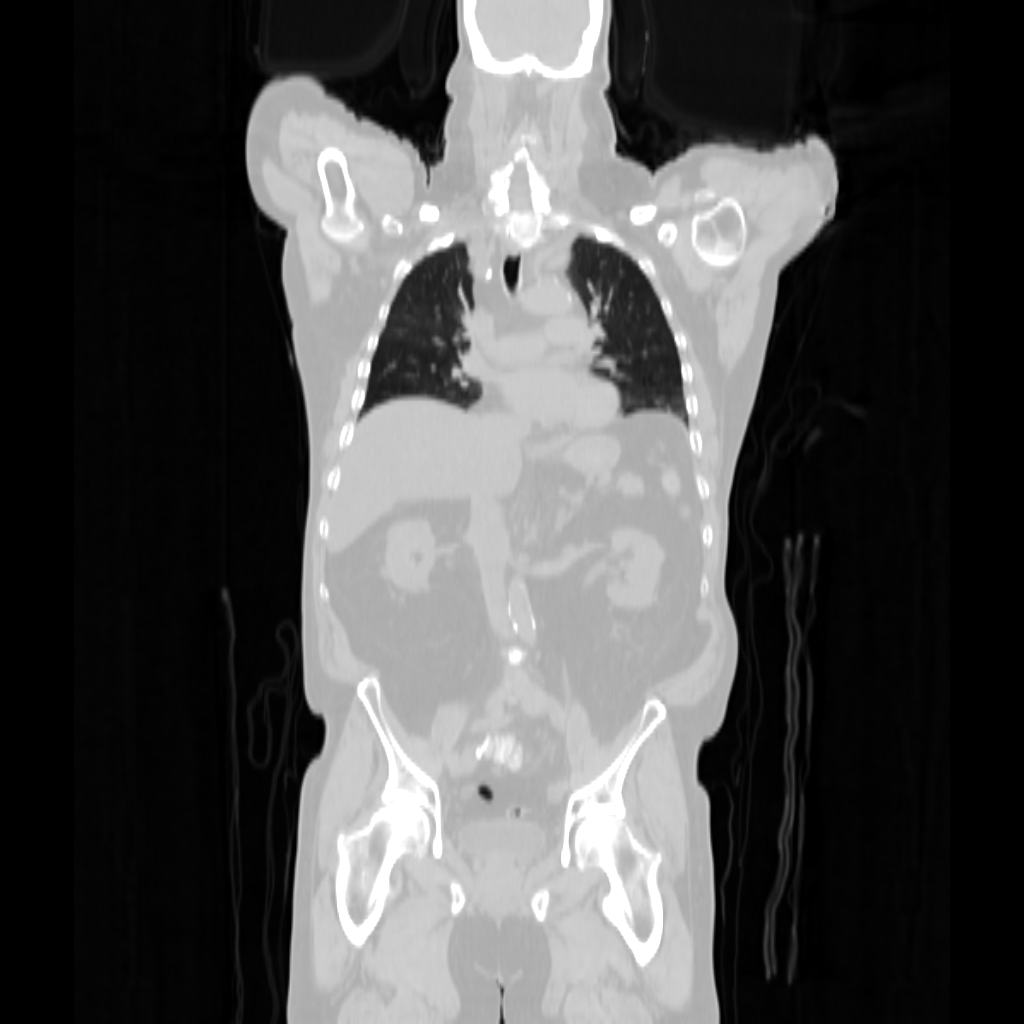
[im 148/148  full-range]
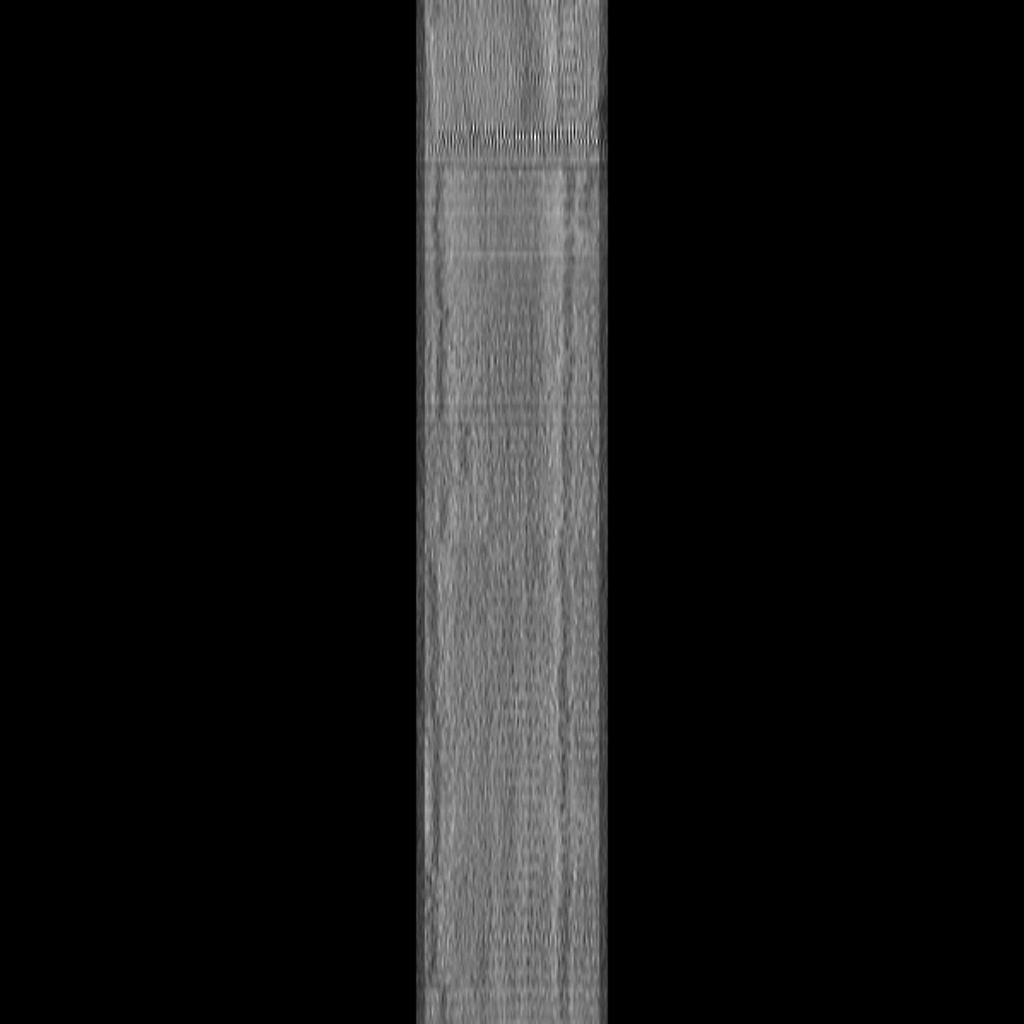

[Series 606: range-ac ct sk_thigh 5.0 hd_fov-tra-<alpha range> · 3 of 215 slices shown]
[im 54/215]
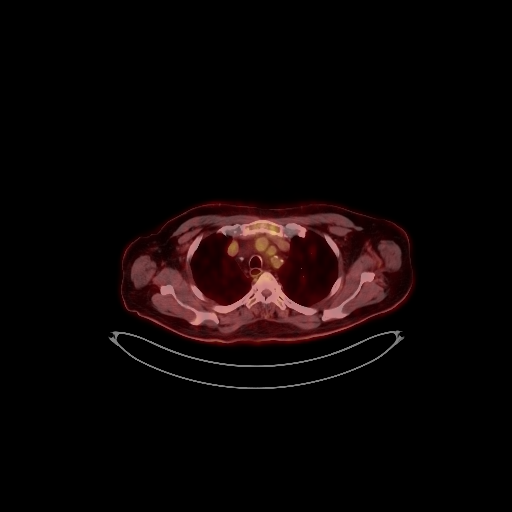
[im 108/215]
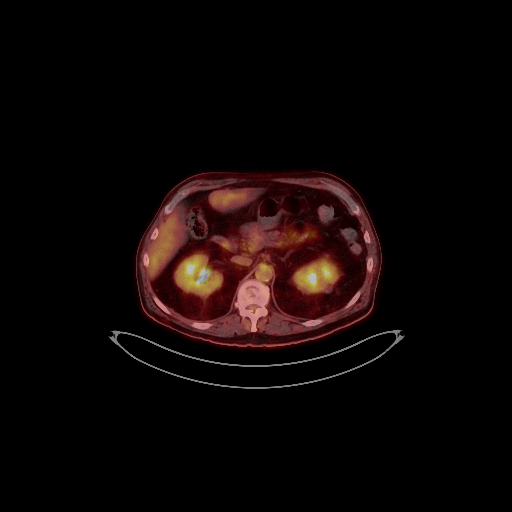
[im 161/215]
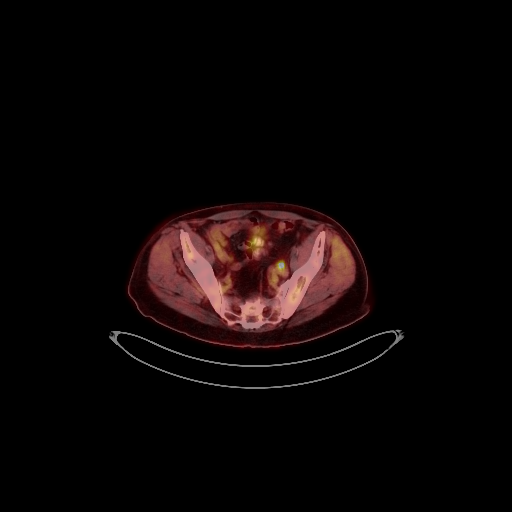

[Series 1032: results mm oncology reading · 0.89mm/px · 1 of 1 slices shown]
[im 1/1]
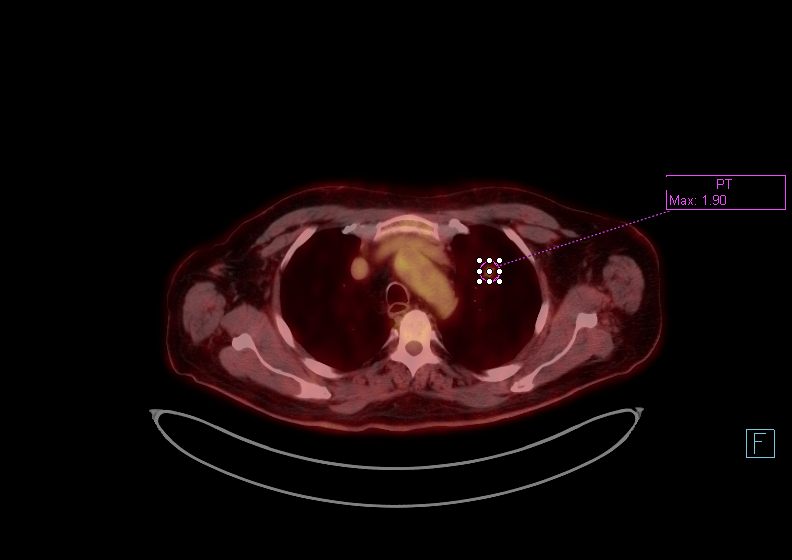

[18 of 25 positions shown; findings below may reference images not displayed]

FINDINGS: NECK

No hypermetabolic lymph nodes in the neck.

CHEST

17 x 15 mm lobulated left upper lobe lung lesion is weakly
hypermetabolic with SUV max of 1.9. This is not considered
neoplastic range uptake. However, a slow growing low grade
adenocarcinoma is possible. Recommend correlation with prior imaging
studies and either close CT followup or biopsy. No hypermetabolic or
enlarged mediastinal or hilar lymph nodes. No other pulmonary
nodules. Additional findings include tortuosity, ectasia and
calcification of the ascending aorta with aneurysmal dilatation with
maximal measurements of 4.1 x 4.0 cm. Coronary artery
calcifications.

ABDOMEN/PELVIS

No abnormal hypermetabolic activity within the liver, pancreas,
adrenal glands, or spleen. No hypermetabolic lymph nodes in the
abdomen or pelvis.

SKELETON

No focal hypermetabolic activity to suggest skeletal metastasis.
IMPRESSION: Solitary left upper lobe pulmonary nodule measuring approximately 17
x 15 mm is weakly hypermetabolic with SUV max of 1.9. Recommend
correlation with any prior imaging studies. Close CT follow-up
versus image guided biopsy.

No mediastinal or hilar adenopathy and no findings for metastatic
disease.

Three vessel coronary artery calcifications

Aneurysmal dilatation of the ascending thoracic aorta. Recommend
semi-annual imaging followup by CTA or MRA and referral to
cardiothoracic surgery if not already obtained. This recommendation
follows 5717 ACCF/AHA/AATS/ACR/ASA/SCA/SYRAH/CALEB/ARDELEAN/MCCRAY Guidelines
for the Diagnosis and Management of Patients With Thoracic Aortic
Disease. Circulation. 5717; 121: [id number]

## 2016-12-11 ENCOUNTER — Encounter: Payer: Self-pay | Admitting: Radiation Oncology

## 2016-12-11 ENCOUNTER — Ambulatory Visit
Admission: RE | Admit: 2016-12-11 | Discharge: 2016-12-11 | Disposition: A | Discharge status: Home / Self Care | Payer: PPO | Source: Ambulatory Visit | Attending: Radiation Oncology | Admitting: Radiation Oncology

## 2016-12-11 DIAGNOSIS — Z08 Encounter for follow-up examination after completed treatment for malignant neoplasm: Secondary | ICD-10-CM | POA: Insufficient documentation

## 2016-12-11 DIAGNOSIS — Z85118 Personal history of other malignant neoplasm of bronchus and lung: Secondary | ICD-10-CM | POA: Diagnosis not present

## 2016-12-11 DIAGNOSIS — Z923 Personal history of irradiation: Secondary | ICD-10-CM | POA: Insufficient documentation

## 2016-12-11 DIAGNOSIS — C3412 Malignant neoplasm of upper lobe, left bronchus or lung: Secondary | ICD-10-CM

## 2016-12-11 NOTE — Progress Notes (Signed)
Radiation Oncology         [phone]) [phone] ________________________________  Name: George Montoya. MRN: [id number]  Date: 12/11/2016  DOB: May 17, 1941  Follow-Up Visit Note  CC: Leanna Battles, MD  Gaye Pollack, MD   Diagnosis:  Stage IB Well-differentiated adenocarcinoma of the left lung with positive surgical margins   Radiation treatment dates:  16 months  07/03/2015-08/16/2015: Left mid chest, surgical clips, 66 gray in 33 fractions, post op  Narrative:  The patient returns today for routine follow-up. He denies having any pain.  [place]  He reports having a cough with clear sputum and said he feels like something is in his vocal cords.  He said his voice sometimes gets high pitched.  He denies having fatigue and is golfing 18 holes twice a week.     ALLERGIES:  is allergic to dust mite extract.  Meds: Current Outpatient Prescriptions  Medication Sig Dispense Refill  . Calcium Carb-Cholecalciferol (CALCIUM 600 + D PO) Take 600 mg by mouth daily.    . carvedilol (COREG) 25 MG tablet Take 1 tablet (25 mg total) by mouth 2 (two) times daily with a meal. 60 tablet 3  . ibuprofen (ADVIL,MOTRIN) 200 MG tablet Take 200 mg by mouth every 6 (six) hours as needed for mild pain or moderate pain.    . Multiple Vitamin (MULTIVITAMIN WITH MINERALS) TABS tablet Take 2 tablets by mouth daily.    . rosuvastatin (CRESTOR) 10 MG tablet Take 5 mg by mouth every Monday, Wednesday, and Friday. In AM    . telmisartan-hydrochlorothiazide (MICARDIS HCT) 40-12.5 MG tablet TAKE 1/2 TABLET DAILY. 15 tablet 10  . predniSONE (DELTASONE) 20 MG tablet Take 60 mg by mouth See admin instructions. Reported on 09/20/2015....ONLY TAKES WHEN HEARING STARTS TO DIMINISH     Current Facility-Administered Medications  Medication Dose Route Frequency Provider Last Rate Last Dose  . 0.9 %  sodium chloride infusion  500 mL Intravenous Continuous  Armbruster, Renelda Loma, MD        Physical Findings: The patient is in no acute distress. Patient is alert and oriented.  height is 5\' 8"  (1.727 m) and weight is 185 lb 6.4 oz (84.1 kg). His oral temperature is 98.1 F (36.7 C). His blood pressure is 152/76 (abnormal) and his pulse is 58 (abnormal). His oxygen saturation is 100%.  Lungs are clear to auscultation bilaterally. Heart has regular rate and rhythm. No palpable cervical, supraclavicular or axillary lymphoadenopathy. Left thoracotomy scar well healed without sign of recurrence in this area. Mild hyperpigmentation changes in the left upper back region.  Lab Findings: Lab Results  Component Value Date   WBC 10.0 05/22/2015   HGB 12.0 (L) 05/22/2015   HCT 34.9 (L) 05/22/2015   MCV 100.0 05/22/2015   PLT 181 05/22/2015    Radiographic Findings: No results found.  Impression:  No evidence of disease recurrence on clinical exam today.   Plan:  Pt will return for f/u in 6 months. He will see Dr. Cyndia Bent in the meantime, with CT scan. -----------------------------------  Blair Promise, PhD, MD  This document serves as a record of services personally performed by Gery Pray, MD. It was created on his behalf by Linward Natal, a trained medical scribe. The creation of this record is based on the scribe's personal observations and the provider's statements to them. This document has been checked and approved by the attending provider.

## 2016-12-11 NOTE — Progress Notes (Signed)
George Montoya is here for follow up. He denies having any pain.  [place]  He reports having a cough with clear sputum and said he feels like something is in his vocal cords.  He said his voice sometimes gets high pitched.  He denies having fatigue and is golfing 18 holes twice a week.  BP (!) 152/76 (BP Location: Left Arm, Patient Position: Sitting)   Pulse (!) 58   Temp 98.1 F (36.7 C) (Oral)   Ht 5\' 8"  (1.727 m)   Wt 185 lb 6.4 oz (84.1 kg)   SpO2 100%   BMI 28.19 kg/m    Wt Readings from Last 3 Encounters:  12/11/16 185 lb 6.4 oz (84.1 kg)  10/08/16 188 lb (85.3 kg)  08/22/16 188 lb (85.3 kg)

## 2016-12-15 DIAGNOSIS — Z85820 Personal history of malignant melanoma of skin: Secondary | ICD-10-CM | POA: Diagnosis not present

## 2016-12-15 DIAGNOSIS — L570 Actinic keratosis: Secondary | ICD-10-CM | POA: Diagnosis not present

## 2016-12-15 DIAGNOSIS — Z85828 Personal history of other malignant neoplasm of skin: Secondary | ICD-10-CM | POA: Diagnosis not present

## 2016-12-15 DIAGNOSIS — L821 Other seborrheic keratosis: Secondary | ICD-10-CM | POA: Diagnosis not present

## 2016-12-15 DIAGNOSIS — D485 Neoplasm of uncertain behavior of skin: Secondary | ICD-10-CM | POA: Diagnosis not present

## 2016-12-15 DIAGNOSIS — D1801 Hemangioma of skin and subcutaneous tissue: Secondary | ICD-10-CM | POA: Diagnosis not present

## 2016-12-18 ENCOUNTER — Ambulatory Visit: Payer: Self-pay | Admitting: Radiation Oncology

## 2017-01-21 DIAGNOSIS — H35371 Puckering of macula, right eye: Secondary | ICD-10-CM | POA: Diagnosis not present

## 2017-01-21 DIAGNOSIS — H26493 Other secondary cataract, bilateral: Secondary | ICD-10-CM | POA: Diagnosis not present

## 2017-03-13 ENCOUNTER — Other Ambulatory Visit: Payer: Self-pay | Admitting: *Deleted

## 2017-03-13 DIAGNOSIS — R918 Other nonspecific abnormal finding of lung field: Secondary | ICD-10-CM

## 2017-03-13 DIAGNOSIS — R911 Solitary pulmonary nodule: Secondary | ICD-10-CM

## 2017-04-03 DIAGNOSIS — Z125 Encounter for screening for malignant neoplasm of prostate: Secondary | ICD-10-CM | POA: Diagnosis not present

## 2017-04-03 DIAGNOSIS — Z7689 Persons encountering health services in other specified circumstances: Secondary | ICD-10-CM | POA: Diagnosis not present

## 2017-04-03 DIAGNOSIS — E7849 Other hyperlipidemia: Secondary | ICD-10-CM | POA: Diagnosis not present

## 2017-04-03 DIAGNOSIS — I10 Essential (primary) hypertension: Secondary | ICD-10-CM | POA: Diagnosis not present

## 2017-04-08 ENCOUNTER — Other Ambulatory Visit: Payer: PPO

## 2017-04-08 ENCOUNTER — Ambulatory Visit: Payer: PPO | Admitting: Surgery

## 2017-04-14 DIAGNOSIS — Z0000 Encounter for general adult medical examination without abnormal findings: Secondary | ICD-10-CM | POA: Diagnosis not present

## 2017-04-14 DIAGNOSIS — I10 Essential (primary) hypertension: Secondary | ICD-10-CM | POA: Diagnosis not present

## 2017-04-14 DIAGNOSIS — Z6828 Body mass index (BMI) 28.0-28.9, adult: Secondary | ICD-10-CM | POA: Diagnosis not present

## 2017-04-14 DIAGNOSIS — R490 Dysphonia: Secondary | ICD-10-CM | POA: Diagnosis not present

## 2017-04-14 DIAGNOSIS — E7849 Other hyperlipidemia: Secondary | ICD-10-CM | POA: Diagnosis not present

## 2017-04-14 DIAGNOSIS — H8109 Meniere's disease, unspecified ear: Secondary | ICD-10-CM | POA: Diagnosis not present

## 2017-04-14 DIAGNOSIS — Z1389 Encounter for screening for other disorder: Secondary | ICD-10-CM | POA: Diagnosis not present

## 2017-04-14 DIAGNOSIS — R197 Diarrhea, unspecified: Secondary | ICD-10-CM | POA: Diagnosis not present

## 2017-04-14 DIAGNOSIS — I2510 Atherosclerotic heart disease of native coronary artery without angina pectoris: Secondary | ICD-10-CM | POA: Diagnosis not present

## 2017-04-14 DIAGNOSIS — M545 Low back pain: Secondary | ICD-10-CM | POA: Diagnosis not present

## 2017-04-17 DIAGNOSIS — Z1212 Encounter for screening for malignant neoplasm of rectum: Secondary | ICD-10-CM | POA: Diagnosis not present

## 2017-04-24 DIAGNOSIS — H8102 Meniere's disease, left ear: Secondary | ICD-10-CM | POA: Diagnosis not present

## 2017-04-24 DIAGNOSIS — H903 Sensorineural hearing loss, bilateral: Secondary | ICD-10-CM | POA: Diagnosis not present

## 2017-04-29 ENCOUNTER — Encounter: Payer: Self-pay | Admitting: Surgery

## 2017-04-29 ENCOUNTER — Ambulatory Visit: Payer: PPO | Admitting: Surgery

## 2017-04-29 ENCOUNTER — Ambulatory Visit
Admission: RE | Admit: 2017-04-29 | Discharge: 2017-04-29 | Disposition: A | Discharge status: Home / Self Care | Payer: PPO | Source: Ambulatory Visit | Attending: Surgery | Admitting: Surgery

## 2017-04-29 ENCOUNTER — Other Ambulatory Visit: Payer: Self-pay

## 2017-04-29 VITALS — BP 183/94 | HR 58 | Ht 68.0 in | Wt 177.0 lb

## 2017-04-29 DIAGNOSIS — C3412 Malignant neoplasm of upper lobe, left bronchus or lung: Secondary | ICD-10-CM

## 2017-04-29 DIAGNOSIS — R911 Solitary pulmonary nodule: Secondary | ICD-10-CM

## 2017-04-29 DIAGNOSIS — R918 Other nonspecific abnormal finding of lung field: Secondary | ICD-10-CM

## 2017-05-03 ENCOUNTER — Encounter: Payer: Self-pay | Admitting: Surgery

## 2017-05-03 NOTE — Progress Notes (Signed)
HPI:  The patient returns for follow up s/p left upper lobectomy for a pT1b, pN0 stage IA well differentiated adenocarcinoma on 05/21/2015. He did not have a fissure between the upper and lower lobes and the stapled resection margin was positive. He was not a candidate for a completion pneumonectomy and received XRT to 66 gray in 33 fractions by Dr. Sondra Come completed on 08/16/2015. He continues to feel well and has continued to walk and play golf. He says that the only time that he has noticed some dyspnea was with walking a long distance.He has been eating well and maintaining his weight. He has minimal sputum production in the early morning when he wakes up that clears with coughing. He feels that his voice gets weak at times and he sounds like he has a cold. He coughs and it usually clears up, like he has something on his vocal cords. He is scheduled to see an ENT physician soon. He denies headaches, visual changes, hemoptysis, bonepain.   Current Outpatient Medications  Medication Sig Dispense Refill  . Calcium Carb-Cholecalciferol (CALCIUM 600 + D PO) Take 600 mg by mouth daily.    . carvedilol (COREG) 25 MG tablet Take 1 tablet (25 mg total) by mouth 2 (two) times daily with a meal. 60 tablet 3  . ibuprofen (ADVIL,MOTRIN) 200 MG tablet Take 200 mg by mouth every 6 (six) hours as needed for mild pain or moderate pain.    . Multiple Vitamin (MULTIVITAMIN WITH MINERALS) TABS tablet Take 2 tablets by mouth daily.    . potassium chloride SA (K-DUR,KLOR-CON) 20 MEQ tablet     . rosuvastatin (CRESTOR) 10 MG tablet Take 5 mg by mouth every Monday, Wednesday, and Friday. In AM    . telmisartan-hydrochlorothiazide (MICARDIS HCT) 40-12.5 MG tablet TAKE 1/2 TABLET DAILY. 15 tablet 10   Current Facility-Administered Medications  Medication Dose Route Frequency Provider Last Rate Last Dose  . 0.9 %  sodium chloride infusion  500 mL Intravenous Continuous Armbruster, Carlota Raspberry, MD         Physical  Exam: BP (!) 183/94   Pulse (!) 58   Ht 5\' 8"  (1.727 m)   Wt 177 lb (80.3 kg)   SpO2 99%   BMI 26.91 kg/m  He looks well There is no cervical or supraclavicular adenopathy Lungs are clear The left chest incision looks good.    Diagnostic Tests:  CLINICAL DATA:  Followup left upper lobe lung carcinoma. Status post surgery and radiation therapy.  EXAM: CT CHEST WITHOUT CONTRAST  TECHNIQUE: Multidetector CT imaging of the chest was performed following the standard protocol without IV contrast.  COMPARISON:  10/08/2016 and 04/09/2016  FINDINGS: Cardiovascular: No acute findings. Aortic and coronary artery atherosclerosis.  Mediastinum/Nodes: No masses or pathologically enlarged lymph nodes identified on this unenhanced exam.  Lungs/Pleura: Stable postop changes from left upper lobectomy. Stable radiation fibrosis with traction bronchiectasis in left upper lung field. Tiny sub-cm nodules in the inferior right middle lobe and anterior right lower lobe remains stable. No new or enlarging pulmonary nodules or masses identified. No evidence of pulmonary infiltrate or pleural effusion.  Upper Abdomen: Normal adrenal glands. Stable hepatic steatosis. Stable cysts in visualized upper poles of both kidneys.  Musculoskeletal:  No suspicious bone lesions.  IMPRESSION: Stable postop changes and radiation fibrosis in left lung. No evidence of recurrent or metastatic carcinoma, or other acute findings.  Aortic Atherosclerosis (ICD10-I70.0). Coronary artery calcification.   Electronically Signed   By: Jenny Reichmann  Kris Hartmann M.D.   On: 04/29/2017 11:52   Impression:  He continues to do well overall with no evidence of recurrent cancer on chest CT. I reviewed the CT images with him and answered his questions.  His only complaint is of his raspy voice at times and it is not clear if that is due to some vocal cord dysfunction or possibly post-nasal drainage. He will follow  up with an ENT physician for vocal cord evaluation.  Plan:  I will see him back in 6 months with a CT of the chest.  I spent 15 minutes performing this established patient evaluation and > 50% of this time was spent face to face counseling and coordinating the care of this patient's lung cancer.  Gaye Pollack, MD [facility] [phone]

## 2017-05-07 DIAGNOSIS — J3801 Paralysis of vocal cords and larynx, unilateral: Secondary | ICD-10-CM | POA: Diagnosis not present

## 2017-05-07 DIAGNOSIS — R490 Dysphonia: Secondary | ICD-10-CM | POA: Diagnosis not present

## 2017-05-21 ENCOUNTER — Ambulatory Visit: Payer: PPO | Attending: Otolaryngology | Admitting: Speech Pathology

## 2017-05-21 ENCOUNTER — Other Ambulatory Visit: Payer: Self-pay

## 2017-05-21 DIAGNOSIS — R498 Other voice and resonance disorders: Secondary | ICD-10-CM | POA: Diagnosis not present

## 2017-05-21 NOTE — Patient Instructions (Signed)
VOCAL FOLD ADDUCTION EXERCISES  Breathe before each excercise  Yawn-Sigh  Take an easy breath through your mouth while yawning gently. When you breathe out, say an easy, prolonged "AH" 10x, 3x a day  1.  PUSH Bear down against a chair while saying /a/ 5 times, with a lot of effort  2.  PULL up of the seat of a chair with both hands while saying /a/ 5 times with a lot of   Effort  3. Take a breath, hold it for 3 seconds with your mouth open, then release it with a loud exhale. Think of an army or football "HUH." You are using your voice box to hold in your breath.  4. Glide your pitch high and low (as low and high as possible) "Knoll"  5. Glide your pitch low to high "Knoll"  6. Gentle cough/throat clear, then continue voicing for 3-5 seconds  7. Hold your breath, swallow hard, then immediately cough   Do these 10 times each, 3 times a day  Provided by: Deneise Lever, MS, CCC-SLP

## 2017-05-22 ENCOUNTER — Encounter: Payer: Self-pay | Admitting: Speech Pathology

## 2017-05-22 NOTE — Therapy (Signed)
[facility] [address] [place], [place], [address] Phone: [phone]   Fax:  [phone]  Speech Language Pathology Evaluation  Patient Details  Name: George Montoya. MRN: [id number] Date of Birth: 05-06-1941 Referring Provider: Dr. Hessie Knows   Encounter Date: 05/21/2017  End of Session - 05/22/17 0728    Visit Number  1    Number of Visits  17    Date for SLP Re-Evaluation  07/24/17    SLP Start Time  1448    SLP Stop Time   1532    SLP Time Calculation (min)  44 min    Activity Tolerance  Patient tolerated treatment well       Past Medical History:  Diagnosis Date  . Aortic aneurysm (HCC)    DR BARTLE    JUST WATCHING   . Arthritis    spine  . Bowel obstruction ([place])   . GERD (gastroesophageal reflux disease)   . Headache    hx of 2 migraines as a college roommates  . High cholesterol   . Hypertension   . Melanoma ([place])    also basal cell carcinomas  . Meniere's disease of left ear   . Radiation 07/03/2015-08/16/2015   Left Mid Chest, surgical clips 66 gray  . Scoliosis deformity of spine   . Shingles    04/2014     RIGHT EYE, SCALP  . Shingles    right eye, forehead and head  . Shortness of breath dyspnea    WITH EXERTION   . Sleep related leg cramps   . Syncopal episodes    x 1 in October, 2015.    Past Surgical History:  Procedure Laterality Date  . APPENDECTOMY    . CATARACT EXTRACTION W/ INTRAOCULAR LENS  IMPLANT, BILATERAL    . COLON SURGERY     6 YRS AGO  "SNIPPED BAND BINDING INTESTINE"  . COLONOSCOPY    . HERNIA REPAIR    . MELANOMA EXCISION    . pilonadal cyst removed    . surgery for bowel obstruction    . THORACOTOMY/LOBECTOMY Left 05/21/2015   Procedure: LEFT THORACOTOMY/LEFT UPPER LOBECTOMY;  Surgeon: Gaye Pollack, MD;  Location: [facility];  Service: Thoracic;  Laterality: Left;  . TONSILLECTOMY    . VASECTOMY    . VIDEO BRONCHOSCOPY WITH ENDOBRONCHIAL  NAVIGATION N/A 10/18/2014   Procedure: VIDEO BRONCHOSCOPY WITH ENDOBRONCHIAL NAVIGATION;  Surgeon: Collene Gobble, MD;  Location: [facility];  Service: Thoracic;  Laterality: N/A;    There were no vitals filed for this visit.  Subjective Assessment - 05/21/17 1452    Subjective  "Can you hear, it's a little gravelly... everyone keeps thinking I'm sick."    Currently in Pain?  No/denies         SLP Evaluation [facility] - 05/21/17 1452      SLP Visit Information   SLP Received On  05/21/17    Referring Provider  Dr. Hessie Knows    Onset Date  05/07/17    Medical Diagnosis  paresis of left vocal cord      General Information   HPI  Pt is a 76 year old male with history of GERD, lung cancer s/p chemo, radiation of left lung (comp. 08/16/2015) and left upper lobectomy, Meniere's disease of left ear. Per Care Everywhere, pt has a history of left frontal meningioma. Referred by Dr. Redmond Baseman for left vocal fold paresis. Per laryngoscopy: "thin vocal cords and decreased  movement of the left vocal fold. The thin appearance represents atrophy mostly related to the aging process. Left-sided vocal fold weakness is likely a consequence of his left lung cancer treatment."    Behavioral/Cognition  alert, cooperative    Mobility Status  ambulated to session      Prior Functional Status   Cognitive/Linguistic Baseline  Within functional limits    Type of Home  House    Vocation  Full time employment      Cognition   Overall Cognitive Status  Within Functional Limits for tasks assessed      Auditory Comprehension   Overall Auditory Comprehension  Appears within functional limits for tasks assessed      Visual Recognition/Discrimination   Discrimination  Not tested      Reading Comprehension   Reading Status  Not tested      Expression   Primary Mode of Expression  Verbal      Verbal Expression   Overall Verbal Expression  Appears within functional limits for tasks assessed      Written  Expression   Dominant Hand  Right    Written Expression  Not tested      Oral Motor/Sensory Function   Overall Oral Motor/Sensory Function  Appears within functional limits for tasks assessed    Labial ROM  Within Functional Limits    Labial Symmetry  Within Functional Limits    Labial Strength  Within Functional Limits    Labial Sensation  Within Functional Limits    Labial Coordination  WFL    Lingual ROM  Within Functional Limits    Lingual Symmetry  Within Functional Limits    Lingual Strength  Within Functional Limits    Lingual Sensation  Within Functional Limits    Lingual Coordination  WFL    Facial ROM  Within Functional Limits    Facial Symmetry  Within Functional Limits    Facial Strength  Within Functional Limits    Facial Sensation  Within Functional Limits    Facial Coordination  WFL    Velum  Within Functional Limits    Mandible  Within Functional Limits      Motor Speech   Overall Motor Speech  Impaired    Respiration  Impaired pt had partial lobectomy in 2016    Level of Impairment  Sentence    Phonation  Hoarse;Low vocal intensity Gravelly    Resonance  Within functional limits    Articulation  Within functional limitis    Intelligibility  Intelligible    Motor Planning  Witnin functional limits    Effective Techniques  Increased vocal intensity    Phonation  Impaired    Volume  Soft    Pitch  High      Standardized Assessments   Standardized Assessments   Other Assessment        Pt presents with hoarse, raspy vocal quality. Sustained vowel /a/ measured 18.2 seconds average 62 dB at 30 cm. Paragraph reading avg 67dB (reduced). He drinks 60-70 oz water/day, 16 oz caffeine. Denies vocal abuses. Of note, he has decreased lung capacity as he is s/p partial left lobectomy. VHI score 20; pt reports decreased quality of life but feels it is a relatively "mild" problem compared to his cancer. He reports noting onset of "gravelly" voice about 6 months ago, but  states his wife says it's been "a lot longer than that." SLP guided pt through a period of diagnostic therapy for abdominal breathing and pt was approx  85% successful at obtaining AB at rest. This took approximately 5 minutes and required moderate verbal/tactile cues to relax shoulders. Pt stimulable for loud /a/ ranging from upper 70s to low 80s with improved vocal quality noted. Trained pt in 3 exercises for vocal fold adduction to begin for home exercise.          SLP Education - 05/22/17 [phone]    Education provided  Yes    Education Details  typical course of therapy, treatment approach, exercises for vocal fold adduction    Person(s) Educated  Patient    Methods  Explanation    Comprehension  Verbalized understanding       SLP Short Term Goals - 05/21/17 1448      SLP SHORT TERM GOAL #1   Title  pt will demo HEP for vocal fold adduction, abdominal breathing with rare min A over 3 sessions    Time  4    Period  Weeks    Status  New      SLP SHORT TERM GOAL #2   Title  pt will respond with at least 20 sentences average low-70s dB over three sessions     Time  4    Period  Weeks    Status  New      SLP SHORT TERM GOAL #3   Title  pt will demo abdominal breathing 95% of the time with rare min A in sentence responses    Time  4    Period  Weeks    Status  New      SLP SHORT TERM GOAL #4   Title  pt will attain WNL vocal quality in >90% of 5 minutes simple conversation     Time  4    Period  Weeks    Status  New       SLP Long Term Goals - 05/22/17 0800      SLP LONG TERM GOAL #1   Title  pt will demo HEP for vocal fold adduction, abdominal breathing with modified independence over 3 sessions    Time  8    Period  Weeks    Status  New      SLP LONG TERM GOAL #2   Title  pt will talk in simple to mod complex conversation outside [place] room with WNL loudness and vocal quality over three sessions     Time  8    Period  Weeks    Status  New      SLP LONG TERM GOAL  #3   Title  pt will demo abdominal breathing with min A rarely in 15 minutes conversation     Time  8    Period  Weeks    Status  New       Plan - 05/21/17 1448    Clinical Impression Statement  Mr. Gola presents with moderate dysphonia secondary to vocal fold atrophy and left vocal fold paresis. This results in reduced vocal intensity in conversation as well as a hoarse, raspy, vocal quality. He reports decreased quality of life, including talking less in a group, straining to produce voice in noisier environments, and friends thinking he was sick because of his voice. 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 Pt would benefit from skilled ST to address dysphonia in order to improve vocal quality and intelligibility to meet vocal demands at work and for quality of life.     Speech Therapy Frequency  2x /  week    Duration  -- 8 weeks or 16 additional visits    Treatment/Interventions  Compensatory techniques;Cueing hierarchy;Functional tasks;SLP instruction and feedback;Patient/family education;Other (comment) vocal strengthening    Potential to Achieve Goals  Good    SLP Home Exercise Plan  vocal cord adduction exercises provided    Consulted and Agree with Plan of Care  Patient       Patient will benefit from skilled therapeutic intervention in order to improve the following deficits and impairments:   Other voice and resonance disorders  G-Codes - 2017-05-29 0804    Functional Assessment Tool Used  NOMS, skilled clinical judgment    Functional Limitations  Voice    Voice Current Status (G9171)  At least 20 percent but less than 40 percent impaired, limited or restricted    Voice Goal Status (G9172)  At least 1 percent but less than 20 percent impaired, limited or restricted       Problem List Patient Active Problem List   Diagnosis Date Noted  . Primary cancer of left upper lobe of lung ([place]) 05/31/2015  . S/P lobectomy of lung 05/21/2015  . Coronary artery  calcification seen on CAT scan 05/14/2015  . Preoperative cardiovascular examination 05/14/2015  . Hyperlipidemia 05/14/2015  . Nodule of left lung 10/02/2014  . Stopped smoking with greater than 40 pack year history 10/02/2014  . Syncope 03/21/2014  . HTN (hypertension) 03/21/2014    Deneise Lever, [place], [place] 05/29/2017, 8:04 AM  [facility] [address] [place], [place], [address] Phone: [phone]   Fax:  [phone]  Name: George Montoya. MRN: [id number] Date of Birth: [birth year]

## 2017-05-25 ENCOUNTER — Telehealth: Payer: Self-pay | Admitting: Oncology

## 2017-05-25 NOTE — Telephone Encounter (Signed)
Patient left a message saying that he has an appointment with Dr. Sondra Come on 12/17 which is only a month out since his appointment with Dr. Cyndia Bent on 04/29/17.  He is wondering if this needs to be moved out.

## 2017-05-26 ENCOUNTER — Ambulatory Visit: Payer: PPO | Attending: Otolaryngology | Admitting: Speech Pathology

## 2017-05-26 ENCOUNTER — Telehealth: Payer: Self-pay | Admitting: *Deleted

## 2017-05-26 ENCOUNTER — Other Ambulatory Visit: Payer: Self-pay

## 2017-05-26 ENCOUNTER — Encounter: Payer: Self-pay | Admitting: Speech Pathology

## 2017-05-26 DIAGNOSIS — R498 Other voice and resonance disorders: Secondary | ICD-10-CM | POA: Insufficient documentation

## 2017-05-26 NOTE — Patient Instructions (Signed)
  VOCAL FOLD ADDUCTION EXERCISES  Breathe before each excercise  Yawn-Sigh  Take an easy breath through your mouth while yawning gently. When you breathe out, say an easy, prolonged "AH" 10x, 3x a day  1.  PUSH Bear down against a chair while saying /ha/ 5 times, with a lot of  - 3x a day  2.  PULL up of the seat of a chair with both hands while saying /ha/ 5 times with a lot of Effort  3. Take a breath, say "HUH" with a strong cut off, hold it for 5 seconds with your mouth open, then release it with a loud exhale. Think of an army or football "HUH."  You are using your voice box to hold in your breath. 10x 3x a day  4. Glide your pitch low to high then high to low (as low and high as possible) "No"   5. Gentle cough/throat clear, then continue voicing for 3-5 seconds - voice should not sound strained, gentle voice 5x 3x a day  6. Swallow-cough  swallow hard, then release your swallow into a cough   Do these 10 times each, 3 times a day    Tips to help you hear and help others understand you:  Get the persons attention before you speak  Use eye contact and face the person you are speaking to  Be in close proximity to the person you are speaking to  Turn down any noise in the environment such as the TV, walk away from loud appliances, air conditioners, fans, dish washers etc    Provided by: Barnabas Lister [place], [phone]

## 2017-05-26 NOTE — Telephone Encounter (Signed)
CALLED PATIENT TO INFORM OF FU APPT. BEING MOVED TO 08-13-17 @ 11:30 AM, LVM FOR A RETURN CALL

## 2017-05-26 NOTE — Therapy (Signed)
[facility] [address] [place], [place], [address] Phone: [phone]   Fax:  [phone]  Speech Language Pathology Treatment  Patient Details  Name: George Montoya. MRN: [id number] Date of Birth: 16-Jul-1940 Referring Provider: Dr. Hessie Knows   Encounter Date: 05/26/2017  End of Session - 05/26/17 1214    Visit Number  2    Number of Visits  17    Date for SLP Re-Evaluation  07/24/17    SLP Start Time  0932    SLP Stop Time   0263    SLP Time Calculation (min)  43 min    Activity Tolerance  Patient tolerated treatment well       Past Medical History:  Diagnosis Date  . Aortic aneurysm (HCC)    DR BARTLE    JUST WATCHING   . Arthritis    spine  . Bowel obstruction ([place])   . GERD (gastroesophageal reflux disease)   . Headache    hx of 2 migraines as a college roommates  . High cholesterol   . Hypertension   . Melanoma ([place])    also basal cell carcinomas  . Meniere's disease of left ear   . Radiation 07/03/2015-08/16/2015   Left Mid Chest, surgical clips 66 gray  . Scoliosis deformity of spine   . Shingles    04/2014     RIGHT EYE, SCALP  . Shingles    right eye, forehead and head  . Shortness of breath dyspnea    WITH EXERTION   . Sleep related leg cramps   . Syncopal episodes    x 1 in October, 2015.    Past Surgical History:  Procedure Laterality Date  . APPENDECTOMY    . CATARACT EXTRACTION W/ INTRAOCULAR LENS  IMPLANT, BILATERAL    . COLON SURGERY     6 YRS AGO  "SNIPPED BAND BINDING INTESTINE"  . COLONOSCOPY    . HERNIA REPAIR    . MELANOMA EXCISION    . pilonadal cyst removed    . surgery for bowel obstruction    . THORACOTOMY/LOBECTOMY Left 05/21/2015   Procedure: LEFT THORACOTOMY/LEFT UPPER LOBECTOMY;  Surgeon: Gaye Pollack, MD;  Location: [facility];  Service: Thoracic;  Laterality: Left;  . TONSILLECTOMY    . VASECTOMY    . VIDEO BRONCHOSCOPY WITH ENDOBRONCHIAL  NAVIGATION N/A 10/18/2014   Procedure: VIDEO BRONCHOSCOPY WITH ENDOBRONCHIAL NAVIGATION;  Surgeon: Collene Gobble, MD;  Location: [facility];  Service: Thoracic;  Laterality: N/A;    There were no vitals filed for this visit.         ADULT SLP TREATMENT - 05/26/17 1205      General Information   Behavior/Cognition  Alert;Cooperative;Pleasant mood      Treatment Provided   Treatment provided  Cognitive-Linquistic      Pain Assessment   Pain Assessment  No/denies pain      Cognitive-Linquistic Treatment   Treatment focused on  Voice    Skilled Treatment  Reviewed 1st 3 exercises on HEP for voice. Pt completed sustained /a/ with mod I/ Push and pull phonation with prolonged /a/ resulted in harsh, strained voice. I instructed pt to use short "HA" for these exercises. I added laryngeal breath hold, pitch glides, supraglottic swallow to HEP with occasional min to mod A. Educated pt in rationale for each exercises per his request.       Assessment / Recommendations / Plan   Plan  Continue with current  plan of care      Progression Toward Goals   Progression toward goals  Progressing toward goals       SLP Education - 05/26/17 1210    Education provided  Yes    Education Details  HEP, environmental compensations to improve intelligiliblity, rationale for exercises.    Person(s) Educated  Patient    Methods  Explanation;Demonstration;Verbal cues    Comprehension  Verbalized understanding;Returned demonstration;Verbal cues required;Need further instruction       SLP Short Term Goals - 05/26/17 1213      SLP [place] #1   Title  pt will demo HEP for vocal fold adduction, abdominal breathing with rare min A over 3 sessions    Time  4    Period  Weeks    Status  On-going      SLP SHORT TERM GOAL #2   Title  pt will respond with at least 20 sentences average low-70s dB over three sessions     Time  4    Period  Weeks    Status  On-going      SLP SHORT TERM GOAL #3    Title  pt will demo abdominal breathing 95% of the time with rare min A in sentence responses    Time  4    Period  Weeks    Status  On-going      SLP SHORT TERM GOAL #4   Title  pt will attain WNL vocal quality in >90% of 5 minutes simple conversation     Time  4    Period  Weeks    Status  On-going       SLP Long Term Goals - 05/26/17 1213      SLP LONG TERM GOAL #1   Title  pt will demo HEP for vocal fold adduction, abdominal breathing with modified independence over 3 sessions    Time  8    Period  Weeks    Status  Not Met      SLP LONG TERM GOAL #2   Title  pt will talk in simple to mod complex conversation outside [place] room with WNL loudness and vocal quality over three sessions     Time  8    Period  Weeks    Status  Not Met      SLP LONG TERM GOAL #3   Title  pt will demo abdominal breathing with min A rarely in 15 minutes conversation     Time  8    Period  Weeks      SLP LONG TERM GOAL #4   Period  Weeks       Plan - 05/26/17 1211    Clinical Impression Statement  Pt continues to present with moderate dysphonia. HEP modified and added exercises for vocal fold adducation with occasional min A. Intellgibility continues to be affected. Continue skilled ST to maximize vocal quality and intelligibility for work and QOL    Speech Therapy Frequency  2x / week    Treatment/Interventions  Compensatory techniques;Cueing hierarchy;Functional tasks;SLP instruction and feedback;Patient/family education;Other (comment)    Potential to Achieve Goals  Good    SLP Home Exercise Plan  vocal cord adduction exercises provided       Patient will benefit from skilled therapeutic intervention in order to improve the following deficits and impairments:   Other voice and resonance disorders    Problem List Patient Active Problem List   Diagnosis Date Noted  .  Primary cancer of left upper lobe of lung ([place]) 05/31/2015  . S/P lobectomy of lung 05/21/2015  . Coronary artery  calcification seen on CAT scan 05/14/2015  . Preoperative cardiovascular examination 05/14/2015  . Hyperlipidemia 05/14/2015  . Nodule of left lung 10/02/2014  . Stopped smoking with greater than 40 pack year history 10/02/2014  . Syncope 03/21/2014  . HTN (hypertension) 03/21/2014    Cindie Rajagopalan, Annye Rusk MS, [place] 05/26/2017, 12:14 PM  [facility] [address] [place], [place], [address] Phone: [phone]   Fax:  [phone]   Name: George Montoya. MRN: [id number] Date of Birth: 1941-04-23

## 2017-05-28 ENCOUNTER — Other Ambulatory Visit: Payer: Self-pay

## 2017-05-28 ENCOUNTER — Ambulatory Visit: Payer: PPO | Admitting: Speech Pathology

## 2017-05-28 ENCOUNTER — Encounter: Payer: Self-pay | Admitting: Speech Pathology

## 2017-05-28 DIAGNOSIS — R498 Other voice and resonance disorders: Secondary | ICD-10-CM | POA: Diagnosis not present

## 2017-05-28 NOTE — Therapy (Signed)
[facility] [address] [place], [place], [address] Phone: [phone]   Fax:  [phone]  Speech Language Pathology Treatment  Patient Details  Name: George Montoya. MRN: [id number] Date of Birth: 1941/06/19 Referring Provider: Dr. Hessie Knows   Encounter Date: 05/28/2017  End of Session - 05/28/17 1638    Visit Number  3    Number of Visits  17    Date for SLP Re-Evaluation  07/24/17    SLP Start Time  1532    SLP Stop Time   1415    SLP Time Calculation (min)  1363 min    Activity Tolerance  Patient tolerated treatment well       Past Medical History:  Diagnosis Date  . Aortic aneurysm (HCC)    DR BARTLE    JUST WATCHING   . Arthritis    spine  . Bowel obstruction ([place])   . GERD (gastroesophageal reflux disease)   . Headache    hx of 2 migraines as a college roommates  . High cholesterol   . Hypertension   . Melanoma ([place])    also basal cell carcinomas  . Meniere's disease of left ear   . Radiation 07/03/2015-08/16/2015   Left Mid Chest, surgical clips 66 gray  . Scoliosis deformity of spine   . Shingles    04/2014     RIGHT EYE, SCALP  . Shingles    right eye, forehead and head  . Shortness of breath dyspnea    WITH EXERTION   . Sleep related leg cramps   . Syncopal episodes    x 1 in October, 2015.    Past Surgical History:  Procedure Laterality Date  . APPENDECTOMY    . CATARACT EXTRACTION W/ INTRAOCULAR LENS  IMPLANT, BILATERAL    . COLON SURGERY     6 YRS AGO  "SNIPPED BAND BINDING INTESTINE"  . COLONOSCOPY    . HERNIA REPAIR    . MELANOMA EXCISION    . pilonadal cyst removed    . surgery for bowel obstruction    . THORACOTOMY/LOBECTOMY Left 05/21/2015   Procedure: LEFT THORACOTOMY/LEFT UPPER LOBECTOMY;  Surgeon: Gaye Pollack, MD;  [facility]: [facility];  Service: Thoracic;  Laterality: Left;  . TONSILLECTOMY    . VASECTOMY    . VIDEO BRONCHOSCOPY WITH ENDOBRONCHIAL  NAVIGATION N/A 10/18/2014   Procedure: VIDEO BRONCHOSCOPY WITH ENDOBRONCHIAL NAVIGATION;  Surgeon: Collene Gobble, MD;  Location: [facility];  Service: Thoracic;  Laterality: N/A;    There were no vitals filed for this visit.  Subjective Assessment - 05/28/17 1538    Subjective  "I think I'm doing these correctly."    Currently in Pain?  No/denies            ADULT SLP TREATMENT - 05/28/17 1539      General Information   Behavior/Cognition  Alert;Cooperative;Pleasant mood    Patient Positioning  Upright in chair      Treatment Provided   Treatment provided  Cognitive-Linquistic      Pain Assessment   Pain Assessment  No/denies pain      Cognitive-Linquistic Treatment   Treatment focused on  Voice    Skilled Treatment  Pt demo'd exercises 1-3; provided skilled feedback for use of /ha/ to reduce strain (occasional min A). Laryngeal breath hold, pitch glides, and supraglottic swallow with occasional min A. Pt attempts at ex. 5 (gentle throat clear into voicing) resulted in strained, harsh, gravelly voice;  pt was instructed not to complete this exercise. Abdominal breathing at rest 90% accuracy; progressed to short phrase level tasks with AB which resulted in improved vocal quality, more appropriate (lower) pitch. In these structured tasks pt required occasional min-mod A; 80% accuracy for AB.      Assessment / Recommendations / Plan   Plan  Continue with current plan of care      Progression Toward Goals   Progression toward goals  Progressing toward goals       SLP Education - 05/28/17 1638    Education provided  Yes    Education Details  HEP, rationale for exercises    Person(s) Educated  Patient    Methods  Explanation;Demonstration;Verbal cues;Handout    Comprehension  Verbalized understanding;Returned demonstration;Verbal cues required;Need further instruction       SLP Short Term Goals - 05/28/17 1639      SLP SHORT TERM GOAL #1   Title  pt will demo HEP for vocal  fold adduction, abdominal breathing with rare min A over 3 sessions    Time  4    Period  Weeks    Status  On-going      SLP SHORT TERM GOAL #2   Title  pt will respond with at least 20 sentences average low-70s dB over three sessions     Time  4    Period  Weeks    Status  On-going      SLP SHORT TERM GOAL #3   Title  pt will demo abdominal breathing 95% of the time with rare min A in sentence responses    Time  4    Period  Weeks    Status  On-going      SLP SHORT TERM GOAL #4   Title  pt will attain WNL vocal quality in >90% of 5 minutes simple conversation     Time  4    Period  Weeks    Status  On-going       SLP Long Term Goals - 05/28/17 1640      SLP LONG TERM GOAL #1   Title  pt will demo HEP for vocal fold adduction, abdominal breathing with modified independence over 3 sessions    Time  8    Period  Weeks    Status  On-going      SLP LONG TERM GOAL #2   Title  pt will talk in simple to mod complex conversation outside [place] room with WNL loudness and vocal quality over three sessions     Time  8    Period  Weeks    Status  On-going      SLP LONG TERM GOAL #3   Title  pt will demo abdominal breathing with min A rarely in 15 minutes conversation     Time  8    Period  Weeks    Status  On-going       Plan - 05/28/17 1639    Clinical Impression Statement  Pt continues to present with moderate dysphonia. Exercises for vocal fold adducation with occasional min A. Abdominal breathing at phrase level 85% accuracy with min A. Intelligibility continues to be affected. Continue skilled ST to maximize vocal quality and intelligibility for work and QOL    Speech Therapy Frequency  2x / week    Treatment/Interventions  Compensatory techniques;Cueing hierarchy;Functional tasks;SLP instruction and feedback;Patient/family education;Other (comment)    Potential to [place]  Exercise Plan  vocal cord adduction exercises provided    Consulted and Agree  with Plan of Care  Patient       Patient will benefit from skilled therapeutic intervention in order to improve the following deficits and impairments:   Other voice and resonance disorders    Problem List Patient Active Problem List   Diagnosis Date Noted  . Primary cancer of left upper lobe of lung ([place]) 05/31/2015  . S/P lobectomy of lung 05/21/2015  . Coronary artery calcification seen on CAT scan 05/14/2015  . Preoperative cardiovascular examination 05/14/2015  . Hyperlipidemia 05/14/2015  . Nodule of left lung 10/02/2014  . Stopped smoking with greater than 40 pack year history 10/02/2014  . Syncope 03/21/2014  . HTN (hypertension) 03/21/2014   Deneise Lever, [place], [place] 05/28/2017, 4:41 PM  [facility] [address] [place], [place], [address] Phone: [phone]   Fax:  [phone]   Name: Brylee Mcgreal. MRN: [id number] Date of Birth: [birth year]

## 2017-06-05 ENCOUNTER — Ambulatory Visit: Payer: PPO

## 2017-06-05 ENCOUNTER — Other Ambulatory Visit: Payer: Self-pay

## 2017-06-05 DIAGNOSIS — R498 Other voice and resonance disorders: Secondary | ICD-10-CM

## 2017-06-05 NOTE — Patient Instructions (Signed)
Try to swallow or take a sip of water instead of clearing your throat for optimal vocal fold health.

## 2017-06-05 NOTE — Therapy (Signed)
[facility] [address] [place], [place], [address] Phone: [phone]   Fax:  [phone]  Speech Language Pathology Treatment  Patient Details  Name: George Montoya. MRN: [id number] Date of Birth: Mar 04, 1941 Referring Provider: Dr. Hessie Knows   Encounter Date: 06/05/2017  End of Session - 06/05/17 0911    Visit Number  4    Number of Visits  17    Date for SLP Re-Evaluation  07/24/17    SLP Start Time  0803    SLP Stop Time   0846    SLP Time Calculation (min)  43 min    Activity Tolerance  Patient tolerated treatment well       Past Medical History:  Diagnosis Date  . Aortic aneurysm (HCC)    DR BARTLE    JUST WATCHING   . Arthritis    spine  . Bowel obstruction ([place])   . GERD (gastroesophageal reflux disease)   . Headache    hx of 2 migraines as a college roommates  . High cholesterol   . Hypertension   . Melanoma ([facility])    also basal cell carcinomas  . Meniere's disease of left ear   . Radiation 07/03/2015-08/16/2015   Left Mid Chest, surgical clips 66 gray  . Scoliosis deformity of spine   . Shingles    04/2014     RIGHT EYE, SCALP  . Shingles    right eye, forehead and head  . Shortness of breath dyspnea    WITH EXERTION   . Sleep related leg cramps   . Syncopal episodes    x 1 in October, 2015.    Past Surgical History:  Procedure Laterality Date  . APPENDECTOMY    . CATARACT EXTRACTION W/ INTRAOCULAR LENS  IMPLANT, BILATERAL    . COLON SURGERY     6 YRS AGO  "SNIPPED BAND BINDING INTESTINE"  . COLONOSCOPY    . HERNIA REPAIR    . MELANOMA EXCISION    . pilonadal cyst removed    . surgery for bowel obstruction    . THORACOTOMY/LOBECTOMY Left 05/21/2015   Procedure: LEFT THORACOTOMY/LEFT UPPER LOBECTOMY;  Surgeon: Gaye Pollack, MD;  [facility];  Service: Thoracic;  Laterality: Left;  . TONSILLECTOMY    . VASECTOMY    . VIDEO BRONCHOSCOPY WITH ENDOBRONCHIAL  NAVIGATION N/A 10/18/2014   Procedure: VIDEO BRONCHOSCOPY WITH ENDOBRONCHIAL NAVIGATION;  Surgeon: Collene Gobble, MD;  Location: [facility];  Service: Thoracic;  Laterality: N/A;    There were no vitals filed for this visit.  Subjective Assessment - 06/05/17 0807    Subjective  Pt confirmed he is here for lt vocal cord paresis.    Currently in Pain?  No/denies            ADULT SLP TREATMENT - 06/05/17 0809      General Information   Behavior/Cognition  Alert;Cooperative;Pleasant mood      Treatment Provided   Treatment provided  Cognitive-Linquistic      Cognitive-Linquistic Treatment   Treatment focused on  Voice    Skilled Treatment  Pt ran through HEP with cues necessary on "huh" adduction exercise as pt's adduction was soft and not "crisp" - pt return demo'd to SLP. On pitch glide exercise, pt was going low on glides to activate "growling" vocalization. SLP cued pt not to go as low and refrain from "growl". Pt return demo'd. Pt's voice with ex number 5 was clear and higher  pitch than WNL so SLP had pt continue with exercise. Pt states he has been conscious of abdominal breathing (AB) while at his desk at various times throughout the day. At rest pt demo'd AB 90-95%. He then read expressions/proverbs with AB demo'd 85% of the time. SLP told pt to cont AB practice with phrases and at rest, and to cont HEP. Pt with harsh throat clearing throughout session until SLP encouraged pt to take sip of H2O or swallow instead, and SLP explained vocal abuse due to coughing and throat clearing.      Assessment / Recommendations / Plan   Plan  Continue with current plan of care      Progression Toward Goals   Progression toward goals  Progressing toward goals       SLP Education - 06/05/17 0911    Education provided  Yes    Education Details  HEP procedure, vocal abuse due to cough/throat clearing and compensations    Person(s) Educated  Patient    Methods  Explanation    Comprehension   Verbalized understanding       SLP Short Term Goals - 06/05/17 0913      SLP SHORT TERM GOAL #1   Title  pt will demo HEP for vocal fold adduction, abdominal breathing with rare min A over 3 sessions    Time  3    Period  Weeks    Status  On-going      SLP SHORT TERM GOAL #2   Title  pt will respond with at least 20 sentences average low-70s dB over three sessions     Time  3    Period  Weeks    Status  On-going      SLP SHORT TERM GOAL #3   Title  pt will demo abdominal breathing 95% of the time with rare min A in sentence responses    Time  3    Period  Weeks    Status  On-going      SLP SHORT TERM GOAL #4   Title  pt will attain WNL vocal quality in >90% of 5 minutes simple conversation     Time  3    Period  Weeks    Status  On-going       SLP Long Term Goals - 06/05/17 0913      SLP LONG TERM GOAL #1   Title  pt will demo HEP for vocal fold adduction, abdominal breathing with modified independence over 3 sessions    Time  7    Period  Weeks    Status  On-going      SLP LONG TERM GOAL #2   Title  pt will talk in simple to mod complex conversation outside [place] room with WNL loudness and vocal quality over three sessions     Time  7    Period  Weeks    Status  On-going      SLP LONG TERM GOAL #3   Title  pt will demo abdominal breathing with min A rarely in 15 minutes conversation     Time  7    Period  Weeks    Status  On-going       Plan - 06/05/17 0912    Clinical Impression Statement  Pt continues to present with moderate dysphonia. Exercises for vocal fold adducation with occasional min A. Abdominal breathing at phrase/sentence reading level at 85% independently. Continue skilled ST to maximize vocal quality and intelligibility  for work and QOL    Speech Therapy Frequency  2x / week    Duration  -- 8 weeks or 17 total visits    Treatment/Interventions  Compensatory techniques;Cueing hierarchy;Functional tasks;SLP instruction and feedback;Patient/family  education;Other (comment)    Potential to Achieve Goals  Good       Patient will benefit from skilled therapeutic intervention in order to improve the following deficits and impairments:   Other voice and resonance disorders    Problem List Patient Active Problem List   Diagnosis Date Noted  . Primary cancer of left upper lobe of lung ([facility]) 05/31/2015  . S/P lobectomy of lung 05/21/2015  . Coronary artery calcification seen on CAT scan 05/14/2015  . Preoperative cardiovascular examination 05/14/2015  . Hyperlipidemia 05/14/2015  . Nodule of left lung 10/02/2014  . Stopped smoking with greater than 40 pack year history 10/02/2014  . Syncope 03/21/2014  . HTN (hypertension) 03/21/2014    [facility] ,[place], [place]  06/05/2017, 9:14 AM  [facility] [address] [place], [place], [address] Phone: [phone]   Fax:  [phone]   Name: George Montoya. MRN: [id number] Date of Birth: May 27, 1941

## 2017-06-08 ENCOUNTER — Ambulatory Visit: Payer: Self-pay | Admitting: Radiation Oncology

## 2017-06-08 ENCOUNTER — Other Ambulatory Visit: Payer: Self-pay

## 2017-06-08 ENCOUNTER — Ambulatory Visit: Payer: PPO | Admitting: Speech Pathology

## 2017-06-08 DIAGNOSIS — R498 Other voice and resonance disorders: Secondary | ICD-10-CM

## 2017-06-08 NOTE — Therapy (Signed)
[facility] [address] [place], [place], [address] Phone: [phone]   Fax:  [phone]  Speech Language Pathology Treatment  Patient Details  Name: George Montoya. MRN: [id number] Date of Birth: [birth year] Referring Provider: Dr. Hessie Knows   Encounter Date: 06/08/2017  End of Session - 06/08/17 1827    Visit Number  5    Number of Visits  17    Date for SLP Re-Evaluation  07/24/17    SLP Start Time  20    SLP Stop Time   1444    SLP Time Calculation (min)  42 min    Activity Tolerance  Patient tolerated treatment well       Past Medical History:  Diagnosis Date  . Aortic aneurysm (HCC)    DR BARTLE    JUST WATCHING   . Arthritis    spine  . Bowel obstruction ([place])   . GERD (gastroesophageal reflux disease)   . Headache    hx of 2 migraines as a college roommates  . High cholesterol   . Hypertension   . Melanoma ([facility] [facility])    also basal cell carcinomas  . Meniere's disease of left ear   . Radiation 07/03/2015-08/16/2015   Left Mid Chest, surgical clips 66 gray  . Scoliosis deformity of spine   . Shingles    04/2014     RIGHT EYE, SCALP  . Shingles    right eye, forehead and head  . Shortness of breath dyspnea    WITH EXERTION   . Sleep related leg cramps   . Syncopal episodes    x 1 in October, 2015.    Past Surgical History:  Procedure Laterality Date  . APPENDECTOMY    . CATARACT EXTRACTION W/ INTRAOCULAR LENS  IMPLANT, BILATERAL    . COLON SURGERY     6 YRS AGO  "SNIPPED BAND BINDING INTESTINE"  . COLONOSCOPY    . HERNIA REPAIR    . MELANOMA EXCISION    . pilonadal cyst removed    . surgery for bowel obstruction    . THORACOTOMY/LOBECTOMY Left 05/21/2015   Procedure: LEFT THORACOTOMY/LEFT UPPER LOBECTOMY;  Surgeon: Gaye Pollack, MD;  [facility];  Service: Thoracic;  Laterality: Left;  . TONSILLECTOMY    . VASECTOMY    . VIDEO BRONCHOSCOPY WITH ENDOBRONCHIAL  NAVIGATION N/A 10/18/2014   Procedure: VIDEO BRONCHOSCOPY WITH ENDOBRONCHIAL NAVIGATION;  Surgeon: Collene Gobble, MD;  Location: [facility];  Service: Thoracic;  Laterality: N/A;    There were no vitals filed for this visit.  Subjective Assessment - 06/08/17 1758    Subjective  Pt reports regular practice of HEP    Currently in Pain?  No/denies            ADULT SLP TREATMENT - 06/08/17 1402      General Information   Behavior/Cognition  Alert;Cooperative;Pleasant mood    Patient Positioning  Upright in chair      Treatment Provided   Treatment provided  Cognitive-Linquistic      Pain Assessment   Pain Assessment  No/denies pain      Cognitive-Linquistic Treatment   Treatment focused on  Voice    Skilled Treatment  SLP reviewed pt's HEP; pt had some confusion and duplicate copies of routine (today brought a copy without SLP's notes/feedback from 2 sessions prior). Pt demo'd a blending of 2 exercises (yawn-sigh / sustained a). SLP demo'd correct yawn-sigh exercise and pt completed with initial  mod cues for pitch variation. Re-instructed pt not to complete loud /a/ as he was noted to use excessive neck and laryngeal tension during this exercise in previous sessions. Pt again required cues for crisp "huh" for adduction exercise. With feedback, pt able to correct his technique and demo'd good self-monitoring of accuracy. Initial min A for avoiding "growl" with low pitch glide; SLP educated that if the exercise is generating coughing or hard throat clearing, he should refrain from completing at home and await feedback in session. Pt had many questions about rationale for exercises and vocal cord function; SLP answered questions with visual supports to explain physiology of voice.        Assessment / Recommendations / Plan   Plan  Continue with current plan of care      Progression Toward Goals   Progression toward goals  Progressing toward goals       SLP Education - 06/08/17 1826     Education provided  Yes    Education Details  HEP procedure and rationale, vocal anatomy and physiology    Person(s) Educated  Patient    Methods  Explanation;Demonstration;Handout    Comprehension  Verbalized understanding;Need further instruction       SLP Short Term Goals - 06/08/17 1829      SLP SHORT TERM GOAL #1   Title  pt will demo HEP for vocal fold adduction, abdominal breathing with rare min A over 3 sessions    Time  2    Period  Weeks    Status  On-going      SLP SHORT TERM GOAL #2   Title  pt will respond with at least 20 sentences average low-70s dB over three sessions     Time  2    Period  Weeks    Status  On-going      SLP SHORT TERM GOAL #3   Title  pt will demo abdominal breathing 95% of the time with rare min A in sentence responses    Time  2    Period  Weeks    Status  On-going      SLP SHORT TERM GOAL #4   Title  pt will attain WNL vocal quality in >90% of 5 minutes simple conversation     Time  2    Period  Weeks    Status  On-going       SLP Long Term Goals - 06/08/17 1830      SLP LONG TERM GOAL #1   Title  pt will demo HEP for vocal fold adduction, abdominal breathing with modified independence over 3 sessions    Time  6    Period  Weeks    Status  On-going      SLP LONG TERM GOAL #2   Title  pt will talk in simple to mod complex conversation outside [place] room with WNL loudness and vocal quality over three sessions     Time  6    Period  Weeks    Status  On-going      SLP LONG TERM GOAL #3   Title  pt will demo abdominal breathing with min A rarely in 15 minutes conversation     Time  6    Period  Weeks    Status  On-going       Plan - 06/08/17 1829    Clinical Impression Statement  Pt continues to present with dysphonia; vocal quality subjectively improved today with less frequent  diplophonia noted. Pitch remains above WNL for age/gender. Exercises for vocal fold adducation with occasional min A. Pt requesting information today re:  HEP as it pertains to his anatomy and function; SLP provided explanation and pt verbalized understanding. Continue skilled ST to maximize vocal quality and intelligibility for work and QOL    Speech Therapy Frequency  2x / week    Treatment/Interventions  Compensatory techniques;Cueing hierarchy;Functional tasks;SLP instruction and feedback;Patient/family education;Other (comment)    Potential to Achieve Goals  Good    SLP Home Exercise Plan  vocal cord adduction exercises provided    Consulted and Agree with Plan of Care  Patient       Patient will benefit from skilled therapeutic intervention in order to improve the following deficits and impairments:   Other voice and resonance disorders    Problem List Patient Active Problem List   Diagnosis Date Noted  . Primary cancer of left upper lobe of lung ([facility]) 05/31/2015  . S/P lobectomy of lung 05/21/2015  . Coronary artery calcification seen on CAT scan 05/14/2015  . Preoperative cardiovascular examination 05/14/2015  . Hyperlipidemia 05/14/2015  . Nodule of left lung 10/02/2014  . Stopped smoking with greater than 40 pack year history 10/02/2014  . Syncope 03/21/2014  . HTN (hypertension) 03/21/2014   Deneise Lever, [place], [place] 06/08/2017, 6:31 PM  [facility] [address] [place], [place], [address] Phone: [phone]   Fax:  [phone]   Name: Akshath Mccarey. MRN: [id number] Date of Birth: 1941-02-26

## 2017-06-09 DIAGNOSIS — Z85820 Personal history of malignant melanoma of skin: Secondary | ICD-10-CM | POA: Diagnosis not present

## 2017-06-09 DIAGNOSIS — L821 Other seborrheic keratosis: Secondary | ICD-10-CM | POA: Diagnosis not present

## 2017-06-09 DIAGNOSIS — Z85828 Personal history of other malignant neoplasm of skin: Secondary | ICD-10-CM | POA: Diagnosis not present

## 2017-06-09 DIAGNOSIS — L570 Actinic keratosis: Secondary | ICD-10-CM | POA: Diagnosis not present

## 2017-06-09 DIAGNOSIS — L820 Inflamed seborrheic keratosis: Secondary | ICD-10-CM | POA: Diagnosis not present

## 2017-06-11 ENCOUNTER — Ambulatory Visit: Payer: PPO | Admitting: Speech Pathology

## 2017-06-11 ENCOUNTER — Other Ambulatory Visit: Payer: Self-pay

## 2017-06-11 DIAGNOSIS — R498 Other voice and resonance disorders: Secondary | ICD-10-CM

## 2017-06-11 NOTE — Therapy (Signed)
[facility] [address] [place], [place], [address] Phone: [phone]   Fax:  [phone]  Speech Language Pathology Treatment  Patient Details  Name: George Montoya. MRN: [id number] Date of Birth: [birth year] Referring Provider: Dr. Hessie Knows   Encounter Date: 06/11/2017  End of Session - 06/11/17 1657    Visit Number  6    Number of Visits  17    Date for SLP Re-Evaluation  07/24/17    SLP Start Time  1446    SLP Stop Time   1531    SLP Time Calculation (min)  45 min    Activity Tolerance  Patient tolerated treatment well       Past Medical History:  Diagnosis Date  . Aortic aneurysm (HCC)    DR BARTLE    JUST WATCHING   . Arthritis    spine  . Bowel obstruction ([place])   . GERD (gastroesophageal reflux disease)   . Headache    hx of 2 migraines as a college roommates  . High cholesterol   . Hypertension   . Melanoma ([place])    also basal cell carcinomas  . Meniere's disease of left ear   . Radiation 07/03/2015-08/16/2015   Left Mid Chest, surgical clips 66 gray  . Scoliosis deformity of spine   . Shingles    04/2014     RIGHT EYE, SCALP  . Shingles    right eye, forehead and head  . Shortness of breath dyspnea    WITH EXERTION   . Sleep related leg cramps   . Syncopal episodes    x 1 in October, 2015.    Past Surgical History:  Procedure Laterality Date  . APPENDECTOMY    . CATARACT EXTRACTION W/ INTRAOCULAR LENS  IMPLANT, BILATERAL    . COLON SURGERY     6 YRS AGO  "SNIPPED BAND BINDING INTESTINE"  . COLONOSCOPY    . HERNIA REPAIR    . MELANOMA EXCISION    . pilonadal cyst removed    . surgery for bowel obstruction    . THORACOTOMY/LOBECTOMY Left 05/21/2015   Procedure: LEFT THORACOTOMY/LEFT UPPER LOBECTOMY;  Surgeon: Gaye Pollack, MD;  Location: [facility];  Service: Thoracic;  Laterality: Left;  . TONSILLECTOMY    . VASECTOMY    . VIDEO BRONCHOSCOPY WITH ENDOBRONCHIAL  NAVIGATION N/A 10/18/2014   Procedure: VIDEO BRONCHOSCOPY WITH ENDOBRONCHIAL NAVIGATION;  Surgeon: Collene Gobble, MD;  Location: [facility];  Service: Thoracic;  Laterality: N/A;    There were no vitals filed for this visit.  Subjective Assessment - 06/11/17 1449    Subjective  Pt reports practicing exercises in his car    Currently in Pain?  No/denies            ADULT SLP TREATMENT - 06/11/17 1650      General Information   Behavior/Cognition  Alert;Cooperative;Pleasant mood    Patient Positioning  Upright in chair      Treatment Provided   Treatment provided  Cognitive-Linquistic      Pain Assessment   Pain Assessment  No/denies pain      Cognitive-Linquistic Treatment   Treatment focused on  Voice    Skilled Treatment  Pt demo'd HEP with rare min A, demo'd improved self-monitoring of "going too low" on pitch glides. SLP focused session on use of breath support, louder speech for improved vocal quality. Pt demo'd AB and louder speech at phrase level with mod A occasionally,  fading to pt independence. Progressed to sentence level (8-10 words) with rare min A for more frequent breaths. As cognitive load added, pt required more frequent cues (occasional mod A) for breath support, coordination and increased vocal intensity. Pt clearing throat frequently during the session; SLP educated re: alternative suggestions (sip of water, dry swallow, breathe to increase volume) to reduce phonotraumatic behaviors.       Assessment / Recommendations / Plan   Plan  Continue with current plan of care      Progression Toward Goals   Progression toward goals  Progressing toward goals       SLP Education - 06/11/17 1656    Education provided  Yes    Education Details  breath support, alternatives to throat clearing    Person(s) Educated  Patient    Methods  Explanation;Demonstration;Verbal cues    Comprehension  Verbalized understanding;Returned demonstration       SLP Short Term Goals -  06/11/17 1659      SLP SHORT TERM GOAL #1   Title  pt will demo HEP for vocal fold adduction, abdominal breathing with rare min A over 3 sessions    Baseline  06/11/17    Time  2    Period  Weeks    Status  On-going      SLP SHORT TERM GOAL #2   Title  pt will respond with at least 20 sentences average low-70s dB over three sessions     Baseline  06/11/17    Time  2    Period  Weeks    Status  On-going      SLP SHORT TERM GOAL #3   Title  pt will demo abdominal breathing 95% of the time with rare min A in sentence responses    Time  2    Period  Weeks    Status  On-going      SLP SHORT TERM GOAL #4   Title  pt will attain WNL vocal quality in >90% of 5 minutes simple conversation     Time  2    Period  Weeks    Status  On-going       SLP Long Term Goals - 06/11/17 1700      SLP LONG TERM GOAL #1   Title  pt will demo HEP for vocal fold adduction, abdominal breathing with modified independence over 3 sessions    Time  6    Period  Weeks    Status  On-going      SLP LONG TERM GOAL #2   Title  pt will talk in simple to mod complex conversation outside [place] room with WNL loudness and vocal quality over three sessions     Time  6    Period  Weeks    Status  On-going      SLP LONG TERM GOAL #3   Title  pt will demo abdominal breathing with min A rarely in 15 minutes conversation     Time  6    Period  Weeks    Status  On-going       Plan - 06/11/17 1657    Clinical Impression Statement  Pt continues to present with dysphonia; vocal quality subjectively improved today with less frequent diplophonia noted. Pitch remains above WNL for age/gender. Exercises for vocal fold adducation with rare min A. Pt utilizing adequate breath support/ abdominal breathing at sentence level with 90% accuracy; accuracy drops and need for cuing increases with added  cognitive load. Continue skilled ST to maximize vocal quality and intelligibility for work and QOL    Speech Therapy Frequency  2x  / week    Treatment/Interventions  Compensatory techniques;Cueing hierarchy;Functional tasks;SLP instruction and feedback;Patient/family education;Other (comment)    Potential to Achieve Goals  Good    SLP Home Exercise Plan  vocal cord adduction exercises provided    Consulted and Agree with Plan of Care  Patient       Patient will benefit from skilled therapeutic intervention in order to improve the following deficits and impairments:   Other voice and resonance disorders    Problem List Patient Active Problem List   Diagnosis Date Noted  . Primary cancer of left upper lobe of lung ([place]) 05/31/2015  . S/P lobectomy of lung 05/21/2015  . Coronary artery calcification seen on CAT scan 05/14/2015  . Preoperative cardiovascular examination 05/14/2015  . Hyperlipidemia 05/14/2015  . Nodule of left lung 10/02/2014  . Stopped smoking with greater than 40 pack year history 10/02/2014  . Syncope 03/21/2014  . HTN (hypertension) 03/21/2014   Deneise Lever, [place], [place] 06/11/2017, 5:01 PM  [facility] [address] [place], [place], [address] Phone: [phone]   Fax:  [phone]   Name: George Montoya. MRN: [id number] Date of Birth: [birth year]

## 2017-06-25 ENCOUNTER — Ambulatory Visit: Payer: PPO | Attending: Otolaryngology | Admitting: Speech Pathology

## 2017-06-25 DIAGNOSIS — R498 Other voice and resonance disorders: Secondary | ICD-10-CM | POA: Diagnosis not present

## 2017-06-25 NOTE — Therapy (Signed)
[facility] [address] [place], [place], [address] Phone: [phone]   Fax:  [phone]  Speech Language Pathology Treatment  Patient Details  Name: George Montoya. MRN: [id number] Date of Birth: 11/05/1940 Referring Provider: Dr. Hessie Knows   Encounter Date: 06/25/2017  End of Session - 06/25/17 1558    Visit Number  7    Number of Visits  17    Date for SLP Re-Evaluation  07/24/17    SLP Start Time  1315    SLP Stop Time   1400    SLP Time Calculation (min)  45 min    Activity Tolerance  Patient tolerated treatment well       Past Medical History:  Diagnosis Date  . Aortic aneurysm (HCC)    DR BARTLE    JUST WATCHING   . Arthritis    spine  . Bowel obstruction ([place])   . GERD (gastroesophageal reflux disease)   . Headache    hx of 2 migraines as a college roommates  . High cholesterol   . Hypertension   . Melanoma ([place])    also basal cell carcinomas  . Meniere's disease of left ear   . Radiation 07/03/2015-08/16/2015   Left Mid Chest, surgical clips 66 gray  . Scoliosis deformity of spine   . Shingles    04/2014     RIGHT EYE, SCALP  . Shingles    right eye, forehead and head  . Shortness of breath dyspnea    WITH EXERTION   . Sleep related leg cramps   . Syncopal episodes    x 1 in October, 2015.    Past Surgical History:  Procedure Laterality Date  . APPENDECTOMY    . CATARACT EXTRACTION W/ INTRAOCULAR LENS  IMPLANT, BILATERAL    . COLON SURGERY     6 YRS AGO  "SNIPPED BAND BINDING INTESTINE"  . COLONOSCOPY    . HERNIA REPAIR    . MELANOMA EXCISION    . pilonadal cyst removed    . surgery for bowel obstruction    . THORACOTOMY/LOBECTOMY Left 05/21/2015   Procedure: LEFT THORACOTOMY/LEFT UPPER LOBECTOMY;  Surgeon: Gaye Pollack, MD;  Location: [facility];  Service: Thoracic;  Laterality: Left;  . TONSILLECTOMY    . VASECTOMY    . VIDEO BRONCHOSCOPY WITH ENDOBRONCHIAL  NAVIGATION N/A 10/18/2014   Procedure: VIDEO BRONCHOSCOPY WITH ENDOBRONCHIAL NAVIGATION;  Surgeon: Collene Gobble, MD;  Location: [facility];  Service: Thoracic;  Laterality: N/A;    There were no vitals filed for this visit.  Subjective Assessment - 06/25/17 1318    Subjective  "Last week, all of a sudden my voice got high."    Currently in Pain?  No/denies            ADULT SLP TREATMENT - 06/25/17 1315      General Information   Behavior/Cognition  Alert;Cooperative;Pleasant mood    Patient Positioning  Upright in chair      Treatment Provided   Treatment provided  Cognitive-Linquistic      Pain Assessment   Pain Assessment  No/denies pain      Cognitive-Linquistic Treatment   Treatment focused on  Voice    Skilled Treatment  Pt told SLP family members have noticed an improvement in his voice. He states he did note higher pitch and strained voice after prolonged speaking over the holidays and after speaking over background noise in a restaurant. Abdominal breathing in sentences  and multiparagraph reading with rare min A. Progressed to short conversational responses, during which pt required occasional min A. In 20 minutes simple-mod complex conversation, SLP provided verbal and visual cues (occasional min A) for more frequent breaths. Throat clearing noted x 2 this session.       Assessment / Recommendations / Plan   Plan  Continue with current plan of care      Progression Toward Goals   Progression toward goals  Progressing toward goals         SLP Short Term Goals - 06/25/17 1322      SLP SHORT TERM GOAL #1   Title  pt will demo HEP for vocal fold adduction, abdominal breathing with rare min A over 3 sessions    Time  1    Period  Weeks    Status  Achieved      SLP SHORT TERM GOAL #2   Title  pt will respond with at least 20 sentences average low-70s dB over three sessions     Baseline  06/11/17    Time  1    Period  Weeks    Status  Achieved      SLP SHORT  TERM GOAL #3   Title  pt will demo abdominal breathing 95% of the time with rare min A in sentence responses    Time  1    Period  Weeks    Status  Achieved      SLP SHORT TERM GOAL #4   Title  pt will attain WNL vocal quality in >90% of 5 minutes simple conversation     Time  1    Period  Weeks    Status  Achieved       SLP Long Term Goals - 06/25/17 1322      SLP LONG TERM GOAL #1   Title  pt will demo HEP for vocal fold adduction, abdominal breathing with modified independence over 3 sessions    Time  5    Period  Weeks    Status  On-going      SLP LONG TERM GOAL #2   Title  pt will talk in simple to mod complex conversation outside [place] room with WNL loudness and vocal quality over three sessions     Time  5    Period  Weeks    Status  On-going      SLP LONG TERM GOAL #3   Title  pt will demo abdominal breathing with min A rarely in 15 minutes conversation     Time  5    Period  Weeks    Status  On-going       Plan - 06/25/17 1559    Clinical Impression Statement  Pt continues to present with dysphonia, though with subjective improvements noted today in both vocal quality and pitch. Pt utilizing adequate breath support/ abdominal breathing at sentence level with 95% accuracy. AB 70% accuracy in 20 minutes conversation. Continue skilled ST to maximize vocal quality and intelligibility for work and QOL.    Speech Therapy Frequency  2x / week    Treatment/Interventions  Compensatory techniques;Cueing hierarchy;Functional tasks;SLP instruction and feedback;Patient/family education;Other (comment)    Potential to Achieve Goals  Good    Consulted and Agree with Plan of Care  Patient       Patient will benefit from skilled therapeutic intervention in order to improve the following deficits and impairments:   Other voice and resonance disorders  Problem List Patient Active Problem List   Diagnosis Date Noted  . Primary cancer of left upper lobe of lung ([place])  05/31/2015  . S/P lobectomy of lung 05/21/2015  . Coronary artery calcification seen on CAT scan 05/14/2015  . Preoperative cardiovascular examination 05/14/2015  . Hyperlipidemia 05/14/2015  . Nodule of left lung 10/02/2014  . Stopped smoking with greater than 40 pack year history 10/02/2014  . Syncope 03/21/2014  . HTN (hypertension) 03/21/2014   Deneise Lever, [place], [place] 06/25/2017, 4:02 PM  [facility] [address] [place], [place], [address] Phone: [phone]   Fax:  [phone]   Name: Tavaris Eudy. MRN: [id number] Date of Birth: Mar 31, 1941

## 2017-06-29 ENCOUNTER — Ambulatory Visit: Payer: PPO | Admitting: Speech Pathology

## 2017-06-29 ENCOUNTER — Other Ambulatory Visit: Payer: Self-pay

## 2017-06-29 DIAGNOSIS — R498 Other voice and resonance disorders: Secondary | ICD-10-CM | POA: Diagnosis not present

## 2017-06-29 NOTE — Therapy (Signed)
[facility] [address] [place], [place], [address] Phone: [phone]   Fax:  [phone]  Speech Language Pathology Treatment  Patient Details  Name: George Montoya. MRN: [id number] Date of Birth: [birth year] Referring Provider: Dr. Hessie Knows   Encounter Date: 06/29/2017  End of Session - 06/29/17 1306    Visit Number  8    Number of Visits  17    Date for SLP Re-Evaluation  07/24/17    SLP Start Time  1101    SLP Stop Time   1147    SLP Time Calculation (min)  46 min    Activity Tolerance  Patient tolerated treatment well       Past Medical History:  Diagnosis Date  . Aortic aneurysm (HCC)    DR BARTLE    JUST WATCHING   . Arthritis    spine  . Bowel obstruction ([place])   . GERD (gastroesophageal reflux disease)   . Headache    hx of 2 migraines as a college roommates  . High cholesterol   . Hypertension   . Melanoma ([place])    also basal cell carcinomas  . Meniere's disease of left ear   . Radiation 07/03/2015-08/16/2015   Left Mid Chest, surgical clips 66 gray  . Scoliosis deformity of spine   . Shingles    04/2014     RIGHT EYE, SCALP  . Shingles    right eye, forehead and head  . Shortness of breath dyspnea    WITH EXERTION   . Sleep related leg cramps   . Syncopal episodes    x 1 in October, 2015.    Past Surgical History:  Procedure Laterality Date  . APPENDECTOMY    . CATARACT EXTRACTION W/ INTRAOCULAR LENS  IMPLANT, BILATERAL    . COLON SURGERY     6 YRS AGO  "SNIPPED BAND BINDING INTESTINE"  . COLONOSCOPY    . HERNIA REPAIR    . MELANOMA EXCISION    . pilonadal cyst removed    . surgery for bowel obstruction    . THORACOTOMY/LOBECTOMY Left 05/21/2015   Procedure: LEFT THORACOTOMY/LEFT UPPER LOBECTOMY;  Surgeon: Gaye Pollack, MD;  [facility]: [facility];  Service: Thoracic;  Laterality: Left;  . TONSILLECTOMY    . VASECTOMY    . VIDEO BRONCHOSCOPY WITH ENDOBRONCHIAL  NAVIGATION N/A 10/18/2014   Procedure: VIDEO BRONCHOSCOPY WITH ENDOBRONCHIAL NAVIGATION;  Surgeon: Collene Gobble, MD;  Location: [facility];  Service: Thoracic;  Laterality: N/A;    There were no vitals filed for this visit.  Subjective Assessment - 06/29/17 1102    Subjective  "I was not a good student this weekend. I came down with a cold."    Currently in Pain?  No/denies            ADULT SLP TREATMENT - 06/29/17 1101      General Information   Behavior/Cognition  Alert;Cooperative;Pleasant mood    Patient Positioning  Upright in chair      Treatment Provided   Treatment provided  Cognitive-Linquistic      Cognitive-Linquistic Treatment   Treatment focused on  Voice    Skilled Treatment  Pt arrives, volume mildly reduced however pitch and vocal quality WNL. He reports attempting to be more aware of AB in conversation. Demo'd HEP with modified independence (handout). In 15 minutes conversation, pt was 90% accurate with AB, no cues required. In 20 minutes mod complex conversation outside [place] room, pt  vocal quality and loudness was WNL.      Assessment / Recommendations / Plan   Plan  Continue with current plan of care      Progression Toward Goals   Progression toward goals  Progressing toward goals         SLP Short Term Goals - 06/29/17 1303      SLP SHORT TERM GOAL #1   Title  pt will demo HEP for vocal fold adduction, abdominal breathing with rare min A over 3 sessions    Status  Achieved      SLP SHORT TERM GOAL #2   Title  pt will respond with at least 20 sentences average low-70s dB over three sessions     Status  Achieved      SLP [place] #3   Title  pt will demo abdominal breathing 95% of the time with rare min A in sentence responses    Status  Achieved      SLP SHORT TERM GOAL #4   Title  pt will attain WNL vocal quality in >90% of 5 minutes simple conversation     Status  Achieved       SLP Long Term Goals - 06/29/17 1125      SLP LONG TERM  GOAL #1   Title  pt will demo HEP for vocal fold adduction, abdominal breathing with modified independence over 3 sessions  (Pended)     Baseline  06/29/17  (Pended)     Time  4  (Pended)     Period  Weeks  (Pended)     Status  On-going  (Pended)       SLP LONG TERM GOAL #2   Title  pt will talk in simple to mod complex conversation outside [place] room with WNL loudness and vocal quality over three sessions   (Pended)     Time  4  (Pended)       SLP LONG TERM GOAL #3   Title  pt will demo abdominal breathing with min A rarely in 15 minutes conversation   (Pended)     Time  4  (Pended)     Period  Weeks  (Pended)     Status  Achieved  (Pended)        Plan - 06/29/17 1306    Clinical Impression Statement  Continue to note subjective improvements noted today in both vocal quality and pitch, however vocal fatigue noted after 20 minutes conversation. Pt utilizing adequate breath support/ abdominal breathing in conversation with 90% accuracy. Continue skilled ST to maximize vocal quality and intelligibility for work and QOL. Anticipate d/c in next 2-3 sessions.    Speech Therapy Frequency  2x / week    Treatment/Interventions  Compensatory techniques;Cueing hierarchy;Functional tasks;SLP instruction and feedback;Patient/family education;Other (comment)    Potential to Achieve Goals  Good    Consulted and Agree with Plan of Care  Patient       Patient will benefit from skilled therapeutic intervention in order to improve the following deficits and impairments:   Other voice and resonance disorders    Problem List Patient Active Problem List   Diagnosis Date Noted  . Primary cancer of left upper lobe of lung ([place]) 05/31/2015  . S/P lobectomy of lung 05/21/2015  . Coronary artery calcification seen on CAT scan 05/14/2015  . Preoperative cardiovascular examination 05/14/2015  . Hyperlipidemia 05/14/2015  . Nodule of left lung 10/02/2014  . Stopped smoking with greater than 40  pack year  history 10/02/2014  . Syncope 03/21/2014  . HTN (hypertension) 03/21/2014   Deneise Lever, [place], [place] 06/29/2017, 1:09 PM  [facility] [address] [place], [place], [address] Phone: [phone]   Fax:  [phone]   Name: George Montoya. MRN: [id number] Date of Birth: [birth year]

## 2017-07-02 ENCOUNTER — Other Ambulatory Visit: Payer: Self-pay

## 2017-07-02 ENCOUNTER — Ambulatory Visit: Payer: PPO | Admitting: Speech Pathology

## 2017-07-02 DIAGNOSIS — R498 Other voice and resonance disorders: Secondary | ICD-10-CM

## 2017-07-02 NOTE — Therapy (Signed)
[facility] [address] [place], [place], [address] Phone: [phone]   Fax:  [phone]  Speech Language Pathology Treatment  Patient Details  Name: George Montoya. MRN: [id number] Date of Birth: 02/24/1941 Referring Provider: Dr. Hessie Knows   Encounter Date: 07/02/2017  End of Session - 07/02/17 1154    Visit Number  9    Number of Visits  17    Date for SLP Re-Evaluation  07/24/17    SLP Start Time  1105    SLP Stop Time   1144    SLP Time Calculation (min)  39 min    Activity Tolerance  Patient tolerated treatment well       Past Medical History:  Diagnosis Date  . Aortic aneurysm (HCC)    DR BARTLE    JUST WATCHING   . Arthritis    spine  . Bowel obstruction ([place])   . GERD (gastroesophageal reflux disease)   . Headache    hx of 2 migraines as a college roommates  . High cholesterol   . Hypertension   . Melanoma ([facility] [facility])    also basal cell carcinomas  . Meniere's disease of left ear   . Radiation 07/03/2015-08/16/2015   Left Mid Chest, surgical clips 66 gray  . Scoliosis deformity of spine   . Shingles    04/2014     RIGHT EYE, SCALP  . Shingles    right eye, forehead and head  . Shortness of breath dyspnea    WITH EXERTION   . Sleep related leg cramps   . Syncopal episodes    x 1 in October, 2015.    Past Surgical History:  Procedure Laterality Date  . APPENDECTOMY    . CATARACT EXTRACTION W/ INTRAOCULAR LENS  IMPLANT, BILATERAL    . COLON SURGERY     6 YRS AGO  "SNIPPED BAND BINDING INTESTINE"  . COLONOSCOPY    . HERNIA REPAIR    . MELANOMA EXCISION    . pilonadal cyst removed    . surgery for bowel obstruction    . THORACOTOMY/LOBECTOMY Left 05/21/2015   Procedure: LEFT THORACOTOMY/LEFT UPPER LOBECTOMY;  Surgeon: Gaye Pollack, MD;  [facility];  Service: Thoracic;  Laterality: Left;  . TONSILLECTOMY    . VASECTOMY    . VIDEO BRONCHOSCOPY WITH ENDOBRONCHIAL  NAVIGATION N/A 10/18/2014   Procedure: VIDEO BRONCHOSCOPY WITH ENDOBRONCHIAL NAVIGATION;  Surgeon: Collene Gobble, MD;  Location: [facility];  Service: Thoracic;  Laterality: N/A;    There were no vitals filed for this visit.  Subjective Assessment - 07/02/17 1108    Subjective  "Pretty good." re: voice this week    Currently in Pain?  No/denies            ADULT SLP TREATMENT - 07/02/17 1105      General Information   Behavior/Cognition  Alert;Cooperative;Pleasant mood    Patient Positioning  Upright in chair      Treatment Provided   Treatment provided  Cognitive-Linquistic      Pain Assessment   Pain Assessment  No/denies pain      Cognitive-Linquistic Treatment   Treatment focused on  Voice    Skilled Treatment  Pt demo'd HEP with modified independence (handout). Abdominal breathing 90% accuracy, vocal quality and loudness WNL in 25 minutes conversation in moderately noisy environment. Pt reported subjective improvement in vocal endurance as well as pitch. Throat clearing x 2 during entirety of session.  Assessment / Recommendations / Plan   Plan  Continue with current plan of care      Progression Toward Goals   Progression toward goals  Progressing toward goals         SLP Short Term Goals - 07/02/17 1108      SLP SHORT TERM GOAL #1   Title  pt will demo HEP for vocal fold adduction, abdominal breathing with rare min A over 3 sessions    Status  Achieved      SLP SHORT TERM GOAL #2   Title  pt will respond with at least 20 sentences average low-70s dB over three sessions     Status  Achieved      SLP SHORT TERM GOAL #3   Title  pt will demo abdominal breathing 95% of the time with rare min A in sentence responses    Status  Achieved      SLP SHORT TERM GOAL #4   Title  pt will attain WNL vocal quality in >90% of 5 minutes simple conversation     Status  Achieved       SLP Long Term Goals - 07/02/17 1109      SLP LONG TERM GOAL #1   Title  pt will  demo HEP for vocal fold adduction, abdominal breathing with modified independence over 3 sessions  (Pended)     Time  4  (Pended)     Period  Weeks  (Pended)     Status  Achieved  (Pended)       SLP LONG TERM GOAL #2   Title  pt will talk in simple to mod complex conversation outside [place] room with WNL loudness and vocal quality over three sessions   (Pended)     Baseline  06/29/17, 07/02/17  (Pended)       SLP LONG TERM GOAL #3   Title  pt will demo abdominal breathing with min A rarely in 15 minutes conversation   (Pended)     Time  4  (Pended)     Period  Weeks  (Pended)     Status  Achieved  (Pended)        Plan - 07/02/17 1154    Clinical Impression Statement  Continue to note subjective improvements noted today in vocal quality and pitch; vocal endurance improved in 25 minutes conversation. Pt utilizing adequate breath support/ abdominal breathing in conversation with 90% accuracy. Continue skilled ST to maximize vocal quality and intelligibility for work and QOL. Anticipate d/c next visit.    Speech Therapy Frequency  2x / week    Treatment/Interventions  Compensatory techniques;Cueing hierarchy;Functional tasks;SLP instruction and feedback;Patient/family education;Other (comment)    Potential to Achieve Goals  Good    Consulted and Agree with Plan of Care  Patient       Patient will benefit from skilled therapeutic intervention in order to improve the following deficits and impairments:   Other voice and resonance disorders    Problem List Patient Active Problem List   Diagnosis Date Noted  . Primary cancer of left upper lobe of lung ([facility]) 05/31/2015  . S/P lobectomy of lung 05/21/2015  . Coronary artery calcification seen on CAT scan 05/14/2015  . Preoperative cardiovascular examination 05/14/2015  . Hyperlipidemia 05/14/2015  . Nodule of left lung 10/02/2014  . Stopped smoking with greater than 40 pack year history 10/02/2014  . Syncope 03/21/2014  . HTN (hypertension)  03/21/2014   Deneise Lever, [place], [place] Speech-Language Pathologist  Aliene Altes 07/02/2017, 11:55 AM  [facility] [address] [place], [place], [address] Phone: [phone]   Fax:  [phone]   Name: Keiron Iodice. MRN: [id number] Date of Birth: December 08, 1940

## 2017-07-06 ENCOUNTER — Ambulatory Visit: Payer: PPO | Admitting: Speech Pathology

## 2017-07-06 DIAGNOSIS — R498 Other voice and resonance disorders: Secondary | ICD-10-CM | POA: Diagnosis not present

## 2017-07-06 NOTE — Therapy (Signed)
[facility] [address] [place], [place], [address] Phone: [phone]   Fax:  [phone]  Speech Language Pathology Treatment and Progress Note  Patient Details  Name: George Montoya. MRN: [id number] Date of Birth: 03-Oct-1940 Referring Provider: Dr. Hessie Knows   Encounter Date: 07/06/2017  End of Session - 07/06/17 1706    Visit Number  10    Number of Visits  17    Date for SLP Re-Evaluation  07/24/17    SLP Start Time  25    SLP Stop Time   1143    SLP Time Calculation (min)  43 min    Activity Tolerance  Patient tolerated treatment well       Past Medical History:  Diagnosis Date  . Aortic aneurysm (HCC)    DR BARTLE    JUST WATCHING   . Arthritis    spine  . Bowel obstruction ([facility])   . GERD (gastroesophageal reflux disease)   . Headache    hx of 2 migraines as a college roommates  . High cholesterol   . Hypertension   . Melanoma ([place])    also basal cell carcinomas  . Meniere's disease of left ear   . Radiation 07/03/2015-08/16/2015   Left Mid Chest, surgical clips 66 gray  . Scoliosis deformity of spine   . Shingles    04/2014     RIGHT EYE, SCALP  . Shingles    right eye, forehead and head  . Shortness of breath dyspnea    WITH EXERTION   . Sleep related leg cramps   . Syncopal episodes    x 1 in October, 2015.    Past Surgical History:  Procedure Laterality Date  . APPENDECTOMY    . CATARACT EXTRACTION W/ INTRAOCULAR LENS  IMPLANT, BILATERAL    . COLON SURGERY     6 YRS AGO  "SNIPPED BAND BINDING INTESTINE"  . COLONOSCOPY    . HERNIA REPAIR    . MELANOMA EXCISION    . pilonadal cyst removed    . surgery for bowel obstruction    . THORACOTOMY/LOBECTOMY Left 05/21/2015   Procedure: LEFT THORACOTOMY/LEFT UPPER LOBECTOMY;  Surgeon: Gaye Pollack, MD;  Location[facility];  Service: Thoracic;  Laterality: Left;  . TONSILLECTOMY    . VASECTOMY    . VIDEO BRONCHOSCOPY WITH  ENDOBRONCHIAL NAVIGATION N/A 10/18/2014   Procedure: VIDEO BRONCHOSCOPY WITH ENDOBRONCHIAL NAVIGATION;  Surgeon: Collene Gobble, MD;  Location: [facility];  Service: Thoracic;  Laterality: N/A;    There were no vitals filed for this visit.         ADULT SLP TREATMENT - 07/06/17 1100      General Information   Behavior/Cognition  Alert;Cooperative;Pleasant mood    Patient Positioning  Upright in chair      Treatment Provided   Treatment provided  Cognitive-Linquistic      Pain Assessment   Pain Assessment  No/denies pain      Cognitive-Linquistic Treatment   Treatment focused on  Voice    Skilled Treatment  Pt mildly hoarse today; he reports having a cold, and vocal fatigue after talking at a work party for several hours on Friday. Today SLP worked with pt on breath support to improve quality; he was noted with several throat clears initially but discontinued after SLP pointed out to pt. Pitch notably higher than in recent sessions. Pitch range measured to be 110-207.7 Hz today, modal pitch 174.5 Hz (85-155  Hz is average for males). Pt with good self-monitoring of AB in 20 minutes conversation, though vocal quality remained mildly hoarse. Provided pt with maintenance home program in handout form.      Assessment / Recommendations / Plan   Plan  Continue with current plan of care      Progression Toward Goals   Progression toward goals  Progressing toward goals       SLP Education - 07/06/17 1705    Education provided  Yes    Education Details  vocal hygiene, vocal rest, maintenance HEP    Person(s) Educated  Patient    Methods  Explanation;Handout    Comprehension  Verbalized understanding       SLP Short Term Goals - 07/06/17 1709      SLP SHORT TERM GOAL #1   Title  pt will demo HEP for vocal fold adduction, abdominal breathing with rare min A over 3 sessions    Status  Achieved      SLP SHORT TERM GOAL #2   Title  pt will respond with at least 20 sentences average  low-70s dB over three sessions     Status  Achieved      SLP [place] #3   Title  pt will demo abdominal breathing 95% of the time with rare min A in sentence responses    Status  Achieved      SLP SHORT TERM GOAL #4   Title  pt will attain WNL vocal quality in >90% of 5 minutes simple conversation     Status  Achieved       SLP Long Term Goals - 07/06/17 1709      SLP LONG TERM GOAL #1   Title  pt will demo HEP for vocal fold adduction, abdominal breathing with modified independence over 3 sessions    Time  4    Period  Weeks    Status  On-going      SLP LONG TERM GOAL #2   Title  pt will talk in simple to mod complex conversation outside [place] room with WNL loudness and vocal quality over three sessions     Time  4    Period  Weeks    Status  On-going      SLP LONG TERM GOAL #3   Title  pt will demo abdominal breathing with min A rarely in 15 minutes conversation     Time  4    Period  Weeks    Status  Achieved       Plan - 07/06/17 1706    Clinical Impression Statement  Pt presents with deterioration in vocal quality and higher pitch today compared with recent sessions. Pt c/o postnasal drip, coughing. Suspect related to illness and increased vocal demands; encouraged vocal rest between periods of prolonged speaking and vocal hygiene. Self-monitoring of AB in conversation adequate today. Plan of care d/w patient. Will postpone treatment for 1 week and reassess at that time. Anticipate pt will meet long term goals and d/c in next 1-2 visits.     Speech Therapy Frequency  1x /week    Treatment/Interventions  Compensatory techniques;Cueing hierarchy;Functional tasks;SLP instruction and feedback;Patient/family education;Other (comment)    Potential to Achieve Goals  Good    SLP Home Exercise Plan  maintenance HEP provided    Consulted and Agree with Plan of Care  Patient       Patient will benefit from skilled therapeutic intervention in order to improve the  following  deficits and impairments:   Other voice and resonance disorders  G-Codes - 2017-07-31 1710    Functional Assessment Tool Used  NOMS, skilled clinical judgment    Functional Limitations  Voice    Voice Current Status [phone])  At least 20 percent but less than 40 percent impaired, limited or restricted    Voice Goal Status (V8592)  At least 1 percent but less than 20 percent impaired, limited or restricted       Problem List Patient Active Problem List   Diagnosis Date Noted  . Primary cancer of left upper lobe of lung ([facility]) 05/31/2015  . S/P lobectomy of lung 05/21/2015  . Coronary artery calcification seen on CAT scan 05/14/2015  . Preoperative cardiovascular examination 05/14/2015  . Hyperlipidemia 05/14/2015  . Nodule of left lung 10/02/2014  . Stopped smoking with greater than 40 pack year history 10/02/2014  . Syncope 03/21/2014  . HTN (hypertension) 03/21/2014   Speech Therapy Progress Note  Dates of Reporting Period: 05/21/17 to 07/31/2017  Subjective Statement: Pt has attended 10 speech therapy sessions targeting vocal fold adduction and vocal techniques to improve pitch, quality and loudness.   Objective Measurements: Self-monitoring abdominal breathing in 20 minutes conversation with 95% accuracy.   Goal Update: See goals above  Plan: Anticipate d/c in next 1-2 sessions. Frequency reduced to 1x per week.   Reason Skilled Services are Required: Pt required skilled ST in order to ensure carryover of trained techniques to conversations outside [place] room and to monitor for maintaining gains he has made in vocal quality and pitch.    Deneise Lever, [place], [place] 07/31/2017, 5:10 PM  [facility] [address] [place], [place], [address] Phone: [phone]   Fax:  [phone]   Name: Nevin Grizzle. MRN: [id number] Date of Birth: 02/21/1941

## 2017-07-06 NOTE — Patient Instructions (Addendum)
Maintenance Home Exercise Program  Abdominal Breathing: 5 minutes per day   . Shoulders down - this is a cue to relax . Place your hand on your abdomen - this helps you focus on easy abdominal breath support - the best and most relaxed way to breathe . Breathe in through your nose and fill your belly with air, watching your hand move outward . Breathe out through your mouth and watch your belly move in. An audible "sh"  may help  Practice breathing in and out in front of a mirror, watching your belly Breathe in for a count of 5 and breathe out for a count of 5  Pitch glides ("Nooo" up and down). As low as you can go without growling.   Loud reading 5-10 minutes focusing on continuing your abdominal breathing to help you project your voice vs straining in your throat. Breath as often as you need to in order to keep your voice loud and clear.

## 2017-07-09 ENCOUNTER — Encounter: Payer: PPO | Admitting: Speech Pathology

## 2017-07-13 ENCOUNTER — Ambulatory Visit: Payer: PPO | Admitting: Speech Pathology

## 2017-07-13 DIAGNOSIS — R498 Other voice and resonance disorders: Secondary | ICD-10-CM

## 2017-07-13 NOTE — Therapy (Signed)
[facility] [address] [place], [place], [address] Phone: [phone]   Fax:  [phone]  Speech Language Pathology Treatment and Discharge Summary  Patient Details  Name: George Arth. MRN: [id number] Date of Birth: 06/28/1940 Referring Provider: Dr. Hessie Knows   Encounter Date: 07/13/2017  End of Session - 07/13/17 1802    Visit Number  11    Number of Visits  17    Date for SLP Re-Evaluation  07/24/17    SLP Start Time  29    SLP Stop Time   1142    SLP Time Calculation (min)  42 min    Activity Tolerance  Patient tolerated treatment well       Past Medical History:  Diagnosis Date  . Aortic aneurysm (HCC)    DR BARTLE    JUST WATCHING   . Arthritis    spine  . Bowel obstruction ([place])   . GERD (gastroesophageal reflux disease)   . Headache    hx of 2 migraines as a college roommates  . High cholesterol   . Hypertension   . Melanoma ([facility])    also basal cell carcinomas  . Meniere's disease of left ear   . Radiation 07/03/2015-08/16/2015   Left Mid Chest, surgical clips 66 gray  . Scoliosis deformity of spine   . Shingles    04/2014     RIGHT EYE, SCALP  . Shingles    right eye, forehead and head  . Shortness of breath dyspnea    WITH EXERTION   . Sleep related leg cramps   . Syncopal episodes    x 1 in October, 2015.    Past Surgical History:  Procedure Laterality Date  . APPENDECTOMY    . CATARACT EXTRACTION W/ INTRAOCULAR LENS  IMPLANT, BILATERAL    . COLON SURGERY     6 YRS AGO  "SNIPPED BAND BINDING INTESTINE"  . COLONOSCOPY    . HERNIA REPAIR    . MELANOMA EXCISION    . pilonadal cyst removed    . surgery for bowel obstruction    . THORACOTOMY/LOBECTOMY Left 05/21/2015   Procedure: LEFT THORACOTOMY/LEFT UPPER LOBECTOMY;  Surgeon: Gaye Pollack, MD;  [facility];  Service: Thoracic;  Laterality: Left;  . TONSILLECTOMY    . VASECTOMY    . VIDEO BRONCHOSCOPY  WITH ENDOBRONCHIAL NAVIGATION N/A 10/18/2014   Procedure: VIDEO BRONCHOSCOPY WITH ENDOBRONCHIAL NAVIGATION;  Surgeon: Collene Gobble, MD;  Location: [facility];  Service: Thoracic;  Laterality: N/A;    There were no vitals filed for this visit.  Subjective Assessment - 07/13/17 1059    Subjective  "My voice is clearer and lower than when I started in November."    Currently in Pain?  No/denies            ADULT SLP TREATMENT - 07/13/17 1059      General Information   Behavior/Cognition  Alert;Cooperative;Pleasant mood    Patient Positioning  Upright in chair      Treatment Provided   Treatment provided  Cognitive-Linquistic      Pain Assessment   Pain Assessment  No/denies pain      Cognitive-Linquistic Treatment   Treatment focused on  Voice    Skilled Treatment  Patient demo'd his HEP independently. Pt reports subjective improvements in pitch and vocal quality; he has received comments from his wife and family members about improvements in voice. Reports his difficulties with voice last week resolved  as he recovered from his cold. In 35 minutes mod complex conversation outside [place] room, pt maintained adequate voicing and WNL loudness.       Assessment / Recommendations / Plan   Plan  Discharge SLP treatment due to (comment);All goals met      Progression Toward Goals   Progression toward goals  Goals met, education completed, patient discharged from [facility] - 07/13/17 1801    Education provided  Yes    Education Details  maintenance HEP    Person(s) Educated  Patient    Methods  Explanation    Comprehension  Verbalized understanding       SLP Short Term Goals - 07/13/17 1104      SLP SHORT TERM GOAL #1   Title  pt will demo HEP for vocal fold adduction, abdominal breathing with rare min A over 3 sessions    Status  Achieved      SLP SHORT TERM GOAL #2   Title  pt will respond with at least 20 sentences average low-70s dB over three sessions      Status  Achieved      SLP [place] #3   Title  pt will demo abdominal breathing 95% of the time with rare min A in sentence responses    Status  Achieved      SLP SHORT TERM GOAL #4   Title  pt will attain WNL vocal quality in >90% of 5 minutes simple conversation     Status  Achieved       SLP Long Term Goals - 07/13/17 1103      SLP LONG TERM GOAL #1   Title  pt will demo HEP for vocal fold adduction, abdominal breathing with modified independence over 3 sessions    Time  3    Period  Weeks    Status  Achieved      SLP LONG TERM GOAL #2   Title  pt will talk in simple to mod complex conversation outside [place] room with WNL loudness and vocal quality over three sessions     Time  3    Period  Weeks    Status  Achieved      SLP LONG TERM GOAL #3   Title  pt will demo abdominal breathing with min A rarely in 15 minutes conversation     Time  3    Period  Weeks    Status  Achieved       Plan - 07/13/17 1802    Clinical Impression Statement  Pt presents today with WNL vocal quality and pitch in 35 minutes mod complex conversation. Difficulties with voice last week resolved with his illness per his report. Self-monitoring of AB in conversation adequate today. Long term goals met today and all pt questions answered to his satisfaction. Pt in agreement with d/c at this time.     Speech Therapy Frequency  -- d/c    Duration  -- d/c    Treatment/Interventions  Compensatory techniques;Cueing hierarchy;Functional tasks;SLP instruction and feedback;Patient/family education;Other (comment)    Potential to Achieve Goals  Good    SLP Home Exercise Plan  maintenance HEP provided    Consulted and Agree with Plan of Care  Patient       Patient will benefit from skilled therapeutic intervention in order to improve the following deficits and impairments:   Other voice and resonance disorders    Problem List Patient  Active Problem List   Diagnosis Date Noted  . Primary cancer of  left upper lobe of lung ([facility]) 05/31/2015  . S/P lobectomy of lung 05/21/2015  . Coronary artery calcification seen on CAT scan 05/14/2015  . Preoperative cardiovascular examination 05/14/2015  . Hyperlipidemia 05/14/2015  . Nodule of left lung 10/02/2014  . Stopped smoking with greater than 40 pack year history 10/02/2014  . Syncope 03/21/2014  . HTN (hypertension) 03/21/2014   SPEECH THERAPY DISCHARGE SUMMARY  Visits from Start of Care: 11   Current functional level related to goals / functional outcomes: See goals above. Pt maintaining WNL pitch/voicing in 35 minutes conversation outside [place] room today.    Remaining deficits: Pt continues to experience vocal fatigue and increased hoarseness with prolonged periods of speaking or in some environments with increased vocal demands.   Education / Equipment: Insurance underwriter, maintenance HEP  Plan: Patient agrees to discharge.  Patient goals were met. Patient is being discharged due to meeting the stated rehab goals.  ?????    Deneise Lever, [place], [place] 07/13/2017, 6:05 PM  [facility] [address] [place], [place], [address] Phone: [phone]   Fax:  [phone]   Name: George Holycross. MRN: [id number] Date of Birth: 1941/03/31

## 2017-07-16 ENCOUNTER — Encounter: Payer: PPO | Admitting: Speech Pathology

## 2017-07-20 ENCOUNTER — Encounter: Payer: PPO | Admitting: Speech Pathology

## 2017-07-22 DIAGNOSIS — E871 Hypo-osmolality and hyponatremia: Secondary | ICD-10-CM | POA: Diagnosis not present

## 2017-07-23 ENCOUNTER — Encounter: Payer: PPO | Admitting: Speech Pathology

## 2017-07-27 ENCOUNTER — Encounter: Payer: PPO | Admitting: Speech Pathology

## 2017-07-30 ENCOUNTER — Encounter: Payer: PPO | Admitting: Speech Pathology

## 2017-08-13 ENCOUNTER — Encounter: Payer: Self-pay | Admitting: Radiation Oncology

## 2017-08-13 ENCOUNTER — Other Ambulatory Visit: Payer: Self-pay

## 2017-08-13 ENCOUNTER — Ambulatory Visit
Admission: RE | Admit: 2017-08-13 | Discharge: 2017-08-13 | Disposition: A | Discharge status: Home / Self Care | Payer: PPO | Source: Ambulatory Visit | Attending: Radiation Oncology | Admitting: Radiation Oncology

## 2017-08-13 VITALS — BP 170/91 | HR 61 | Temp 98.2°F | Resp 18 | Wt 181.2 lb

## 2017-08-13 DIAGNOSIS — R05 Cough: Secondary | ICD-10-CM | POA: Diagnosis not present

## 2017-08-13 DIAGNOSIS — Z923 Personal history of irradiation: Secondary | ICD-10-CM | POA: Diagnosis not present

## 2017-08-13 DIAGNOSIS — Z79899 Other long term (current) drug therapy: Secondary | ICD-10-CM | POA: Insufficient documentation

## 2017-08-13 DIAGNOSIS — R0602 Shortness of breath: Secondary | ICD-10-CM | POA: Diagnosis not present

## 2017-08-13 DIAGNOSIS — Z85820 Personal history of malignant melanoma of skin: Secondary | ICD-10-CM | POA: Diagnosis not present

## 2017-08-13 DIAGNOSIS — I700 Atherosclerosis of aorta: Secondary | ICD-10-CM | POA: Diagnosis not present

## 2017-08-13 DIAGNOSIS — I2510 Atherosclerotic heart disease of native coronary artery without angina pectoris: Secondary | ICD-10-CM | POA: Insufficient documentation

## 2017-08-13 DIAGNOSIS — Z08 Encounter for follow-up examination after completed treatment for malignant neoplasm: Secondary | ICD-10-CM | POA: Diagnosis not present

## 2017-08-13 DIAGNOSIS — R21 Rash and other nonspecific skin eruption: Secondary | ICD-10-CM | POA: Diagnosis not present

## 2017-08-13 DIAGNOSIS — Z888 Allergy status to other drugs, medicaments and biological substances status: Secondary | ICD-10-CM | POA: Diagnosis not present

## 2017-08-13 DIAGNOSIS — Z85118 Personal history of other malignant neoplasm of bronchus and lung: Secondary | ICD-10-CM | POA: Insufficient documentation

## 2017-08-13 DIAGNOSIS — Z9889 Other specified postprocedural states: Secondary | ICD-10-CM | POA: Insufficient documentation

## 2017-08-13 DIAGNOSIS — C3412 Malignant neoplasm of upper lobe, left bronchus or lung: Secondary | ICD-10-CM

## 2017-08-13 DIAGNOSIS — Z791 Long term (current) use of non-steroidal anti-inflammatories (NSAID): Secondary | ICD-10-CM | POA: Insufficient documentation

## 2017-08-13 NOTE — Progress Notes (Signed)
Mr Matsuoka is here today for a follow -up appointment.Denies any pain or fatigue. States that he has shortness of breath with activity and high elevations. Denies any difficulty with swallowing.  States that he has a dry cough. States that his appetite is good.Denies any issues with his skin. Blood pressure was elevated. States that his doctor has increased his medication .Denies any headaches,numbness or tingling in his extremities,or chest pains. Vitals:   08/13/17 1140  BP: (!) 170/91  Pulse: 61  Resp: 18  Temp: 98.2 F (36.8 C)  TempSrc: Oral  SpO2: 100%  Weight: 181 lb 4 oz (82.2 kg)   Wt Readings from Last 3 Encounters:  08/13/17 181 lb 4 oz (82.2 kg)  04/29/17 177 lb (80.3 kg)  12/11/16 185 lb 6.4 oz (84.1 kg)

## 2017-08-13 NOTE — Progress Notes (Signed)
Radiation Oncology         [phone]) [phone] ________________________________  Name: George Montoya. MRN: [id number]  Date: 08/13/2017  DOB: May 08, 1941  Follow-Up Visit Note  CC: Leanna Battles, MD  Gaye Pollack, MD   Diagnosis:  Stage IB Well-differentiated adenocarcinoma of the left lung with positive surgical margins   Radiation treatment dates:  2 years  07/03/2015-08/16/2015: Left mid chest, surgical clips, 66 gray in 33 fractions, post op  Narrative:  The patient returns today for routine follow-up.   Since his last visit to the office, he underwent CT Chest without contrast on 04/29/2017 with results of: Stable postop changes and radiation fibrosis in left lung. No evidence of recurrent or metastatic carcinoma, or other acute findings. Aortic Atherosclerosis (ICD10-I70.0). Coronary artery calcification.  He reports that he had issues with his vocal cords and was followed up with ENT specialist, Dr. Redmond Baseman who recommended either observation or follow up with Speech therapist through exercises. He notes that following follow up with the speech language pathologist, his vocal cords are perfectly fine at this time. He notes that he is walking 12,000-14,000 steps a day and works out with free weights.    On review of systems, he reports persistent cough that is intermittently dry and productive, BLE cramping that he treats with 0.5 tsp baking soda, sparse rash to his torso that he is being seen by a dermatologist for, and SOB especially at high altitudes. He reports that he now has to stop and catch his breath while going uphill and he is not able to talk while walking. He reports that he went to [place] this Summer and he didn't have any issues with breathing due to the flat terrain. He denies skin problems/itching and any other symptoms. He reports that he has a prior hx of melanoma of his back that he is being evaluated every 6 months by his dermatologist.   ALLERGIES:  is allergic  to dust mite extract.  Meds: Current Outpatient Medications  Medication Sig Dispense Refill  . Calcium Carb-Cholecalciferol (CALCIUM 600 + D PO) Take 600 mg by mouth daily.    . carvedilol (COREG) 25 MG tablet Take 1 tablet (25 mg total) by mouth 2 (two) times daily with a meal. 60 tablet 3  . ibuprofen (ADVIL,MOTRIN) 200 MG tablet Take 200 mg by mouth every 6 (six) hours as needed for mild pain or moderate pain.    . Multiple Vitamin (MULTIVITAMIN WITH MINERALS) TABS tablet Take 2 tablets by mouth daily.    . potassium chloride SA (K-DUR,KLOR-CON) 20 MEQ tablet     . rosuvastatin (CRESTOR) 10 MG tablet Take 5 mg by mouth every Monday, Wednesday, and Friday. In AM    . telmisartan-hydrochlorothiazide (MICARDIS HCT) 40-12.5 MG tablet TAKE 1/2 TABLET DAILY. 15 tablet 10   Current Facility-Administered Medications  Medication Dose Route Frequency Provider Last Rate Last Dose  . 0.9 %  sodium chloride infusion  500 mL Intravenous Continuous Armbruster, Carlota Raspberry, MD        Physical Findings: The patient is in no acute distress. Patient is alert and oriented.  weight is 181 lb 4 oz (82.2 kg). His oral temperature is 98.2 F (36.8 C). His blood pressure is 170/91 (abnormal) and his pulse is 61. His respiration is 18 and oxygen saturation is 100%.  Lungs are clear to auscultation bilaterally. Heart has regular rate and rhythm. No palpable cervical, supraclavicular or axillary lymphoadenopathy. Left thoracotomy scar well healed without sign of  recurrence in this area. Mild hyperpigmentation changes in the left upper back region.   Lab Findings: Lab Results  Component Value Date   WBC 10.0 05/22/2015   HGB 12.0 (L) 05/22/2015   HCT 34.9 (L) 05/22/2015   MCV 100.0 05/22/2015   PLT 181 05/22/2015    Radiographic Findings: No results found.  Impression:  No evidence of disease recurrence on clinical exam and recent Chest CT scan.   Plan:  He will follow up with Dr. Cyndia Bent in May 2019 with  chest CT scan at that time;  follow up with Radiation Oncology in 6 months,    -----------------------------------  Blair Promise, PhD, MD  This document serves as a record of services personally performed by Gery Pray, MD. It was created on his behalf by [facility], a trained medical scribe. The creation of this record is based on the scribe's personal observations and the provider's statements to them. This document has been checked and approved by the attending provider.

## 2017-08-30 DIAGNOSIS — W19XXXA Unspecified fall, initial encounter: Secondary | ICD-10-CM | POA: Diagnosis not present

## 2017-08-30 DIAGNOSIS — S0101XA Laceration without foreign body of scalp, initial encounter: Secondary | ICD-10-CM | POA: Diagnosis not present

## 2017-08-30 DIAGNOSIS — I10 Essential (primary) hypertension: Secondary | ICD-10-CM | POA: Diagnosis not present

## 2017-08-30 DIAGNOSIS — Y9289 Other specified places as the place of occurrence of the external cause: Secondary | ICD-10-CM | POA: Diagnosis not present

## 2017-09-07 DIAGNOSIS — Z6828 Body mass index (BMI) 28.0-28.9, adult: Secondary | ICD-10-CM | POA: Diagnosis not present

## 2017-09-07 DIAGNOSIS — S0191XA Laceration without foreign body of unspecified part of head, initial encounter: Secondary | ICD-10-CM | POA: Diagnosis not present

## 2017-09-07 DIAGNOSIS — Z4802 Encounter for removal of sutures: Secondary | ICD-10-CM | POA: Diagnosis not present

## 2017-10-09 ENCOUNTER — Emergency Department (HOSPITAL_COMMUNITY): Payer: PPO

## 2017-10-09 ENCOUNTER — Emergency Department (HOSPITAL_COMMUNITY)
Admission: EM | Admit: 2017-10-09 | Discharge: 2017-10-09 | Disposition: A | Discharge status: Home / Self Care | Payer: PPO | Attending: Emergency Medicine | Admitting: Emergency Medicine

## 2017-10-09 ENCOUNTER — Encounter (HOSPITAL_COMMUNITY): Payer: Self-pay | Admitting: Emergency Medicine

## 2017-10-09 ENCOUNTER — Other Ambulatory Visit: Payer: Self-pay

## 2017-10-09 DIAGNOSIS — Y939 Activity, unspecified: Secondary | ICD-10-CM | POA: Insufficient documentation

## 2017-10-09 DIAGNOSIS — I10 Essential (primary) hypertension: Secondary | ICD-10-CM | POA: Insufficient documentation

## 2017-10-09 DIAGNOSIS — Z79899 Other long term (current) drug therapy: Secondary | ICD-10-CM | POA: Diagnosis not present

## 2017-10-09 DIAGNOSIS — Z87891 Personal history of nicotine dependence: Secondary | ICD-10-CM | POA: Insufficient documentation

## 2017-10-09 DIAGNOSIS — S060X0A Concussion without loss of consciousness, initial encounter: Secondary | ICD-10-CM | POA: Diagnosis not present

## 2017-10-09 DIAGNOSIS — Y999 Unspecified external cause status: Secondary | ICD-10-CM | POA: Diagnosis not present

## 2017-10-09 DIAGNOSIS — S01111A Laceration without foreign body of right eyelid and periocular area, initial encounter: Secondary | ICD-10-CM | POA: Insufficient documentation

## 2017-10-09 DIAGNOSIS — R51 Headache: Secondary | ICD-10-CM | POA: Insufficient documentation

## 2017-10-09 DIAGNOSIS — Z85118 Personal history of other malignant neoplasm of bronchus and lung: Secondary | ICD-10-CM | POA: Diagnosis not present

## 2017-10-09 DIAGNOSIS — W1830XA Fall on same level, unspecified, initial encounter: Secondary | ICD-10-CM | POA: Diagnosis not present

## 2017-10-09 DIAGNOSIS — Y92009 Unspecified place in unspecified non-institutional (private) residence as the place of occurrence of the external cause: Secondary | ICD-10-CM | POA: Insufficient documentation

## 2017-10-09 DIAGNOSIS — H05229 Edema of unspecified orbit: Secondary | ICD-10-CM | POA: Diagnosis not present

## 2017-10-09 LAB — CBC WITH DIFFERENTIAL/PLATELET
BASOS ABS: 0 10*3/uL (ref 0.0–0.1)
BASOS PCT: 0 %
EOS PCT: 3 %
Eosinophils Absolute: 0.1 10*3/uL (ref 0.0–0.7)
HCT: 39.3 % (ref 39.0–52.0)
Hemoglobin: 13.9 g/dL (ref 13.0–17.0)
Lymphocytes Relative: 15 %
Lymphs Abs: 0.7 10*3/uL (ref 0.7–4.0)
MCH: 35.6 pg — ABNORMAL HIGH (ref 26.0–34.0)
MCHC: 35.4 g/dL (ref 30.0–36.0)
MCV: 100.8 fL — ABNORMAL HIGH (ref 78.0–100.0)
Monocytes Absolute: 0.6 10*3/uL (ref 0.1–1.0)
Monocytes Relative: 11 %
Neutro Abs: 3.6 10*3/uL (ref 1.7–7.7)
Neutrophils Relative %: 71 %
PLATELETS: 197 10*3/uL (ref 150–400)
RBC: 3.9 MIL/uL — ABNORMAL LOW (ref 4.22–5.81)
RDW: 12.4 % (ref 11.5–15.5)
WBC: 5.1 10*3/uL (ref 4.0–10.5)

## 2017-10-09 LAB — I-STAT CHEM 8, ED
BUN: 12 mg/dL (ref 6–20)
CALCIUM ION: 1.08 mmol/L — AB (ref 1.15–1.40)
Chloride: 94 mmol/L — ABNORMAL LOW (ref 101–111)
Creatinine, Ser: 1.1 mg/dL (ref 0.61–1.24)
Glucose, Bld: 93 mg/dL (ref 65–99)
HCT: 43 % (ref 39.0–52.0)
HEMOGLOBIN: 14.6 g/dL (ref 13.0–17.0)
Potassium: 3.6 mmol/L (ref 3.5–5.1)
Sodium: 133 mmol/L — ABNORMAL LOW (ref 135–145)
TCO2: 27 mmol/L (ref 22–32)

## 2017-10-09 LAB — PROTIME-INR
INR: 0.95
PROTHROMBIN TIME: 12.6 s (ref 11.4–15.2)

## 2017-10-09 NOTE — ED Triage Notes (Signed)
Pt arriving POV with laceration over right eye. Pt states he got up to use the bathroom but does not remember falling and hitting his head. Pts wife also reports that she noticed pt having difficulty with short term memory after falling.

## 2017-10-09 NOTE — ED Provider Notes (Signed)
[facility] DEPT Provider Note   CSN: [id number] Arrival date & time: 10/09/17  0443     History   Chief Complaint Chief Complaint  Patient presents with  . Laceration    HPI George Montoya. is a 77 y.o. male.  The history is provided by the patient and the spouse.  Laceration   Incident onset: Prior to arrival. The pain is mild. The pain has been constant since onset. He reports no foreign bodies present. His tetanus status is UTD.   Patient reports he woke up to go the restroom, and fell to the floor.  He does not recall how he fell, and the wife heard him fall.  She does not think he lost lost consciousness.  Since that time is been having difficulty with short-term memory.  He has mild headache.  No vomiting.  No diplopia No neck or back pain Prior to the fall he was otherwise well.  He does not take anticoagulants Past Medical History:  Diagnosis Date  . Aortic aneurysm (HCC)    DR BARTLE    JUST WATCHING   . Arthritis    spine  . Bowel obstruction ([place])   . GERD (gastroesophageal reflux disease)   . Headache    hx of 2 migraines as a college roommates  . High cholesterol   . Hypertension   . Melanoma ([place])    also basal cell carcinomas  . Meniere's disease of left ear   . Radiation 07/03/2015-08/16/2015   Left Mid Chest, surgical clips 66 gray  . Scoliosis deformity of spine   . Shingles    04/2014     RIGHT EYE, SCALP  . Shingles    right eye, forehead and head  . Shortness of breath dyspnea    WITH EXERTION   . Sleep related leg cramps   . Syncopal episodes    x 1 in October, 2015.    Patient Active Problem List   Diagnosis Date Noted  . Primary cancer of left upper lobe of lung ([place]) 05/31/2015  . S/P lobectomy of lung 05/21/2015  . Coronary artery calcification seen on CAT scan 05/14/2015  . Preoperative cardiovascular examination 05/14/2015  . Hyperlipidemia 05/14/2015  . Nodule of left lung 10/02/2014  .  Stopped smoking with greater than 40 pack year history 10/02/2014  . Syncope 03/21/2014  . HTN (hypertension) 03/21/2014    Past Surgical History:  Procedure Laterality Date  . APPENDECTOMY    . CATARACT EXTRACTION W/ INTRAOCULAR LENS  IMPLANT, BILATERAL    . COLON SURGERY     6 YRS AGO  "SNIPPED BAND BINDING INTESTINE"  . COLONOSCOPY    . HERNIA REPAIR    . MELANOMA EXCISION    . pilonadal cyst removed    . surgery for bowel obstruction    . THORACOTOMY/LOBECTOMY Left 05/21/2015   Procedure: LEFT THORACOTOMY/LEFT UPPER LOBECTOMY;  Surgeon: Gaye Pollack, MD;  Location: [facility] OR;  Service: Thoracic;  Laterality: Left;  . TONSILLECTOMY    . VASECTOMY    . VIDEO BRONCHOSCOPY WITH ENDOBRONCHIAL NAVIGATION N/A 10/18/2014   Procedure: VIDEO BRONCHOSCOPY WITH ENDOBRONCHIAL NAVIGATION;  Surgeon: Collene Gobble, MD;  Location: [facility];  Service: Thoracic;  Laterality: N/A;        Home Medications    Prior to Admission medications   Medication Sig Start Date End Date Taking? Authorizing Provider  Calcium Carb-Cholecalciferol (CALCIUM 600 + D PO) Take 600 mg by mouth daily.  [provider]  carvedilol (COREG) 25 MG tablet Take 1 tablet (25 mg total) by mouth 2 (two) times daily with a meal. 05/25/15   Barrett, Erin R, PA-C  ibuprofen (ADVIL,MOTRIN) 200 MG tablet Take 200 mg by mouth every 6 (six) hours as needed for mild pain or moderate pain.    [provider]  Multiple Vitamin (MULTIVITAMIN WITH MINERALS) TABS tablet Take 2 tablets by mouth daily.    [provider]  potassium chloride SA (K-DUR,KLOR-CON) 20 MEQ tablet  04/22/17   [provider]  rosuvastatin (CRESTOR) 10 MG tablet Take 5 mg by mouth every Monday, Wednesday, and Friday. In AM    [provider]  telmisartan-hydrochlorothiazide (MICARDIS HCT) 40-12.5 MG tablet TAKE 1/2 TABLET DAILY. 04/09/15   Croitoru, Dani Gobble, MD    Family History Family History  Problem Relation Age of  Onset  . Breast cancer Mother   . Prostate cancer Father     Social History Social History   Tobacco Use  . Smoking status: Former Smoker    Packs/day: 2.00    Years: 25.00    Pack years: 50.00    Types: Cigarettes    Last attempt to quit: 10/21/1981    Years since quitting: 35.9  . Smokeless tobacco: Never Used  Substance Use Topics  . Alcohol use: Yes    Alcohol/week: 12.0 oz    Types: 20 Standard drinks or equivalent per week    Comment: occ  . Drug use: No     Allergies   Dust mite extract   Review of Systems Review of Systems  Constitutional: Negative for fever.  Eyes: Negative for visual disturbance.  Cardiovascular: Negative for chest pain.  Musculoskeletal: Negative for back pain and neck pain.  Neurological: Positive for headaches. Negative for weakness.  All other systems reviewed and are negative.    Physical Exam Updated Vital Signs BP (!) 155/94 (BP Location: Right Arm)   Pulse 72   Temp 97.7 F (36.5 C) (Oral)   Resp 16   SpO2 98%   Physical Exam CONSTITUTIONAL: Elderly but well-appearing HEAD: Normocephalic/atraumatic EYES: EOMI/PERRL, small laceration right eyebrow, mild periorbital bruising on the right, no crepitus ENMT: Mucous membranes moist, no nasal or dental trauma NECK: supple no meningeal signs SPINE/BACK:entire spine nontender CV: S1/S2 noted, no murmurs/rubs/gallops noted LUNGS: Lungs are clear to auscultation bilaterally, no apparent distress ABDOMEN: soft, nontender NEURO: Pt is awake/alert/appropriate, moves all extremitiesx4.  No facial droop.  GCS is 15 EXTREMITIES: pulses normal/equal, full ROM All other extremities/joints palpated/ranged and nontender SKIN: warm, color normal PSYCH: no abnormalities of mood noted, alert and oriented to situation   ED Treatments / Results  Labs (all labs ordered are listed, but only abnormal results are displayed) Labs Reviewed  CBC WITH DIFFERENTIAL/PLATELET - Abnormal; Notable for  the following components:      Result Value   RBC 3.90 (*)    MCV 100.8 (*)    MCH 35.6 (*)    All other components within normal limits  I-STAT CHEM 8, ED - Abnormal; Notable for the following components:   Sodium 133 (*)    Chloride 94 (*)    Calcium, Ion 1.08 (*)    All other components within normal limits  PROTIME-INR    EKG None  Radiology Ct Head Wo Contrast  Result Date: 10/09/2017 CLINICAL DATA:  Laceration above the right orbit status post fall. Patient does not remember fall. EXAM: CT HEAD WITHOUT CONTRAST TECHNIQUE: Contiguous axial images  were obtained from the base of the skull through the vertex without intravenous contrast. COMPARISON:  PET/CT performed 09/14/2014 FINDINGS: Brain: No evidence of acute infarction, hemorrhage, hydrocephalus, extra-axial collection or mass lesion / mass effect. Prominence of the ventricles and sulci reflects moderate cortical volume loss. Cerebellar atrophy is noted. Mild periventricular white matter change likely reflects small vessel ischemic microangiopathy. The brainstem and fourth ventricle are within normal limits. The basal ganglia are unremarkable in appearance. The cerebral hemispheres demonstrate grossly normal gray-white differentiation. No mass effect or midline shift is seen. Vascular: No hyperdense vessel or unexpected calcification. Skull: There is no evidence of fracture; visualized osseous structures are unremarkable in appearance. Sinuses/Orbits: The visualized portions of the orbits are within normal limits. The paranasal sinuses and mastoid air cells are well-aerated. Other: Soft tissue swelling is noted about the right orbit. IMPRESSION: 1. No evidence of traumatic intracranial injury or fracture. 2. Moderate cortical volume loss and small vessel ischemic microangiopathy. 3. Soft tissue swelling about the right orbit. Electronically Signed   By: Garald Balding M.D.   On: 10/09/2017 07:01    Procedures Procedures (including  critical care time)  Medications Ordered in ED Medications - No data to display   Initial Impression / Assessment and Plan / ED Course  I have reviewed the triage vital signs and the nursing notes.  Pertinent labs & imaging results that were available during my care of the patient were reviewed by me and considered in my medical decision making (see chart for details).     Patient presents after fall with small laceration and head injury.  CT head is negative.  Labs reassuring.  He refuses EKG.  He feels back to baseline.  We discussed strict return precautions Patient and wife agree with plan  Final Clinical Impressions(s) / ED Diagnoses   Final diagnoses:  Concussion without loss of consciousness, initial encounter  Laceration of right eyebrow, initial encounter    ED Discharge Orders    None       Ripley Fraise, MD 10/09/17 [phone]

## 2017-10-09 NOTE — ED Notes (Signed)
Pt stated he is only waiting on his test result ,and if they don't need the IV in his arm he would like it out. And pt refuse EKG  Stated he fine and don't need one informed the doctor and nurse

## 2017-10-13 ENCOUNTER — Other Ambulatory Visit: Payer: Self-pay | Admitting: *Deleted

## 2017-10-13 DIAGNOSIS — R911 Solitary pulmonary nodule: Secondary | ICD-10-CM

## 2017-10-15 DIAGNOSIS — E7849 Other hyperlipidemia: Secondary | ICD-10-CM | POA: Diagnosis not present

## 2017-10-15 DIAGNOSIS — R55 Syncope and collapse: Secondary | ICD-10-CM | POA: Diagnosis not present

## 2017-10-15 DIAGNOSIS — D7589 Other specified diseases of blood and blood-forming organs: Secondary | ICD-10-CM | POA: Diagnosis not present

## 2017-10-15 DIAGNOSIS — I10 Essential (primary) hypertension: Secondary | ICD-10-CM | POA: Diagnosis not present

## 2017-10-15 DIAGNOSIS — I2510 Atherosclerotic heart disease of native coronary artery without angina pectoris: Secondary | ICD-10-CM | POA: Diagnosis not present

## 2017-10-16 ENCOUNTER — Telehealth: Payer: Self-pay

## 2017-10-16 NOTE — Telephone Encounter (Signed)
Notes sent to scheduling from Dr. Leanna Battles office.

## 2017-10-19 ENCOUNTER — Ambulatory Visit: Payer: PPO | Admitting: Cardiovascular Disease

## 2017-11-10 DIAGNOSIS — H26493 Other secondary cataract, bilateral: Secondary | ICD-10-CM | POA: Diagnosis not present

## 2017-11-10 DIAGNOSIS — H35371 Puckering of macula, right eye: Secondary | ICD-10-CM | POA: Diagnosis not present

## 2017-11-10 DIAGNOSIS — H524 Presbyopia: Secondary | ICD-10-CM | POA: Diagnosis not present

## 2017-11-11 ENCOUNTER — Ambulatory Visit: Payer: PPO | Admitting: Surgery

## 2017-11-11 ENCOUNTER — Other Ambulatory Visit: Payer: Self-pay

## 2017-11-11 ENCOUNTER — Ambulatory Visit
Admission: RE | Admit: 2017-11-11 | Discharge: 2017-11-11 | Disposition: A | Discharge status: Home / Self Care | Payer: PPO | Source: Ambulatory Visit | Attending: Surgery | Admitting: Surgery

## 2017-11-11 ENCOUNTER — Encounter: Payer: Self-pay | Admitting: Surgery

## 2017-11-11 VITALS — BP 198/98 | HR 55 | Resp 18 | Ht 68.0 in | Wt 173.0 lb

## 2017-11-11 DIAGNOSIS — R911 Solitary pulmonary nodule: Secondary | ICD-10-CM

## 2017-11-11 DIAGNOSIS — Z902 Acquired absence of lung [part of]: Secondary | ICD-10-CM

## 2017-11-11 NOTE — Progress Notes (Signed)
HPI:  The patient returns for follow up s/p left upper lobectomy for a pT1b, pN0 stage IA well differentiated adenocarcinoma on 05/21/2015. Hedid not have afissure between the upper and lower lobes and the stapled resection margin was positive. He was not a candidate for a completion pneumonectomy and received XRT to 66 gray in 59 fractionsby Dr. Alcide Goodness on 08/16/2015. Hecontinues to feelwell and has continued to walk and playgolf.  He has been walking about 14,000 steps per day and has been losing some weight.  He says that the only time that he has noticed some dyspnea was with walking a long distance.He has been eating well and maintaining his weight. He denies headaches, visual changes, hemoptysis, bonepain.  He has had 2 episodes recently when he got up out of bed in the middle of night to go the bathroom and ran into the wall, temporarily losing consciousness and falling and hitting his head.  He cannot remember any of the details of that other than having to get up and go to the bathroom quickly.  During the first episode he was at his daughter's house in [place] and had to have some staples put into a laceration on the back of his head.  The second episode occurred at home and he sustained a laceration over his right eyebrow which did not require sutures.  He had a CT scan of the head which showed some microvascular changes but no intracranial lesions.  He has been scheduled for an appointment for cardiology consultation to consider whether there is any arrhythmogenic reason for his 2 episodes of syncope.  They sound like he got up in the middle of the night and could not see where he was going and hit the wall.  He has not had any episodes like this during the day.  He is also noted some recent visual changes and saw his ophthalmologist who noted that he had some haze behind his intraocular lens implants which she is going to have removed.      Current Outpatient Medications    Medication Sig Dispense Refill  . Calcium Carb-Cholecalciferol (CALCIUM 600 + D PO) Take 600 mg by mouth daily.    . carvedilol (COREG) 25 MG tablet Take 1 tablet (25 mg total) by mouth 2 (two) times daily with a meal. 60 tablet 3  . ibuprofen (ADVIL,MOTRIN) 200 MG tablet Take 200 mg by mouth every 6 (six) hours as needed for mild pain or moderate pain.    . Multiple Vitamin (MULTIVITAMIN WITH MINERALS) TABS tablet Take 2 tablets by mouth daily.    . potassium chloride SA (K-DUR,KLOR-CON) 20 MEQ tablet     . rosuvastatin (CRESTOR) 10 MG tablet Take 5 mg by mouth every Monday, Wednesday, and Friday. In AM    . telmisartan-hydrochlorothiazide (MICARDIS HCT) 40-12.5 MG tablet TAKE 1/2 TABLET DAILY. 15 tablet 10   Current Facility-Administered Medications  Medication Dose Route Frequency Provider Last Rate Last Dose  . 0.9 %  sodium chloride infusion  500 mL Intravenous Continuous Armbruster, Carlota Raspberry, MD         Physical Exam: BP (!) 198/98 (BP Location: Left Arm, Patient Position: Sitting, Cuff Size: Normal)   Pulse (!) 55   Resp 18   Ht 5\' 8"  (1.727 m)   Wt 173 lb (78.5 kg)   SpO2 99% Comment: RA  BMI 26.30 kg/m  He looks well. There is no cervical or supraclavicular adenopathy. Cardiac exam shows a regular rate and  rhythm with normal heart sounds. Lung exam is clear. The left chest incision is well-healed.  Diagnostic Tests:  CLINICAL DATA:  Status post left upper lobe wedge resection 05/21/2015 for well differentiated lung adenocarcinoma. Postoperative radiation therapy completed 08/16/2015 for positive surgical margins. Restaging.  EXAM: CT CHEST WITHOUT CONTRAST  TECHNIQUE: Multidetector CT imaging of the chest was performed following the standard protocol without IV contrast.  COMPARISON:  04/29/2017 chest CT.  FINDINGS: Cardiovascular: Normal heart size. No significant pericardial effusion/thickening. Three-vessel coronary atherosclerosis. Atherosclerotic  nonaneurysmal thoracic aorta. Stable prominent main pulmonary artery (3.5 cm diameter).  Mediastinum/Nodes: No discrete thyroid nodules. Unremarkable esophagus. No pathologically enlarged axillary, mediastinal or hilar lymph nodes, noting limited sensitivity for the detection of hilar adenopathy on this noncontrast study.  Lungs/Pleura: No pneumothorax. No pleural effusion. Status post left upper lobe wedge resection. Stable dense left upper perihilar consolidation with associated volume loss, distortion and bronchiectasis, compatible with radiation fibrosis. Superior segment right lower lobe 8 mm ground-glass pulmonary nodule (series 8/image 57), stable. Tiny scattered solid pulmonary nodules measuring up to 3 mm in the right middle lobe (series 8/image 105) are stable since 10/16/2014 and considered benign. No new significant pulmonary nodules. No acute consolidative airspace disease.  Upper abdomen: Diffuse hepatic steatosis. Soft tissue density 1.4 cm exophytic renal cortical lesion in the lateral upper right kidney (series 2/image 157) and hyperdense 1.2 cm renal cortical lesion in the upper left kidney (series 2/image 139), stable in size since 09/14/2014 PET-CT, probably benign.  Musculoskeletal: No aggressive appearing focal osseous lesions. Marked thoracic spondylosis.  IMPRESSION: 1. Stable post treatment changes from left upper lobe wedge resection and upper left perihilar radiation fibrosis, with no evidence of local tumor recurrence. 2. No findings suspicious for metastatic disease in the chest. 3. Ground-glass 8 mm superior segment right lower lobe pulmonary nodule, stable since 2016. Follow-up chest CT is recommended every 2 years until 5 years of stability has been established. This recommendation follows the consensus statement: Guidelines for Management of Incidental Pulmonary Nodules Detected on CT Images:From the Fleischner Society 2017; published online  before print (10.1148/radiol.2130865784). 4. Stable dilated main pulmonary artery, suggesting chronic pulmonary arterial hypertension. 5. Three-vessel coronary atherosclerosis. 6. Diffuse hepatic steatosis.  Aortic Atherosclerosis (ICD10-I70.0).   Electronically Signed   By: Ilona Sorrel M.D.   On: 11/11/2017 17:12   Impression:  Overall I think he is doing well and his CT scan shows no evidence of local tumor recurrence in the left lung and no finding suspicious for metastatic disease in the chest.  There are stable postradiation treatment changes in the superior portion of the residual left lung and a stable groundglass density measuring 8 mm in the superior segment of the right lower lobe.  This is not changed since 2016.  I reviewed the CT images with him and answered his questions.  Plan:  I will plan to see him back in 6 months with a repeat CT scan of the chest for lung cancer surveillance.  He has appointment in August 2019 with Dr. Sondra Come.  I spent 15 minutes performing this established patient evaluation and > 50% of this time was spent face to face counseling and coordinating the care of this patient's lung cancer surveillance.    Gaye Pollack, MD [facility] [phone]

## 2017-11-12 ENCOUNTER — Telehealth: Payer: Self-pay | Admitting: Cardiology

## 2017-11-12 ENCOUNTER — Ambulatory Visit (INDEPENDENT_AMBULATORY_CARE_PROVIDER_SITE_OTHER): Payer: PPO | Admitting: Cardiology

## 2017-11-12 ENCOUNTER — Encounter

## 2017-11-12 ENCOUNTER — Encounter: Payer: Self-pay | Admitting: Cardiology

## 2017-11-12 VITALS — BP 172/90 | HR 56 | Ht 67.0 in | Wt 180.2 lb

## 2017-11-12 DIAGNOSIS — I10 Essential (primary) hypertension: Secondary | ICD-10-CM

## 2017-11-12 DIAGNOSIS — Z902 Acquired absence of lung [part of]: Secondary | ICD-10-CM | POA: Diagnosis not present

## 2017-11-12 DIAGNOSIS — I2510 Atherosclerotic heart disease of native coronary artery without angina pectoris: Secondary | ICD-10-CM | POA: Diagnosis not present

## 2017-11-12 DIAGNOSIS — R55 Syncope and collapse: Secondary | ICD-10-CM | POA: Diagnosis not present

## 2017-11-12 MED ORDER — CARVEDILOL 6.25 MG PO TABS: 6.2500 mg | ORAL_TABLET | Freq: Two times a day (BID) | ORAL | 6 refills | Status: DC

## 2017-11-12 MED ORDER — TELMISARTAN-HCTZ 40-12.5 MG PO TABS: 1.0000 | ORAL_TABLET | Freq: Every day | ORAL | 6 refills | Status: DC

## 2017-11-12 MED ORDER — CARVEDILOL 12.5 MG PO TABS: 12.5000 mg | ORAL_TABLET | Freq: Two times a day (BID) | ORAL | 6 refills | Status: DC

## 2017-11-12 NOTE — Patient Instructions (Addendum)
Decrease Coreg to 6.25 mg twice a day   Increase Micardis/Hct to 40/12.5 mg daily    Schedule 7 day event monitor    Your physician recommends that you schedule a follow-up appointment with Kerin Ransom PA after monitor    Your physician recommends that you schedule a follow-up appointment with Dr.Croitoru in 3 to 4 months

## 2017-11-12 NOTE — Assessment & Plan Note (Signed)
Uncontrolled 

## 2017-11-12 NOTE — Telephone Encounter (Signed)
Received call from pharmacist at [facility].She stated she was calling to verify coreg dose.Stated patient has been taking coreg 12.5 mg twice a day.Spoke to [employer] PA he advised to decrease coreg to 6.25 mg twice a day.

## 2017-11-12 NOTE — Progress Notes (Signed)
Not unreasonable to do an implantable loop recorder. MCr

## 2017-11-12 NOTE — Progress Notes (Signed)
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 CT scan then showed coronary Ca++ though he was asymptomatic. Myoview Nov 2016 was low risk, a prior echo from Oct 2016 was essentially normal. We have not seen him since. He has done well from the standpoint of his lung cancer, he just saw Dr Cyndia Bent for follow up.   The pt has had two episodes of syncope and collapse, one in March and one in April. Both scenarios sound similar. On both occasions he had gotten up from bed to go use the bathroom. On one occasion he fell back hitting the nightstand which required stiches. In April he got up to use the bathroom and apparently collapsed as he got to the door, again resulting in an injury above his eye. On the second episode the pt's wife reported he was confused for 15 minutes or so. No tonic clonic movements or incontinence. Head CT was negative in ED, pt refused an EKG (?). In the office today he is bradycardic at rest with a HR of 56. He says his HR is always 50-60. His B/P is labile, he tells me its always up at the doctors office and goes up when there is stress at work. He denies chest pain or DOE. He is active, he walks "4000 steps daily".    Current Outpatient Medications  Medication Sig Dispense Refill  . Calcium Carb-Cholecalciferol (CALCIUM 600 + D PO) Take 600 mg by mouth daily.    Marland Kitchen ibuprofen (ADVIL,MOTRIN) 200 MG tablet Take 200 mg by mouth every 6 (six) hours as needed for mild pain or moderate pain.    . Multiple Vitamin (MULTIVITAMIN WITH MINERALS) TABS tablet Take 2 tablets by mouth daily.    . potassium chloride SA (K-DUR,KLOR-CON) 20 MEQ tablet     . rosuvastatin (CRESTOR) 10 MG tablet Take 5 mg by mouth every Monday, Wednesday, and Friday. In AM    .  carvedilol (COREG) 12.5 MG tablet Take 1 tablet (12.5 mg total) by mouth 2 (two) times daily. 60 tablet 6  . telmisartan-hydrochlorothiazide (MICARDIS HCT) 40-12.5 MG tablet Take 1 tablet by mouth daily. 30 tablet 6   Current Facility-Administered Medications  Medication Dose Route Frequency Provider Last Rate Last Dose  . 0.9 %  sodium chloride infusion  500 mL Intravenous Continuous Armbruster, Carlota Raspberry, MD        Allergies  Allergen Reactions  . Dust Mite Extract Other (See Comments)    Runny nose    Past Medical History:  Diagnosis Date  . Aortic aneurysm (HCC)    DR BARTLE    JUST WATCHING   . Arthritis    spine  . Bowel obstruction ([place])   . GERD (gastroesophageal reflux disease)   . Headache    hx of 2 migraines as a college roommates  . High cholesterol   . Hypertension   . Melanoma ([place])    also basal cell carcinomas  . Meniere's disease of left ear   . Radiation 07/03/2015-08/16/2015   Left Mid Chest, surgical clips 66 gray  . Scoliosis deformity of spine   . Shingles    04/2014     RIGHT EYE, SCALP  . Shingles    right eye, forehead and head  .  Shortness of breath dyspnea    WITH EXERTION   . Sleep related leg cramps   . Syncopal episodes    x 1 in October, 2015.    Social History   Socioeconomic History  . Marital status: Married    Spouse name: Not on file  . Number of children: Not on file  . Years of education: Not on file  . Highest education level: Not on file  Occupational History  . Occupation: executive  Social Needs  . Financial resource strain: Not on file  . Food insecurity:    Worry: Not on file    Inability: Not on file  . Transportation needs:    Medical: Not on file    Non-medical: Not on file  Tobacco Use  . Smoking status: Former Smoker    Packs/day: 2.00    Years: 25.00    Pack years: 50.00    Types: Cigarettes    Last attempt to quit: 10/21/1981    Years since quitting: 36.0  . Smokeless tobacco: Never Used  Substance  and Sexual Activity  . Alcohol use: Yes    Alcohol/week: 12.0 oz    Types: 20 Standard drinks or equivalent per week    Comment: occ  . Drug use: No  . Sexual activity: Not on file  Lifestyle  . Physical activity:    Days per week: Not on file    Minutes per session: Not on file  . Stress: Not on file  Relationships  . Social connections:    Talks on phone: Not on file    Gets together: Not on file    Attends religious service: Not on file    Active member of club or organization: Not on file    Attends meetings of clubs or organizations: Not on file    Relationship status: Not on file  . Intimate partner violence:    Fear of current or ex partner: Not on file    Emotionally abused: Not on file    Physically abused: Not on file    Forced sexual activity: Not on file  Other Topics Concern  . Not on file  Social History Narrative  . Not on file     Family History  Problem Relation Age of Onset  . Breast cancer Mother   . Prostate cancer Father      Review of Systems: General: negative for chills, fever, night sweats or weight changes.  Cardiovascular: negative for chest pain, dyspnea on exertion, edema, orthopnea, palpitations, paroxysmal nocturnal dyspnea or shortness of breath Dermatological: negative for rash Respiratory: negative for cough or wheezing Urologic: negative for hematuria Abdominal: negative for nausea, vomiting, diarrhea, bright red blood per rectum, melena, or hematemesis Neurologic: negative for visual changes, syncope, or dizziness All other systems reviewed and are otherwise negative except as noted above.    Blood pressure (!) 172/90, pulse (!) 56, height 5\' 7"  (1.702 m), weight 180 lb 3.2 oz (81.7 kg).  General appearance: alert, cooperative, no distress and fit appearing Neck: no carotid bruit and no JVD Lungs: clear to auscultation bilaterally Heart: regular rate and rhythm Abdomen: soft, non-tender; bowel sounds normal; no masses,  no  organomegaly Extremities: extremities normal, atraumatic, no cyanosis or edema Pulses: 2+ and symmetric Skin: Skin color, texture, turgor normal. No rashes or lesions Neurologic: Grossly normal  EKG NSR, SB 56, QTc 411  ASSESSMENT AND PLAN:   Syncope and collapse Pt seen today for evaluation of recent syncope with injury  S/P lobectomy of lung S/P lobectomy and radiation Rx for lung Ca 2016  HTN (hypertension) Uncontrolled   PLAN  I'm concerned the pt has symptomatic bradycardia. I doubt he gets orthostatic with the B/P he has in the office. Another possibility is seizure. I suggested we decrease his Coreg by half and increase his Micardis HCTZ 40/12.5 to a whole tablet a day. I would like to get a 7 day event monitor on him, I don't think we'll catch an event but we can see if he has bradycardia at night. If he does have an episode and its not related to bradycardia or arrhythmia I suspect he'll need a neurologic evaluation. F/U with me after his monitor and f/u with Dr Sallyanne Kuster in a coupled of months.   Kerin Ransom PA-C 11/12/2017 4:25 PM

## 2017-11-12 NOTE — Assessment & Plan Note (Signed)
S/P lobectomy and radiation Rx for lung Ca 2016

## 2017-11-12 NOTE — Telephone Encounter (Signed)
New Message    Pt c/o medication issue:  1. Name of Medication: Coreg  2. How are you currently taking this medication (dosage and times per day)?   3. Are you having a reaction (difficulty breathing--STAT)?   4. What is your medication issue? Pharmacy is calling on behalf of patient that is at the pharmacy to confirm the dosage.

## 2017-11-12 NOTE — Assessment & Plan Note (Signed)
Pt seen today for evaluation of recent syncope with injury

## 2017-11-24 ENCOUNTER — Ambulatory Visit (INDEPENDENT_AMBULATORY_CARE_PROVIDER_SITE_OTHER): Payer: PPO

## 2017-11-24 ENCOUNTER — Other Ambulatory Visit: Payer: Self-pay | Admitting: Cardiology

## 2017-11-24 DIAGNOSIS — I10 Essential (primary) hypertension: Secondary | ICD-10-CM

## 2017-11-24 DIAGNOSIS — R55 Syncope and collapse: Secondary | ICD-10-CM

## 2017-11-24 DIAGNOSIS — I2510 Atherosclerotic heart disease of native coronary artery without angina pectoris: Secondary | ICD-10-CM

## 2017-12-08 ENCOUNTER — Ambulatory Visit: Payer: PPO | Admitting: Cardiology

## 2017-12-08 DIAGNOSIS — D2361 Other benign neoplasm of skin of right upper limb, including shoulder: Secondary | ICD-10-CM | POA: Diagnosis not present

## 2017-12-08 DIAGNOSIS — Z85828 Personal history of other malignant neoplasm of skin: Secondary | ICD-10-CM | POA: Diagnosis not present

## 2017-12-08 DIAGNOSIS — Z85820 Personal history of malignant melanoma of skin: Secondary | ICD-10-CM | POA: Diagnosis not present

## 2017-12-08 DIAGNOSIS — D0461 Carcinoma in situ of skin of right upper limb, including shoulder: Secondary | ICD-10-CM | POA: Diagnosis not present

## 2017-12-08 DIAGNOSIS — D485 Neoplasm of uncertain behavior of skin: Secondary | ICD-10-CM | POA: Diagnosis not present

## 2017-12-08 DIAGNOSIS — D1801 Hemangioma of skin and subcutaneous tissue: Secondary | ICD-10-CM | POA: Diagnosis not present

## 2017-12-08 DIAGNOSIS — L821 Other seborrheic keratosis: Secondary | ICD-10-CM | POA: Diagnosis not present

## 2017-12-08 DIAGNOSIS — D045 Carcinoma in situ of skin of trunk: Secondary | ICD-10-CM | POA: Diagnosis not present

## 2017-12-08 DIAGNOSIS — L570 Actinic keratosis: Secondary | ICD-10-CM | POA: Diagnosis not present

## 2018-01-05 ENCOUNTER — Telehealth: Payer: Self-pay

## 2018-01-05 NOTE — Telephone Encounter (Signed)
Received patient's blood pressure log per request. See cardiac telemetry result note 12/04/17.  BP ranges - week 1 -  120-135/70-85; week 2 - 143-167/71-95. Patient reports these were daytime measurements.  Per MCr - Were daytime measurements after being at rest for at least 10 minutes? If not, they don't count. If yes, please increase Micardis HCT to 80/12.5 daily.  Called patient. Patient reports that he took these blood pressures "after rest for at least 5 minutes anyways." He states that he's been experiencing a crisis at work, increased stress. He experienced a similar situation roughly 10 years ago. He recalls having his BP medications increased at that time and being "overdosed" and feeling horrible. For this current crisis he wishes to go a slightly different route and try anti-anxiety medications for this.   I advised patient to reach out to his PCP, Dr Shon Baton office to discuss Rx. In regards to patient's blood pressure, I explained to him the importance of resting prior to checking his BP and the exercise/increased activity could 'throw off' BP readings. Encouraged patient to rest for 10-15 minutes prior to checking his BP and check BP at least 1x daily until his appointment with Dr Loletha Grayer in August. NO changes made to Micardis HCT at this time. Patient verbalized understanding and agreed with plan.  Findings discussed with MCr. MCr agreed with plan.

## 2018-01-07 DIAGNOSIS — H26491 Other secondary cataract, right eye: Secondary | ICD-10-CM | POA: Diagnosis not present

## 2018-02-11 ENCOUNTER — Other Ambulatory Visit: Payer: Self-pay

## 2018-02-11 ENCOUNTER — Encounter: Payer: Self-pay | Admitting: Radiation Oncology

## 2018-02-11 ENCOUNTER — Ambulatory Visit
Admission: RE | Admit: 2018-02-11 | Discharge: 2018-02-11 | Disposition: A | Discharge status: Home / Self Care | Payer: PPO | Source: Ambulatory Visit | Attending: Radiation Oncology | Admitting: Radiation Oncology

## 2018-02-11 VITALS — BP 154/82 | HR 55 | Temp 98.2°F | Wt 179.0 lb

## 2018-02-11 DIAGNOSIS — Z08 Encounter for follow-up examination after completed treatment for malignant neoplasm: Secondary | ICD-10-CM | POA: Diagnosis not present

## 2018-02-11 DIAGNOSIS — Z79899 Other long term (current) drug therapy: Secondary | ICD-10-CM | POA: Insufficient documentation

## 2018-02-11 DIAGNOSIS — C3492 Malignant neoplasm of unspecified part of left bronchus or lung: Secondary | ICD-10-CM | POA: Diagnosis not present

## 2018-02-11 DIAGNOSIS — C3412 Malignant neoplasm of upper lobe, left bronchus or lung: Secondary | ICD-10-CM

## 2018-02-11 DIAGNOSIS — Z85118 Personal history of other malignant neoplasm of bronchus and lung: Secondary | ICD-10-CM | POA: Diagnosis not present

## 2018-02-11 NOTE — Progress Notes (Signed)
Pt here today for a follow-up appointment for left lung cancer. Pt denies having any pain or fatigue. Pt states that he has an occasional dry cough. Pt states that sometimes he has a productive cough with clear sputum. Pt denies having hemoptysis. Pt denies having any shortness of breath. Pt denies having any painful or difficulty swallowing. Pt denies having any skin redness on his chest and back. Pt states that he is not using anything on his skin. Pt states that he is not on chemotherapy treatments. Pt states that he had a reaction to a shingles vaccination about a month ago. Pt states that he had hearing loss in his right ear from the vaccination. Pt states that the doctor prescribed prednisone and his hearing is returning.  BP (!) 154/82   Pulse (!) 55   Temp 98.2 F (36.8 C) (Oral)   Wt 179 lb (81.2 kg)   SpO2 99%   BMI 28.04 kg/m    Wt Readings from Last 3 Encounters:  02/11/18 179 lb (81.2 kg)  11/12/17 180 lb 3.2 oz (81.7 kg)  11/11/17 173 lb (78.5 kg)

## 2018-02-11 NOTE — Progress Notes (Signed)
Radiation Oncology         [phone]) [phone] ________________________________  Name: George Montoya. MRN: [id number]  Date: 02/11/2018  DOB: 06/05/1941  Follow-Up Visit Note  CC: Leanna Battles, MD  Gaye Pollack, MD  Diagnosis:  Stage IB Well-differentiated adenocarcinoma of the left lung with positive surgical margins   Radiation treatment dates:  2 years and 6 months 07/03/2015-08/16/2015: Left mid chest, surgical clips, 66 gray in 33 fractions, post op  Narrative:  The patient returns today for routine follow-up. Since his last visit to the office, he underwent a chest CT scan on 11/11/17. It reveals radiation changes, but no signs of metastatic disease.  On review of systems, he reports occasional dry cough, sometimes productive with clear sputum. He denies chest pain, hemoptysis, painful or difficulty swallowing, shortness of breath, any pain, and fatigue. He reports issues with fainting after waking up in the middle of the night. He has been following up with cardiology regarding these episodes. He also reports a reaction to a shingles vaccine that caused hearing loss; this has been relieved with steroids.   He notes taking a trip to [place] in the near future.  ALLERGIES:  is allergic to dust mite extract.  Meds: Current Outpatient Medications  Medication Sig Dispense Refill  . Calcium Carb-Cholecalciferol (CALCIUM 600 + D PO) Take 600 mg by mouth daily.    . carvedilol (COREG) 6.25 MG tablet Take 1 tablet (6.25 mg total) by mouth 2 (two) times daily. 60 tablet 6  . ibuprofen (ADVIL,MOTRIN) 200 MG tablet Take 200 mg by mouth every 6 (six) hours as needed for mild pain or moderate pain.    . Multiple Vitamin (MULTIVITAMIN WITH MINERALS) TABS tablet Take 2 tablets by mouth daily.    . potassium chloride SA (K-DUR,KLOR-CON) 20 MEQ tablet     . rosuvastatin (CRESTOR) 10 MG tablet Take 5 mg by mouth every Monday, Wednesday, and Friday. In AM    . telmisartan-hydrochlorothiazide  (MICARDIS HCT) 40-12.5 MG tablet Take 1 tablet by mouth daily. 30 tablet 6   Current Facility-Administered Medications  Medication Dose Route Frequency Provider Last Rate Last Dose  . 0.9 %  sodium chloride infusion  500 mL Intravenous Continuous Armbruster, Carlota Raspberry, MD        Physical Findings: The patient is in no acute distress. Patient is alert and oriented.  weight is 179 lb (81.2 kg). His oral temperature is 98.2 F (36.8 C). His blood pressure is 154/82 (abnormal) and his pulse is 55 (abnormal). His oxygen saturation is 99%.  Lungs are clear to auscultation bilaterally. Heart has regular rate and rhythm. No palpable cervical, supraclavicular or axillary lymphoadenopathy.  Left thoracotomy scar well healed without sign of palpable or visible recurrence in this area.   Lab Findings: Lab Results  Component Value Date   WBC 5.1 10/09/2017   HGB 14.6 10/09/2017   HCT 43.0 10/09/2017   MCV 100.8 (H) 10/09/2017   PLT 197 10/09/2017    Radiographic Findings: No results found.  Impression:  Stage IB adenocarcinoma of the left lung  No evidence of disease recurrence on clinical exam and recent chest CT scan.  Plan: Follow up in 6 months.   -----------------------------------  Blair Promise, PhD, MD  This document serves as a record of services personally performed by Gery Pray, MD. It was created on his behalf by Wilburn Mylar, a trained medical scribe. The creation of this record is based on the scribe's personal observations and  the provider's statements to them. This document has been checked and approved by the attending provider.

## 2018-02-15 ENCOUNTER — Ambulatory Visit (INDEPENDENT_AMBULATORY_CARE_PROVIDER_SITE_OTHER): Payer: PPO | Admitting: Cardiovascular Disease

## 2018-02-15 ENCOUNTER — Encounter: Payer: Self-pay | Admitting: Cardiovascular Disease

## 2018-02-15 VITALS — BP 146/80 | HR 62 | Ht 66.0 in | Wt 180.0 lb

## 2018-02-15 DIAGNOSIS — R55 Syncope and collapse: Secondary | ICD-10-CM | POA: Diagnosis not present

## 2018-02-15 DIAGNOSIS — I10 Essential (primary) hypertension: Secondary | ICD-10-CM

## 2018-02-15 NOTE — Progress Notes (Signed)
Patient ID: [name]., male   DOB: 05-25-1941, 77 y.o.   MRN: [id number]     Reason for office visit Hypertension, history of syncope  He has now had 3 syncopal events.  In 2015 at work he had an episode of syncope that was probably associated with hypotension due to an acute diarrheal illness.  It was associated with some prodromal symptoms.  However he has had syncope twice this year in March and in April.  The episodes were similar.  They both occurred when he jumped out of bed to use the restroom at around 2 or 3:00 in the morning.  He does not recall dizziness, lightheadedness, diaphoresis, nausea, flushing or any of the typical prodromal symptoms of a vasovagal event.  On both occasions he had head impact, had bleeding lacerations and required stitches, thankfully no intracranial bleeding.  Either episode was associated with tonic-clonic movements, incontinence or postictal state.  The first time he was in his daughter's home and thinks he was simply unfamiliar with the surroundings and hit an obstacle.  The second syncopal event however happened in his home.  Otherwise he feels well.  He does not have orthostatic dizziness. The patient specifically denies any chest pain at rest exertion, dyspnea at rest or with exertion, orthopnea, paroxysmal nocturnal dyspnea, palpitations, focal neurological deficits, intermittent claudication, lower extremity edema, unexplained weight gain, cough, hemoptysis or wheezing.  Echocardiogram in 2015 was essentially a normal study.  He had a normal stress test in 2016.  Blood pressure control has been good.  He wore an event monitor in June 2019 with completely normal results.  He has mild ectasia of the ascending thoracic aorta measured at 4 cm by CT.  He has previous undergone a left upper lobectomy for a stage I well-differentiated adenocarcinoma of the lung in 2016, followed by XRT. He has chronic XRT related changes in the left lower lobe.  No Known  Allergies  Current Outpatient Medications  Medication Sig Dispense Refill  . Calcium Carb-Cholecalciferol (CALCIUM 600 + D PO) Take 600 mg by mouth daily.    . carvedilol (COREG) 6.25 MG tablet Take 1 tablet (6.25 mg total) by mouth 2 (two) times daily. 60 tablet 6  . ibuprofen (ADVIL,MOTRIN) 200 MG tablet Take 200 mg by mouth every 6 (six) hours as needed for mild pain or moderate pain.    . Multiple Vitamin (MULTIVITAMIN WITH MINERALS) TABS tablet Take 2 tablets by mouth daily.    . potassium chloride SA (K-DUR,KLOR-CON) 20 MEQ tablet     . rosuvastatin (CRESTOR) 10 MG tablet Take 5 mg by mouth every Monday, Wednesday, and Friday. In AM    . telmisartan-hydrochlorothiazide (MICARDIS HCT) 40-12.5 MG tablet Take 1 tablet by mouth daily. 30 tablet 6   Current Facility-Administered Medications  Medication Dose Route Frequency Provider Last Rate Last Dose  . 0.9 %  sodium chloride infusion  500 mL Intravenous Continuous Armbruster, Carlota Raspberry, MD        Past Medical History:  Diagnosis Date  . Aortic aneurysm (HCC)    DR BARTLE    JUST WATCHING   . Arthritis    spine  . Bowel obstruction ([place])   . GERD (gastroesophageal reflux disease)   . Headache    hx of 2 migraines as a college roommates  . High cholesterol   . Hypertension   . Melanoma ([place])    also basal cell carcinomas  . Meniere's disease of left ear   .  Radiation 07/03/2015-08/16/2015   Left Mid Chest, surgical clips 66 gray  . Scoliosis deformity of spine   . Shingles    04/2014     RIGHT EYE, SCALP  . Shingles    right eye, forehead and head  . Shortness of breath dyspnea    WITH EXERTION   . Sleep related leg cramps   . Syncopal episodes    x 1 in October, 2015.    Past Surgical History:  Procedure Laterality Date  . APPENDECTOMY    . CATARACT EXTRACTION W/ INTRAOCULAR LENS  IMPLANT, BILATERAL    . COLON SURGERY     6 YRS AGO  "SNIPPED BAND BINDING INTESTINE"  . COLONOSCOPY    . HERNIA REPAIR    . MELANOMA  EXCISION    . pilonadal cyst removed    . surgery for bowel obstruction    . THORACOTOMY/LOBECTOMY Left 05/21/2015   Procedure: LEFT THORACOTOMY/LEFT UPPER LOBECTOMY;  Surgeon: Gaye Pollack, MD;  Location: [facility] OR;  Service: Thoracic;  Laterality: Left;  . TONSILLECTOMY    . VASECTOMY    . VIDEO BRONCHOSCOPY WITH ENDOBRONCHIAL NAVIGATION N/A 10/18/2014   Procedure: VIDEO BRONCHOSCOPY WITH ENDOBRONCHIAL NAVIGATION;  Surgeon: Collene Gobble, MD;  Location: [facility] OR;  Service: Thoracic;  Laterality: N/A;    Family History  Problem Relation Age of Onset  . Breast cancer Mother   . Prostate cancer Father     Social History   Socioeconomic History  . Marital status: Married    Spouse name: Not on file  . Number of children: Not on file  . Years of education: Not on file  . Highest education level: Not on file  Occupational History  . Occupation: executive  Social Needs  . Financial resource strain: Not on file  . Food insecurity:    Worry: Not on file    Inability: Not on file  . Transportation needs:    Medical: Not on file    Non-medical: Not on file  Tobacco Use  . Smoking status: Former Smoker    Packs/day: 2.00    Years: 25.00    Pack years: 50.00    Types: Cigarettes    Last attempt to quit: 10/21/1981    Years since quitting: 36.3  . Smokeless tobacco: Never Used  Substance and Sexual Activity  . Alcohol use: Yes    Alcohol/week: 20.0 standard drinks    Types: 20 Standard drinks or equivalent per week    Comment: occ  . Drug use: No  . Sexual activity: Not on file  Lifestyle  . Physical activity:    Days per week: Not on file    Minutes per session: Not on file  . Stress: Not on file  Relationships  . Social connections:    Talks on phone: Not on file    Gets together: Not on file    Attends religious service: Not on file    Active member of club or organization: Not on file    Attends meetings of clubs or organizations: Not on file    Relationship status:  Not on file  . Intimate partner violence:    Fear of current or ex partner: Not on file    Emotionally abused: Not on file    Physically abused: Not on file    Forced sexual activity: Not on file  Other Topics Concern  . Not on file  Social History Narrative  . Not on file    Review of  systems: The patient specifically denies any chest pain at rest or with exertion, dyspnea at rest or with exertion, orthopnea, paroxysmal nocturnal dyspnea, palpitations, focal neurological deficits, intermittent claudication, lower extremity edema, unexplained weight gain, cough, hemoptysis or wheezing.  The patient also denies abdominal pain, nausea, vomiting, dysphagia, diarrhea, constipation, polyuria, polydipsia, dysuria, hematuria, frequency, urgency, abnormal bleeding or bruising, fever, chills, unexpected weight changes, mood swings, change in skin or hair texture, change in voice quality, auditory or visual problems, allergic reactions or rashes, new musculoskeletal complaints other than usual "aches and pains".   PHYSICAL EXAM BP (!) 146/80 (BP Location: Left Arm, Patient Position: Sitting, Cuff Size: Normal)   Pulse 62   Ht 5\' 6"  (1.676 m)   Wt 180 lb (81.6 kg)   BMI 29.05 kg/m    General: Alert, oriented x3, no distress, appears fit and younger than his stated age Head: no evidence of trauma, PERRL, EOMI, no exophtalmos or lid lag, no myxedema, no xanthelasma; normal ears, nose and oropharynx Neck: normal jugular venous pulsations and no hepatojugular reflux; brisk carotid pulses without delay and no carotid bruits Chest: clear to auscultation, no signs of consolidation by percussion or palpation, normal fremitus, symmetrical and full respiratory excursions Cardiovascular: normal position and quality of the apical impulse, regular rhythm, normal first and second heart sounds, no murmurs, rubs or gallops Abdomen: no tenderness or distention, no masses by palpation, no abnormal pulsatility or  arterial bruits, normal bowel sounds, no hepatosplenomegaly Extremities: no clubbing, cyanosis or edema; 2+ radial, ulnar and brachial pulses bilaterally; 2+ right femoral, posterior tibial and dorsalis pedis pulses; 2+ left femoral, posterior tibial and dorsalis pedis pulses; no subclavian or femoral bruits Neurological: grossly nonfocal Psych: Normal mood and affect  ECG: May 23 - NSR Lipid Panel  No results found for: CHOL, TRIG, HDL, CHOLHDL, VLDL, LDLCALC, LDLDIRECT  BMET    Component Value Date/Time   NA 133 (L) 10/09/2017 0631   K 3.6 10/09/2017 0631   CL 94 (L) 10/09/2017 0631   CO2 24 05/22/2015 0450   GLUCOSE 93 10/09/2017 0631   BUN 12 10/09/2017 0631   CREATININE 1.10 10/09/2017 0631   CREATININE 1.1 10/04/2015 1438   CALCIUM 8.6 (L) 05/22/2015 0450   GFRNONAA >60 05/22/2015 0450   GFRAA >60 05/22/2015 0450     ASSESSMENT AND PLAN  1. Syncope: The circumstances of his syncopal event suggest they might indeed be vasovagal/hypotension, but I am worried by the absence of prodromal symptoms.  The suddenness of the syncope is also supported by the fact that he injured himself on both occasions.  This raises the concern for arrhythmia.  We discussed implantation of a loop recorder which I recommend strongly, especially since it is a low risk intervention.  He told me that he will think about it, but he will definitely consider a loop recorder if he has another syncopal event.  I told him that I am ready to go ahead with the procedure as soon as he tells me he wants it done. 2. HTN: I did not make any changes to his medications in view of the recent syncopal events.  Patient Instructions  Dr Sallyanne Kuster recommends that you schedule a follow-up appointment in 12 months. You will receive a reminder letter in the mail two months in advance. If you don't receive a letter, please call our office to schedule the follow-up appointment.  If you need a refill on your cardiac medications  before your next appointment, please call your  pharmacy.   No orders of the defined types were placed in this encounter.   Mata Rowen  Sanda Klein, MD, [facility] [phone] office [phone] pager

## 2018-02-15 NOTE — Patient Instructions (Signed)
Dr Croitoru recommends that you schedule a follow-up appointment in 12 months. You will receive a reminder letter in the mail two months in advance. If you don't receive a letter, please call our office to schedule the follow-up appointment.  If you need a refill on your cardiac medications before your next appointment, please call your pharmacy. 

## 2018-02-17 ENCOUNTER — Encounter: Payer: Self-pay | Admitting: Cardiovascular Disease

## 2018-02-17 DIAGNOSIS — H8103 Meniere's disease, bilateral: Secondary | ICD-10-CM | POA: Diagnosis not present

## 2018-02-17 DIAGNOSIS — H903 Sensorineural hearing loss, bilateral: Secondary | ICD-10-CM | POA: Diagnosis not present

## 2018-02-18 ENCOUNTER — Telehealth: Payer: Self-pay | Admitting: Cardiovascular Disease

## 2018-02-18 NOTE — Telephone Encounter (Signed)
LEFT MESSAGE TO CALL BACK

## 2018-02-18 NOTE — Telephone Encounter (Signed)
New Message:   Pt call to report that he fell

## 2018-02-19 NOTE — Telephone Encounter (Signed)
It's up to him when he wants it to be done.  As we discussed in the clinic, the sooner we find out what his rhythm is during the spells, the quicker we are able to help him.   Really do not anticipate any complications with loop recorder implantation, the only limitation will be he cannot get in the water (which I suspect he will not be doing in [place] anyway).

## 2018-02-19 NOTE — Telephone Encounter (Signed)
SPOKE TO PATIENT.AWARE TO CONTACT OFFICE WHEN HE RETURNS FROM TRIP.  PATIENT ALSO HAD QUESTION  IF HE DOES RECEIVE A LOOP RECORDER OR IF HE NEEDS PACEMAKER --HE IS CONCERNED ABOUT HAVING SCAN DONE TO  FOLLOW UP ON LUNG CANCER. HE STATES HE WIIL DISCUSS WITH ONCOLOGIST. RN  INFORMED PATIENT TO DISCUSS WITH DR C. AND ONCOLOGIST . THERE ARE NEWER EQUIPMENT THAT CAN BE USED THAT WANT INTERFERE WITH CT OR MRI SCANS.  PATIENT STATES HE WILL CALL OFFICE WHEN HE RETURNS.

## 2018-02-19 NOTE — Telephone Encounter (Signed)
Spoke to patient.  patient states he think he is ready to monitor implanted. Patient states he would like to wait because he is going on a 2 week trip to [place]. Patient stated he wanted to in town after  Procedure just incase there is any complication.  Patient states he had an episode on WEDNESDAY night. - drove into [place] @ 5:30 pm  -- @6 :30 pm start feeling bad before dinner  it lasted to about 9:30 pm  ( did not wash dishes_ that never happens) felt "cold"  Wife instructed him to go to bed - and he did. -- patient states he woke up @ 10:30 -got up to go the bathroom- patient states he "fell " found hisself on the floor- came too- Patient states he has never felt like this after a syncopal episode - "I FELT VERY EUPHORIC , I DIDN'T WANT TO GET OFF THE FLOOR" all my early symptoms went away.  Patient aware will defer to Dr C.and someone will call patient back.

## 2018-03-18 DIAGNOSIS — H26492 Other secondary cataract, left eye: Secondary | ICD-10-CM | POA: Diagnosis not present

## 2018-03-26 ENCOUNTER — Other Ambulatory Visit: Payer: Self-pay | Admitting: *Deleted

## 2018-03-26 DIAGNOSIS — C3490 Malignant neoplasm of unspecified part of unspecified bronchus or lung: Secondary | ICD-10-CM

## 2018-04-07 ENCOUNTER — Telehealth: Payer: Self-pay | Admitting: *Deleted

## 2018-04-07 DIAGNOSIS — I10 Essential (primary) hypertension: Secondary | ICD-10-CM | POA: Diagnosis not present

## 2018-04-07 DIAGNOSIS — R82998 Other abnormal findings in urine: Secondary | ICD-10-CM | POA: Diagnosis not present

## 2018-04-07 DIAGNOSIS — Z125 Encounter for screening for malignant neoplasm of prostate: Secondary | ICD-10-CM | POA: Diagnosis not present

## 2018-04-07 DIAGNOSIS — E7849 Other hyperlipidemia: Secondary | ICD-10-CM | POA: Diagnosis not present

## 2018-04-07 NOTE — Telephone Encounter (Signed)
On 04-07-18 pt call me back to r/s appt 08-12-18, cancel appt 08-19-18 Dr. Sondra Come will be out of the office

## 2018-04-07 NOTE — Telephone Encounter (Signed)
On 04-07-18 lvm for call back to r/s appt from 08-19-18

## 2018-04-20 DIAGNOSIS — I2510 Atherosclerotic heart disease of native coronary artery without angina pectoris: Secondary | ICD-10-CM | POA: Diagnosis not present

## 2018-04-20 DIAGNOSIS — I10 Essential (primary) hypertension: Secondary | ICD-10-CM | POA: Diagnosis not present

## 2018-04-20 DIAGNOSIS — Z6828 Body mass index (BMI) 28.0-28.9, adult: Secondary | ICD-10-CM | POA: Diagnosis not present

## 2018-04-20 DIAGNOSIS — Z0000 Encounter for general adult medical examination without abnormal findings: Secondary | ICD-10-CM | POA: Diagnosis not present

## 2018-04-20 DIAGNOSIS — E7849 Other hyperlipidemia: Secondary | ICD-10-CM | POA: Diagnosis not present

## 2018-04-20 DIAGNOSIS — R197 Diarrhea, unspecified: Secondary | ICD-10-CM | POA: Diagnosis not present

## 2018-04-20 DIAGNOSIS — D7589 Other specified diseases of blood and blood-forming organs: Secondary | ICD-10-CM | POA: Diagnosis not present

## 2018-04-20 DIAGNOSIS — Z1389 Encounter for screening for other disorder: Secondary | ICD-10-CM | POA: Diagnosis not present

## 2018-04-20 DIAGNOSIS — H8102 Meniere's disease, left ear: Secondary | ICD-10-CM | POA: Diagnosis not present

## 2018-04-20 DIAGNOSIS — M545 Low back pain: Secondary | ICD-10-CM | POA: Diagnosis not present

## 2018-04-20 DIAGNOSIS — R42 Dizziness and giddiness: Secondary | ICD-10-CM | POA: Diagnosis not present

## 2018-04-20 DIAGNOSIS — H903 Sensorineural hearing loss, bilateral: Secondary | ICD-10-CM | POA: Diagnosis not present

## 2018-04-20 DIAGNOSIS — R55 Syncope and collapse: Secondary | ICD-10-CM | POA: Diagnosis not present

## 2018-04-27 DIAGNOSIS — Z1212 Encounter for screening for malignant neoplasm of rectum: Secondary | ICD-10-CM | POA: Diagnosis not present

## 2018-05-05 ENCOUNTER — Encounter: Payer: Self-pay | Admitting: Surgery

## 2018-05-05 ENCOUNTER — Ambulatory Visit: Payer: PPO | Admitting: Surgery

## 2018-05-05 ENCOUNTER — Other Ambulatory Visit: Payer: Self-pay

## 2018-05-05 ENCOUNTER — Ambulatory Visit
Admission: RE | Admit: 2018-05-05 | Discharge: 2018-05-05 | Disposition: A | Discharge status: Home / Self Care | Payer: PPO | Source: Ambulatory Visit | Attending: Surgery | Admitting: Surgery

## 2018-05-05 VITALS — BP 153/82 | HR 65 | Resp 16 | Ht 66.0 in | Wt 174.0 lb

## 2018-05-05 DIAGNOSIS — Z902 Acquired absence of lung [part of]: Secondary | ICD-10-CM | POA: Diagnosis not present

## 2018-05-05 DIAGNOSIS — C3412 Malignant neoplasm of upper lobe, left bronchus or lung: Secondary | ICD-10-CM | POA: Diagnosis not present

## 2018-05-05 DIAGNOSIS — J439 Emphysema, unspecified: Secondary | ICD-10-CM | POA: Diagnosis not present

## 2018-05-05 DIAGNOSIS — C3490 Malignant neoplasm of unspecified part of unspecified bronchus or lung: Secondary | ICD-10-CM

## 2018-05-05 NOTE — Progress Notes (Signed)
HPI:  The patient returns for follow up s/p left upper lobectomy for a pT1b, pN0 stage IA well differentiated adenocarcinoma on 05/21/2015. Hedid not have afissure between the upper and lower lobes and the stapled resection margin was positive. He was not a candidate for a completion pneumonectomy and received XRT to 66 gray in 37 fractionsby Dr. Alcide Goodness on 08/16/2015. Hecontinues to feelwell and has continued to walk and playgolf. He says that the only time that he has noticed some dyspnea was with walking a long distance.He has been eating well and maintaining his weight. He denies headaches, visual changes, hemoptysis, bonepain.  He has had 2 episodes recently when he got up out of bed in the middle of night to go the bathroom and passed out.  This is being worked up by cardiology.  Current Outpatient Medications  Medication Sig Dispense Refill  . Calcium Carb-Cholecalciferol (CALCIUM 600 + D PO) Take 600 mg by mouth daily.    . carvedilol (COREG) 6.25 MG tablet Take 1 tablet (6.25 mg total) by mouth 2 (two) times daily. 60 tablet 6  . ibuprofen (ADVIL,MOTRIN) 200 MG tablet Take 200 mg by mouth every 6 (six) hours as needed for mild pain or moderate pain.    . Multiple Vitamin (MULTIVITAMIN WITH MINERALS) TABS tablet Take 2 tablets by mouth daily.    . potassium chloride SA (K-DUR,KLOR-CON) 20 MEQ tablet     . rosuvastatin (CRESTOR) 10 MG tablet Take 5 mg by mouth daily. In AM    . telmisartan-hydrochlorothiazide (MICARDIS HCT) 40-12.5 MG tablet Take 1 tablet by mouth daily. 30 tablet 6   Current Facility-Administered Medications  Medication Dose Route Frequency Provider Last Rate Last Dose  . 0.9 %  sodium chloride infusion  500 mL Intravenous Continuous Armbruster, Carlota Raspberry, MD         Physical Exam: BP (!) 153/82 (BP Location: Right Arm, Patient Position: Sitting, Cuff Size: Large)   Pulse 65   Resp 16   Ht 5\' 6"  (1.676 m)   Wt 174 lb (78.9 kg)   SpO2 98%  Comment: ON RA  BMI 28.08 kg/m  He looks well. There is no cervical or supraclavicular adenopathy. Cardiac exam shows a regular rate and rhythm with normal heart sounds. Lung exam is clear. The left chest scar looks fine.  Diagnostic Tests:  CLINICAL DATA:  Left upper lobe lung cancer with surgery and radiation therapy in November 2013. History of melanoma.  EXAM: CT CHEST WITHOUT CONTRAST  TECHNIQUE: Multidetector CT imaging of the chest was performed following the standard protocol without IV contrast.  COMPARISON:  Chest CT 11/11/2017 and 04/29/2017.  FINDINGS: Cardiovascular: There is atherosclerosis of aorta, great vessels and coronary arteries. No acute vascular findings are demonstrated on noncontrast imaging. The heart size is normal. There is no pericardial effusion.  Mediastinum/Nodes: There are no enlarged mediastinal, hilar or axillary lymph nodes.Hilar assessment is limited by the lack of intravenous contrast, although the hilar contours appear unchanged. The thyroid gland, trachea and esophagus demonstrate no significant findings.  Lungs/Pleura: There is no pleural effusion or pneumothorax. There is stable volume loss in the left hemithorax with mild mediastinal shift to the left. Left suprahilar surgical clips from previous upper lobe wedge resection, surrounding opacity, bronchiectasis and scarring are stable. There is a stable subpleural right middle lobe nodule (image 110/8) and a superior segment right lower lobe ground-glass nodule (image 58/8). There is no new airspace disease or suspicious pulmonary nodule. Mild underlying  emphysema is stable.  Upper abdomen: The visualized upper abdomen appears stable without suspicious findings. There is diffuse hepatic steatosis. There are stable simple and hyperdense renal cysts.  Musculoskeletal/Chest wall: There is no chest wall mass or suspicious osseous finding. There are stable degenerative  changes in the spine associated with a mild convex right scoliosis.  IMPRESSION: 1. Stable chest with chronic treatment changes in the left upper lobe. No evidence of local recurrence or metastatic disease. 2. Stable ground-glass opacity in the superior segment of the right lower lobe. Follow-up recommendations previously delineated. 3. Coronary and Aortic Atherosclerosis (ICD10-I70.0). Hepatic steatosis.   Electronically Signed   By: Richardean Sale M.D.   On: 05/05/2018 13:09   Impression:  He has chronic stable changes in the left upper lung with a 2 radiation.  There is no evidence of local recurrence or metastatic disease.  There is a stable groundglass opacity in the superior segment of the right lower lobe.  I have recommended that we continue follow-up every 6 months with a CT of the chest for the first 5 years given his history of resection with a positive resection margin treated with radiation therapy.  I reviewed the CT images with him and answered his questions.  Plan:  I will plan see him back in 6 months with a CT scan of the chest without contrast.   I spent 15 minutes performing this established patient evaluation and > 50% of this time was spent face to face counseling and coordinating the surveillance of this patient's resected lung cancer.    Gaye Pollack, MD [facility] [phone]

## 2018-05-08 ENCOUNTER — Encounter: Payer: Self-pay | Admitting: Surgery

## 2018-06-01 DIAGNOSIS — Z85820 Personal history of malignant melanoma of skin: Secondary | ICD-10-CM | POA: Diagnosis not present

## 2018-06-01 DIAGNOSIS — L570 Actinic keratosis: Secondary | ICD-10-CM | POA: Diagnosis not present

## 2018-06-01 DIAGNOSIS — C44519 Basal cell carcinoma of skin of other part of trunk: Secondary | ICD-10-CM | POA: Diagnosis not present

## 2018-06-01 DIAGNOSIS — D485 Neoplasm of uncertain behavior of skin: Secondary | ICD-10-CM | POA: Diagnosis not present

## 2018-06-01 DIAGNOSIS — L820 Inflamed seborrheic keratosis: Secondary | ICD-10-CM | POA: Diagnosis not present

## 2018-06-01 DIAGNOSIS — Z85828 Personal history of other malignant neoplasm of skin: Secondary | ICD-10-CM | POA: Diagnosis not present

## 2018-06-01 DIAGNOSIS — L111 Transient acantholytic dermatosis [Grover]: Secondary | ICD-10-CM | POA: Diagnosis not present

## 2018-06-01 DIAGNOSIS — L821 Other seborrheic keratosis: Secondary | ICD-10-CM | POA: Diagnosis not present

## 2018-06-03 DIAGNOSIS — I10 Essential (primary) hypertension: Secondary | ICD-10-CM | POA: Diagnosis not present

## 2018-06-09 DIAGNOSIS — R030 Elevated blood-pressure reading, without diagnosis of hypertension: Secondary | ICD-10-CM | POA: Diagnosis not present

## 2018-06-14 ENCOUNTER — Other Ambulatory Visit: Payer: Self-pay | Admitting: Cardiology

## 2018-08-12 ENCOUNTER — Encounter: Payer: Self-pay | Admitting: Radiation Oncology

## 2018-08-12 ENCOUNTER — Other Ambulatory Visit: Payer: Self-pay

## 2018-08-12 ENCOUNTER — Ambulatory Visit
Admission: RE | Admit: 2018-08-12 | Discharge: 2018-08-12 | Disposition: A | Discharge status: Home / Self Care | Payer: PPO | Source: Ambulatory Visit | Attending: Radiation Oncology | Admitting: Radiation Oncology

## 2018-08-12 VITALS — BP 183/93 | HR 62 | Temp 98.7°F | Resp 18 | Ht 67.5 in | Wt 181.2 lb

## 2018-08-12 DIAGNOSIS — Z923 Personal history of irradiation: Secondary | ICD-10-CM | POA: Diagnosis not present

## 2018-08-12 DIAGNOSIS — C3412 Malignant neoplasm of upper lobe, left bronchus or lung: Secondary | ICD-10-CM | POA: Diagnosis not present

## 2018-08-12 DIAGNOSIS — Z08 Encounter for follow-up examination after completed treatment for malignant neoplasm: Secondary | ICD-10-CM | POA: Diagnosis not present

## 2018-08-12 DIAGNOSIS — Z79899 Other long term (current) drug therapy: Secondary | ICD-10-CM | POA: Diagnosis not present

## 2018-08-12 NOTE — Progress Notes (Signed)
Pt presents today for f/u with Dr. Sondra Come. Pt is unaccompanied. Pt denies c/o pain. Pt reports occasional cough with intermittent clear sputum. Pt denies hemoptysis. Pt reports SOB with exertion, especially [place]. Pt denies difficulty swallowing. Pt reports having a cold.   BP (!) 183/93 (BP Location: Left Arm, Patient Position: Sitting)   Pulse 62   Temp 98.7 F (37.1 C) (Oral)   Resp 18   Ht 5' 7.5" (1.715 m)   Wt 181 lb 4 oz (82.2 kg)   SpO2 99%   BMI 27.97 kg/m   Wt Readings from Last 3 Encounters:  08/12/18 181 lb 4 oz (82.2 kg)  05/05/18 174 lb (78.9 kg)  02/15/18 180 lb (81.6 kg)   Loma Sousa, RN BSN

## 2018-08-12 NOTE — Progress Notes (Signed)
Radiation Oncology         [phone]) [phone] ________________________________j  Name: George Montoya. MRN: [id number]  Date: 08/12/2018  DOB: 1941/06/14  Follow-Up Visit Note  CC: Leanna Battles, MD  Gaye Pollack, MD    ICD-10-CM   1. Primary cancer of left upper lobe of lung (HCC) C34.12     Diagnosis:   78 y.o. male with Stage IB Well-differentiated adenocarcinoma of the left lung with positive surgical margins   Interval Since Last Radiation:  3 years Radiation treatment dates:   07/03/2015-08/16/2015 Site/dose:   Left mid chest, surgical clips / 66 gray in 33 fractions  Narrative:  The patient returns today for routine follow-up. His most recent CT scan of the chest, dated 05/05/2018, showed stable chest with chronic treatment changes in the left upper lobe. No evidence of local recurrence or metastatic disease.     Since his last visit, he has had two episodes when he got up out of bed in the middle of the night to go to the bathroom and passed out. This was  worked up by cardiology. He reports the dose of his blood pressure medication has been increased.  On review of systems, the patient denies any pain. He reports occasional cough with intermittent clear sputum. He denies hemoptysis. He reports shortness of breath with exertion, especially [place]. He denies difficulty swallowing. He reports waking up with a cold this morning.   ALLERGIES:  has No Known Allergies.  Meds: Current Outpatient Medications  Medication Sig Dispense Refill  . Calcium Carb-Cholecalciferol (CALCIUM 600 + D PO) Take 600 mg by mouth daily.    . carvedilol (COREG) 6.25 MG tablet Take 1 tablet (6.25 mg total) by mouth 2 (two) times daily. 60 tablet 6  . ibuprofen (ADVIL,MOTRIN) 200 MG tablet Take 200 mg by mouth every 6 (six) hours as needed for mild pain or moderate pain.    . Multiple Vitamin (MULTIVITAMIN WITH MINERALS) TABS tablet Take 2 tablets by mouth daily.    .  potassium chloride SA (K-DUR,KLOR-CON) 20 MEQ tablet 20 mEq 2 (two) times daily.     . rosuvastatin (CRESTOR) 10 MG tablet Take 5 mg by mouth daily. In AM    . telmisartan-hydrochlorothiazide (MICARDIS HCT) 40-12.5 MG tablet Take 1 tablet by mouth daily. 30 tablet 8   Current Facility-Administered Medications  Medication Dose Route Frequency Provider Last Rate Last Dose  . 0.9 %  sodium chloride infusion  500 mL Intravenous Continuous Armbruster, Carlota Raspberry, MD        Physical Findings: The patient is in no acute distress. Patient is alert and oriented.  height is 5' 7.5" (1.715 m) and weight is 181 lb 4 oz (82.2 kg). His oral temperature is 98.7 F (37.1 C). His blood pressure is 183/93 (abnormal) and his pulse is 62. His respiration is 18 and oxygen saturation is 99%.   Lungs have mild wheezing likely due to recent development of upper respiratory illness. Heart has regular rate and rhythm. Thoracotomy scar is well-healed without any signs of recurrence. No palpable cervical, supraclavicular, or axillary adenopathy. Abdomen soft, non-tender, normal bowel sounds.   Lab Findings: Lab Results  Component Value Date   WBC 5.1 10/09/2017   HGB 14.6 10/09/2017   HCT 43.0 10/09/2017   MCV 100.8 (H) 10/09/2017   PLT 197 10/09/2017    Radiographic Findings: No results found.  Impression:  Stage IB Well-differentiated adenocarcinoma of the left lung with  positive surgical margins. No evidence of recurrence on clinical exam or imaging.  Plan:  Follow up with Dr. Cyndia Bent in 3 months and will have a chest CT scan at that time. Routine follow-up in radiation oncology in 6 months.  ____________________________________  Blair Promise, PhD, MD  This document serves as a record of services personally performed by Gery Pray, MD. It was created on his behalf by Rae Lips, a trained medical scribe. The creation of this record is based on the scribe's personal observations and the provider's  statements to them. This document has been checked and approved by the attending provider.

## 2018-08-16 DIAGNOSIS — J3081 Allergic rhinitis due to animal (cat) (dog) hair and dander: Secondary | ICD-10-CM | POA: Diagnosis not present

## 2018-08-16 DIAGNOSIS — J3089 Other allergic rhinitis: Secondary | ICD-10-CM | POA: Diagnosis not present

## 2018-08-18 DIAGNOSIS — H903 Sensorineural hearing loss, bilateral: Secondary | ICD-10-CM | POA: Diagnosis not present

## 2018-08-18 DIAGNOSIS — H8102 Meniere's disease, left ear: Secondary | ICD-10-CM | POA: Diagnosis not present

## 2018-08-18 DIAGNOSIS — H6122 Impacted cerumen, left ear: Secondary | ICD-10-CM | POA: Diagnosis not present

## 2018-08-19 ENCOUNTER — Ambulatory Visit: Payer: Self-pay | Admitting: Radiation Oncology

## 2018-09-20 ENCOUNTER — Other Ambulatory Visit: Payer: Self-pay | Admitting: Surgery

## 2018-09-20 DIAGNOSIS — C3412 Malignant neoplasm of upper lobe, left bronchus or lung: Secondary | ICD-10-CM

## 2018-09-20 DIAGNOSIS — C3490 Malignant neoplasm of unspecified part of unspecified bronchus or lung: Secondary | ICD-10-CM

## 2018-09-30 DIAGNOSIS — S8410XA Injury of peroneal nerve at lower leg level, unspecified leg, initial encounter: Secondary | ICD-10-CM | POA: Diagnosis not present

## 2018-09-30 DIAGNOSIS — M79661 Pain in right lower leg: Secondary | ICD-10-CM | POA: Diagnosis not present

## 2018-09-30 DIAGNOSIS — Z79899 Other long term (current) drug therapy: Secondary | ICD-10-CM | POA: Diagnosis not present

## 2018-09-30 DIAGNOSIS — I4510 Unspecified right bundle-branch block: Secondary | ICD-10-CM | POA: Diagnosis not present

## 2018-09-30 DIAGNOSIS — I10 Essential (primary) hypertension: Secondary | ICD-10-CM | POA: Diagnosis not present

## 2018-09-30 DIAGNOSIS — N5089 Other specified disorders of the male genital organs: Secondary | ICD-10-CM | POA: Diagnosis not present

## 2018-09-30 DIAGNOSIS — M25572 Pain in left ankle and joints of left foot: Secondary | ICD-10-CM | POA: Diagnosis not present

## 2018-09-30 DIAGNOSIS — N401 Enlarged prostate with lower urinary tract symptoms: Secondary | ICD-10-CM | POA: Diagnosis not present

## 2018-09-30 DIAGNOSIS — H9121 Sudden idiopathic hearing loss, right ear: Secondary | ICD-10-CM | POA: Diagnosis not present

## 2018-09-30 DIAGNOSIS — M25871 Other specified joint disorders, right ankle and foot: Secondary | ICD-10-CM | POA: Diagnosis not present

## 2018-09-30 DIAGNOSIS — H8103 Meniere's disease, bilateral: Secondary | ICD-10-CM | POA: Diagnosis not present

## 2018-09-30 DIAGNOSIS — E785 Hyperlipidemia, unspecified: Secondary | ICD-10-CM | POA: Diagnosis not present

## 2018-09-30 DIAGNOSIS — N50811 Right testicular pain: Secondary | ICD-10-CM | POA: Diagnosis not present

## 2018-10-08 DIAGNOSIS — H9121 Sudden idiopathic hearing loss, right ear: Secondary | ICD-10-CM | POA: Diagnosis not present

## 2018-10-15 DIAGNOSIS — H9121 Sudden idiopathic hearing loss, right ear: Secondary | ICD-10-CM | POA: Diagnosis not present

## 2018-11-23 ENCOUNTER — Other Ambulatory Visit: Payer: Self-pay

## 2018-11-23 DIAGNOSIS — H903 Sensorineural hearing loss, bilateral: Secondary | ICD-10-CM | POA: Diagnosis not present

## 2018-11-23 DIAGNOSIS — H8103 Meniere's disease, bilateral: Secondary | ICD-10-CM | POA: Diagnosis not present

## 2018-11-23 DIAGNOSIS — H7201 Central perforation of tympanic membrane, right ear: Secondary | ICD-10-CM | POA: Diagnosis not present

## 2018-11-24 ENCOUNTER — Encounter: Payer: Self-pay | Admitting: Surgery

## 2018-11-24 ENCOUNTER — Ambulatory Visit
Admission: RE | Admit: 2018-11-24 | Discharge: 2018-11-24 | Disposition: A | Discharge status: Home / Self Care | Payer: PPO | Source: Ambulatory Visit | Attending: Surgery | Admitting: Surgery

## 2018-11-24 ENCOUNTER — Ambulatory Visit: Payer: PPO | Admitting: Surgery

## 2018-11-24 VITALS — BP 154/88 | HR 64 | Temp 97.7°F | Resp 16 | Ht 72.0 in | Wt 177.0 lb

## 2018-11-24 DIAGNOSIS — C3412 Malignant neoplasm of upper lobe, left bronchus or lung: Secondary | ICD-10-CM | POA: Diagnosis not present

## 2018-11-24 DIAGNOSIS — Z902 Acquired absence of lung [part of]: Secondary | ICD-10-CM

## 2018-11-24 DIAGNOSIS — C3490 Malignant neoplasm of unspecified part of unspecified bronchus or lung: Secondary | ICD-10-CM

## 2018-11-24 NOTE — Progress Notes (Signed)
HPI:  The patient returns for follow up s/p left upper lobectomy for a pT1b, pN0 stage IA well differentiated adenocarcinoma on 05/21/2015. Hedid not have afissure between the upper and lower lobes and the stapled resection margin was positive. He was not a candidate for a completion pneumonectomy and received XRT to 66 gray in 73 fractionsby Dr. Alcide Goodness on 08/16/2015.  He last saw Dr. Sofie Hartigan in February 2020.  He said that he had just developed a cold at that time which lasted for about 6 weeks.  He said that he did not have a fever but did have upper respiratory symptoms that were fairly bad.  He did develop some shortness of breath.  He feels like he is improved now but not back to normal yet.  He has quarantined himself since the coronavirus pandemic started due to his high risk for pulmonary problems.  Current Outpatient Medications  Medication Sig Dispense Refill  . Calcium Carb-Cholecalciferol (CALCIUM 600 + D PO) Take 600 mg by mouth daily.    . carvedilol (COREG) 6.25 MG tablet Take 1 tablet (6.25 mg total) by mouth 2 (two) times daily. 60 tablet 6  . ibuprofen (ADVIL,MOTRIN) 200 MG tablet Take 200 mg by mouth every 6 (six) hours as needed for mild pain or moderate pain.    . Multiple Vitamin (MULTIVITAMIN WITH MINERALS) TABS tablet Take 2 tablets by mouth daily.    . potassium chloride SA (K-DUR,KLOR-CON) 20 MEQ tablet 20 mEq 2 (two) times daily.     . rosuvastatin (CRESTOR) 10 MG tablet Take 5 mg by mouth daily. In AM    . telmisartan-hydrochlorothiazide (MICARDIS HCT) 40-12.5 MG tablet Take 1 tablet by mouth daily. 30 tablet 8   Current Facility-Administered Medications  Medication Dose Route Frequency Provider Last Rate Last Dose  . 0.9 %  sodium chloride infusion  500 mL Intravenous Continuous Armbruster, Carlota Raspberry, MD         Physical Exam: BP (!) 154/88 (BP Location: Right Arm, Patient Position: Sitting, Cuff Size: Large)   Pulse 64   Temp 97.7 F (36.5 C)  (Skin)   Resp 16   Ht 6' (1.829 m)   Wt 177 lb (80.3 kg)   SpO2 98% Comment: ON RA  BMI 24.01 kg/m  He looks well. There is no cervical or supraclavicular adenopathy. Lungs are clear. Cardiac exam shows a regular rate and rhythm with normal heart sounds.  Diagnostic Tests:  CLINICAL DATA:  Followup lung cancer.  EXAM: CT CHEST WITHOUT CONTRAST  TECHNIQUE: Multidetector CT imaging of the chest was performed following the standard protocol without IV contrast.  COMPARISON:  05/05/2018  FINDINGS: Cardiovascular: Normal heart size. No pericardial effusion. Aortic atherosclerosis. Calcification in the left main, LAD, left circumflex and RCA coronary arteries noted.  Mediastinum/Nodes: Normal appearance of the thyroid gland. The trachea appears patent and is midline. Normal appearance of the esophagus. No mediastinal or hilar adenopathy. No supraclavicular or axillary adenopathy.  Lungs/Pleura: No free fluid or fluid collections. Postsurgical changes with surrounding masslike architectural distortion and fibrosis involving the left upper lobe are again noted. The appearance is stable when compared with the previous exam. Stable right middle lobe subpleural nodule measuring 4 mm. The previously noted sub solid nodule within the superior segment of right lower lobe is not confidently identified on today's exam. No new or enlarging suspicious pulmonary nodules or masses.  Upper Abdomen: Hepatic steatosis noted. Bilateral kidney cysts of varying density are identified. These are incompletely  characterized without IV contrast.  Musculoskeletal: Spondylosis noted within the thoracic spine. No aggressive lytic or sclerotic bone lesions identified.  IMPRESSION: 1. Stable chest with chronic post treatment changes in the left upper lobe. No findings to suggest local tumor recurrence or metastatic disease. 2. Hepatic steatosis 3. Aortic Atherosclerosis (ICD10-I70.0).  Coronary artery calcifications.   Electronically Signed   By: Kerby Moors M.D.   On: 11/24/2018 12:00   Impression:  Overall he continues to do well 3 and half years following lung cancer resection and postoperative radiation therapy.  His CT scan shows chronic posttreatment changes in the superior segment of the remaining left lower lobe from radiation therapy but there are no findings to suggest local tumor recurrence or metastatic disease.  I reviewed the CT images with him and answered his questions.  Plan:  He has a follow-up appointment in a couple months with Dr. Sofie Hartigan.  I will plan to see him back in 6 months with a CT scan of the chest.   I spent 15 minutes performing this established patient evaluation and > 50% of this time was spent face to face counseling and coordinating the surveillance of this patient's resected lung cancer.    Gaye Pollack, MD [facility] [phone]

## 2018-12-01 DIAGNOSIS — L821 Other seborrheic keratosis: Secondary | ICD-10-CM | POA: Diagnosis not present

## 2018-12-01 DIAGNOSIS — D171 Benign lipomatous neoplasm of skin and subcutaneous tissue of trunk: Secondary | ICD-10-CM | POA: Diagnosis not present

## 2018-12-01 DIAGNOSIS — Z85820 Personal history of malignant melanoma of skin: Secondary | ICD-10-CM | POA: Diagnosis not present

## 2018-12-01 DIAGNOSIS — L570 Actinic keratosis: Secondary | ICD-10-CM | POA: Diagnosis not present

## 2018-12-01 DIAGNOSIS — D1801 Hemangioma of skin and subcutaneous tissue: Secondary | ICD-10-CM | POA: Diagnosis not present

## 2018-12-01 DIAGNOSIS — Z85828 Personal history of other malignant neoplasm of skin: Secondary | ICD-10-CM | POA: Diagnosis not present

## 2018-12-29 ENCOUNTER — Other Ambulatory Visit: Payer: Self-pay

## 2018-12-29 MED ORDER — CARVEDILOL 6.25 MG PO TABS: 6.2500 mg | ORAL_TABLET | Freq: Two times a day (BID) | ORAL | 0 refills | Status: DC

## 2018-12-29 NOTE — Telephone Encounter (Signed)
Rx(s) sent to pharmacy electronically.  

## 2019-01-05 ENCOUNTER — Telehealth: Payer: Self-pay | Admitting: *Deleted

## 2019-01-05 NOTE — Telephone Encounter (Signed)
A message was left, re: rescheduling his office visit.

## 2019-01-25 DIAGNOSIS — H8103 Meniere's disease, bilateral: Secondary | ICD-10-CM | POA: Diagnosis not present

## 2019-01-25 DIAGNOSIS — H903 Sensorineural hearing loss, bilateral: Secondary | ICD-10-CM | POA: Diagnosis not present

## 2019-01-25 DIAGNOSIS — H6122 Impacted cerumen, left ear: Secondary | ICD-10-CM | POA: Diagnosis not present

## 2019-02-10 ENCOUNTER — Ambulatory Visit
Admission: RE | Admit: 2019-02-10 | Discharge: 2019-02-10 | Disposition: A | Discharge status: Home / Self Care | Payer: PPO | Source: Ambulatory Visit | Attending: Radiation Oncology | Admitting: Radiation Oncology

## 2019-02-10 ENCOUNTER — Other Ambulatory Visit: Payer: Self-pay

## 2019-02-10 ENCOUNTER — Encounter: Payer: Self-pay | Admitting: Radiation Oncology

## 2019-02-10 VITALS — BP 153/86 | HR 64 | Temp 99.3°F | Resp 18 | Ht 72.0 in | Wt 187.4 lb

## 2019-02-10 DIAGNOSIS — Z79899 Other long term (current) drug therapy: Secondary | ICD-10-CM | POA: Diagnosis not present

## 2019-02-10 DIAGNOSIS — Z923 Personal history of irradiation: Secondary | ICD-10-CM | POA: Diagnosis not present

## 2019-02-10 DIAGNOSIS — Z85118 Personal history of other malignant neoplasm of bronchus and lung: Secondary | ICD-10-CM | POA: Diagnosis not present

## 2019-02-10 DIAGNOSIS — Z08 Encounter for follow-up examination after completed treatment for malignant neoplasm: Secondary | ICD-10-CM | POA: Diagnosis not present

## 2019-02-10 DIAGNOSIS — C3412 Malignant neoplasm of upper lobe, left bronchus or lung: Secondary | ICD-10-CM | POA: Diagnosis not present

## 2019-02-10 NOTE — Progress Notes (Signed)
Radiation Oncology         [phone]) [phone] ________________________________  Name: George Montoya. MRN: [id number]  Date: 02/10/2019  DOB: 1941-01-31  Follow-Up Visit Note  CC: Leanna Battles, MD  Gaye Pollack, MD    ICD-10-CM   1. Primary cancer of left upper lobe of lung (HCC)  C34.12     Diagnosis:   78 y.o. male with Stage IB Well-differentiated adenocarcinoma of the left lung with positive surgical margins   Interval Since Last Radiation:  3 years and 6 months 07/03/2015 - 08/16/2015: Left mid chest, surgical clips / 66 gray in 33 fractions  Narrative:  The patient returns today for routine follow-up. He last saw Dr. Cyndia Bent on 11/24/2018.  He underwent follow up chest CT that day, which showed: stable chest with chronic post-treatment changes in the left upper lobe; no findings to suggest local tumor recurrence or metastatic disease.  He will see Dr. Cyndia Bent and undergo another chest CT scan in 05/2019.  On review of systems, he reports occasional wheezing, which is relieved with coughing. He also reports a cough that is sometimes productive. He in unsure of the color of the sputum. He denies pain, hemoptysis, and any difficulty swallowing. [place] risk, but this causes shortness of breath due to the elevation.   ALLERGIES:  has No Known Allergies.  Meds: Current Outpatient Medications  Medication Sig Dispense Refill  . Calcium Carb-Cholecalciferol (CALCIUM 600 + D PO) Take 600 mg by mouth daily.    . carvedilol (COREG) 6.25 MG tablet Take 1 tablet (6.25 mg total) by mouth 2 (two) times daily. 180 tablet 0  . ibuprofen (ADVIL,MOTRIN) 200 MG tablet Take 200 mg by mouth every 6 (six) hours as needed for mild pain or moderate pain.    . Multiple Vitamin (MULTIVITAMIN WITH MINERALS) TABS tablet Take 2 tablets by mouth daily.    . potassium chloride SA (K-DUR,KLOR-CON) 20 MEQ tablet 20 mEq 2 (two) times daily.      . rosuvastatin (CRESTOR) 10 MG tablet Take 5 mg by mouth daily. In AM    . telmisartan-hydrochlorothiazide (MICARDIS HCT) 40-12.5 MG tablet Take 1 tablet by mouth daily. 30 tablet 8   Current Facility-Administered Medications  Medication Dose Route Frequency Provider Last Rate Last Dose  . 0.9 %  sodium chloride infusion  500 mL Intravenous Continuous Armbruster, Carlota Raspberry, MD        Physical Findings: The patient is in no acute distress. Patient is alert and oriented.  height is 6' (1.829 m) and weight is 187 lb 6 oz (85 kg). His temporal temperature is 99.3 F (37.4 C). His blood pressure is 153/86 (abnormal) and his pulse is 64. His respiration is 18 and oxygen saturation is 98%.   The patient   mild wheezing with deep inspiration on the left side.  Right lung clear. heart has regular rate and rhythm. Thoracotomy scar is well-healed without any signs of recurrence. No palpable cervical, supraclavicular, or axillary adenopathy. Abdomen soft, non-tender, normal bowel sounds.   Lab Findings: Lab Results  Component Value Date   WBC 5.1 10/09/2017   HGB 14.6 10/09/2017   HCT 43.0 10/09/2017   MCV 100.8 (H) 10/09/2017   PLT 197 10/09/2017    Radiographic Findings: No results found.  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  Status post postop radiation therapy. No evidence of recurrence on  clinical exam or recent imaging.  Plan:  Follow up with Dr. Cyndia Bent in 3 months and will have a chest CT scan at that time. Routine follow-up in radiation oncology in 6 months.  ____________________________________  Blair Promise, PhD, MD  This document serves as a record of services personally performed by Gery Pray, MD. It was created on his behalf by Wilburn Mylar, a trained medical scribe. The creation of this record is based on the scribe's personal observations and the provider's statements to them. This document has  been checked and approved by the attending provider.

## 2019-02-10 NOTE — Progress Notes (Signed)
Pt presents today for f/u with Dr. Sondra Come. Pt denies c/o pain. Pt with occasional wheeze that pt relieves with coughing. Pt reports cough will "sometimes" produce sputum, unknown color. Pt denies hemoptysis. Pt denies difficulty swallowing. Pt reports SOB while [place] the [place] due [place]. Pt [place] the [place] due to Covid risk.  BP (!) 153/86 (BP Location: Left Arm, Patient Position: Sitting)   Pulse 64   Temp 99.3 F (37.4 C) (Temporal)   Resp 18   Ht 6' (1.829 m)   Wt 187 lb 6 oz (85 kg)   SpO2 98%   BMI 25.41 kg/m   Wt Readings from Last 3 Encounters:  02/10/19 187 lb 6 oz (85 kg)  11/24/18 177 lb (80.3 kg)  08/12/18 181 lb 4 oz (82.2 kg)   Loma Sousa, RN BSN

## 2019-02-10 NOTE — Patient Instructions (Signed)
Coronavirus (COVID-19) Are you at risk?  Are you at risk for the Coronavirus (COVID-19)?  To be considered HIGH RISK for Coronavirus (COVID-19), you have to meet the following criteria:  . Traveled to [place], [place], [place], [place] or [place]; or in the [place] to [place], [place], [place], or [place]; and have fever, cough, and shortness of breath within the last 2 weeks of travel OR . Been in close contact with a person diagnosed with COVID-19 within the last 2 weeks and have fever, cough, and shortness of breath . IF YOU DO NOT MEET THESE CRITERIA, YOU ARE CONSIDERED LOW RISK FOR COVID-19.  What to do if you are HIGH RISK for COVID-19?  . If you are having a medical emergency, call 911. . Seek medical care right away. Before you go to a doctor's office, urgent care or emergency department, call ahead and tell them about your recent travel, contact with someone diagnosed with COVID-19, and your symptoms. You should receive instructions from your physician's office regarding next steps of care.  . When you arrive at healthcare provider, tell the healthcare staff immediately you have returned from visiting [place], [place], [place], [place] or [place]; or traveled in the [place] to [place], [place], [place], or [place]; in the last two weeks or you have been in close contact with a person diagnosed with COVID-19 in the last 2 weeks.   . Tell the health care staff about your symptoms: fever, cough and shortness of breath. . After you have been seen by a medical provider, you will be either: o Tested for (COVID-19) and discharged home on quarantine except to seek medical care if symptoms worsen, and asked to  - Stay home and avoid contact with others until you get your results (4-5 days)  - Avoid travel on public transportation if possible (such as bus, train, or airplane) or o Sent to the Emergency Department by EMS for evaluation, COVID-19 testing, and possible  admission depending on your condition and test results.  What to do if you are LOW RISK for COVID-19?  Reduce your risk of any infection by using the same precautions used for avoiding the common cold or flu:  . Wash your hands often with soap and warm water for at least 20 seconds.  If soap and water are not readily available, use an alcohol-based hand sanitizer with at least 60% alcohol.  . If coughing or sneezing, cover your mouth and nose by coughing or sneezing into the elbow areas of your shirt or coat, into a tissue or into your sleeve (not your hands). . Avoid shaking hands with others and consider head nods or verbal greetings only. . Avoid touching your eyes, nose, or mouth with unwashed hands.  . Avoid close contact with people who are sick. . Avoid places or events with large numbers of people in one location, like concerts or sporting events. . Carefully consider travel plans you have or are making. . If you are planning any travel outside or inside the [place], visit the CDC's Travelers' Health webpage for the latest health notices. . If you have some symptoms but not all symptoms, continue to monitor at home and seek medical attention if your symptoms worsen. . If you are having a medical emergency, call 911.   ADDITIONAL HEALTHCARE OPTIONS FOR PATIENTS  [facility] / e-Visit: [email or web address]         [facility]: [phone]  [facility]: [phone]                   [facility]: [phone]   

## 2019-03-02 ENCOUNTER — Ambulatory Visit: Payer: PPO | Admitting: Cardiovascular Disease

## 2019-03-02 ENCOUNTER — Telehealth: Payer: Self-pay | Admitting: Cardiovascular Disease

## 2019-03-02 NOTE — Telephone Encounter (Signed)
New Message    Patient is calling because he had to have his appt rescheduled to 9/10 but he is not able to come in that day. He would like to be worked in on Dr. Sallyanne Kuster schedule another day. Please call.

## 2019-03-02 NOTE — Telephone Encounter (Signed)
Left a message for the patient to call back.  

## 2019-03-02 NOTE — Telephone Encounter (Signed)
The patient has been added to Friday 03/04/2019 as a virtual visit.      Virtual Visit Pre-Appointment Phone Call  "(Name), I am calling you today to discuss your upcoming appointment. We are currently trying to limit exposure to the virus that causes COVID-19 by seeing patients at home rather than in the office."  1. "What is the BEST phone number to call the day of the visit?" - include this in appointment notes  2. "Do you have or have access to (through a family member/friend) a smartphone with video capability that we can use for your visit?" a. If yes - list this number in appt notes as "cell" (if different from BEST phone #) and list the appointment type as a VIDEO visit in appointment notes b. If no - list the appointment type as a PHONE visit in appointment notes  3. Confirm consent - "In the setting of the current Covid19 crisis, you are scheduled for a (phone or video) visit with your provider on (date) at (time).  Just as we do with many in-office visits, in order for you to participate in this visit, we must obtain consent.  If you'd like, I can send this to your mychart (if signed up) or email for you to review.  Otherwise, I can obtain your verbal consent now.  All virtual visits are billed to your insurance company just like a normal visit would be.  By agreeing to a virtual visit, we'd like you to understand that the technology does not allow for your provider to perform an examination, and thus may limit your provider's ability to fully assess your condition. If your provider identifies any concerns that need to be evaluated in person, we will make arrangements to do so.  Finally, though the technology is pretty good, we cannot assure that it will always work on either your or our end, and in the setting of a video visit, we may have to convert it to a phone-only visit.  In either situation, we cannot ensure that we have a secure connection.  Are you willing to proceed?" Yes  4.  Advise patient to be prepared - "Two hours prior to your appointment, go ahead and check your blood pressure, pulse, oxygen saturation, and your weight (if you have the equipment to check those) and write them all down. When your visit starts, your provider will ask you for this information. If you have an Apple Watch or Kardia device, please plan to have heart rate information ready on the day of your appointment. Please have a pen and paper handy nearby the day of the visit as well."  5. Give patient instructions for MyChart download to smartphone OR [email or web address] as below if video visit (depending on what platform provider is using)  6. Inform patient they will receive a phone call 15 minutes prior to their appointment time (may be from unknown caller ID) so they should be prepared to answer    George Montoya. has been deemed a candidate for a follow-up tele-health visit to limit community exposure during the Covid-19 pandemic. I spoke with the patient via phone to ensure availability of phone/video source, confirm preferred email & phone number, and discuss instructions and expectations.  I reminded George Montoya. to be prepared with any vital sign and/or heart rhythm information that could potentially be obtained via home monitoring, at the time of his visit. I reminded George Montoya. to expect  a phone call prior to his visit.  George Barker, RN 03/02/2019 12:16 PM     FULL LENGTH CONSENT FOR TELE-HEALTH VISIT   I hereby voluntarily request, consent and authorize [facility] and its employed or contracted physicians, physician assistants, nurse practitioners or other licensed health care professionals (the Practitioner), to provide me with telemedicine health care services (the "Services") as deemed necessary by the treating Practitioner. I acknowledge and consent to receive the Services by the Practitioner via telemedicine. I understand that the  telemedicine visit will involve communicating with the Practitioner through live audiovisual communication technology and the disclosure of certain medical information by electronic transmission. I acknowledge that I have been given the opportunity to request an in-person assessment or other available alternative prior to the telemedicine visit and am voluntarily participating in the telemedicine visit.  I understand that I have the right to withhold or withdraw my consent to the use of telemedicine in the course of my care at any time, without affecting my right to future care or treatment, and that the Practitioner or I may terminate the telemedicine visit at any time. I understand that I have the right to inspect all information obtained and/or recorded in the course of the telemedicine visit and may receive copies of available information for a reasonable fee.  I understand that some of the potential risks of receiving the Services via telemedicine include:  Marland Kitchen Delay or interruption in medical evaluation due to technological equipment failure or disruption; . Information transmitted may not be sufficient (e.g. poor resolution of images) to allow for appropriate medical decision making by the Practitioner; and/or  . In rare instances, security protocols could fail, causing a breach of personal health information.  Furthermore, I acknowledge that it is my responsibility to provide information about my medical history, conditions and care that is complete and accurate to the best of my ability. I acknowledge that Practitioner's advice, recommendations, and/or decision may be based on factors not within their control, such as incomplete or inaccurate data provided by me or distortions of diagnostic images or specimens that may result from electronic transmissions. I understand that the practice of medicine is not an exact science and that Practitioner makes no warranties or guarantees regarding treatment  outcomes. I acknowledge that I will receive a copy of this consent concurrently upon execution via email to the email address I last provided but may also request a printed copy by calling the office of [facility].    I understand that my insurance will be billed for this visit.   I have read or had this consent read to me. . I understand the contents of this consent, which adequately explains the benefits and risks of the Services being provided via telemedicine.  . I have been provided ample opportunity to ask questions regarding this consent and the Services and have had my questions answered to my satisfaction. . I give my informed consent for the services to be provided through the use of telemedicine in my medical care  By participating in this telemedicine visit I agree to the above.

## 2019-03-03 ENCOUNTER — Ambulatory Visit: Payer: PPO | Admitting: Cardiovascular Disease

## 2019-03-04 ENCOUNTER — Encounter: Payer: Self-pay | Admitting: Cardiovascular Disease

## 2019-03-04 ENCOUNTER — Telehealth: Payer: Self-pay | Admitting: *Deleted

## 2019-03-04 ENCOUNTER — Telehealth (INDEPENDENT_AMBULATORY_CARE_PROVIDER_SITE_OTHER): Payer: PPO | Admitting: Cardiovascular Disease

## 2019-03-04 DIAGNOSIS — R55 Syncope and collapse: Secondary | ICD-10-CM | POA: Diagnosis not present

## 2019-03-04 DIAGNOSIS — E7800 Pure hypercholesterolemia, unspecified: Secondary | ICD-10-CM | POA: Diagnosis not present

## 2019-03-04 DIAGNOSIS — I2510 Atherosclerotic heart disease of native coronary artery without angina pectoris: Secondary | ICD-10-CM | POA: Diagnosis not present

## 2019-03-04 DIAGNOSIS — I10 Essential (primary) hypertension: Secondary | ICD-10-CM | POA: Diagnosis not present

## 2019-03-04 DIAGNOSIS — Z902 Acquired absence of lung [part of]: Secondary | ICD-10-CM

## 2019-03-04 MED ORDER — TELMISARTAN 40 MG PO TABS: 40.0000 mg | ORAL_TABLET | Freq: Every day | ORAL | 3 refills | Status: DC

## 2019-03-04 NOTE — Telephone Encounter (Signed)
The patient has been called and advised on instructions from the virtual visit today. The AVS will be mailed. He has verbalized his understanding.

## 2019-03-04 NOTE — Progress Notes (Signed)
Virtual Visit via Telephone Note   This visit type was conducted due to national recommendations for restrictions regarding the COVID-19 Pandemic (e.g. social distancing) in an effort to limit this patient's exposure and mitigate transmission in our community.  Due to his co-morbid illnesses, this patient is at least at moderate risk for complications without adequate follow up.  This format is felt to be most appropriate for this patient at this time.  The patient did not have access to video technology/had technical difficulties with video requiring transitioning to audio format only (telephone).  All issues noted in this document were discussed and addressed.  No physical exam could be performed with this format.  Please refer to the patient's chart for his  consent to telehealth for [facility].   Date:  03/04/2019   ID:  George Handler., DOB Dec 10, 1940, MRN [id number]  Patient Location: Home Provider Location: Office  PCP:  Leanna Battles, MD  Cardiologist:  Korynne Dols Electrophysiologist:  None   Evaluation Performed:  Follow-Up Visit  Chief Complaint:  Syncope   History of Present Illness:    George Sigl. is a 78 y.o. male with several episodes of syncope.  He is otherwise in fairly good health except for the presence of a relatively mild aneurysm of ascending aorta (4 cm, followed by Dr. Cyndia Bent).  He has well treated hypertension and hyperlipidemia and has Mnire's disease.  Over the last 5 years he has had about 5 syncopal events.  To have occurred in the last year.  Last fall he had syncope when getting up in the middle of the night to use the restroom and was mostly unheralded.  In March of this year also while going to use the restroom he experienced    symptoms of diaphoresis, flushing, poor balance and recognized what was about to happen.  He tried to sit down in a chair but before he could get there he lost consciousness and fell.  He did not have serious  injuries with any of these events.  Most of the episodes have been associated with using the restroom.  Only the first event that occurred in 2015 while at work, but also had a typical prodromal symptoms of a vasovagal event and had been preceded by an acute diarrheal illness.  He has no other cardiovascular complaints.The patient specifically denies any chest pain at rest exertion, dyspnea at rest or with exertion, orthopnea, paroxysmal nocturnal dyspnea, palpitations, focal neurological deficits, intermittent claudication, lower extremity edema, unexplained weight gain, cough, hemoptysis or wheezing.  He has a history of resection of a well-differentiated adenocarcinoma of the left upper lobe of the lung in 2016 followed by radiation therapy.  He has normal left ventricular systolic function by echocardiography.  He is never undergone angiography, but chest CT does show evidence of aortic atherosclerosis as well as calcification in the coronary arteries.  He has never complained of angina pectoris.  The patient does not have symptoms concerning for COVID-19 infection (fever, chills, cough, or new shortness of breath).    Past Medical History:  Diagnosis Date   Aortic aneurysm (HCC)    DR Cyndia Bent    JUST WATCHING    Arthritis    spine   Bowel obstruction (HCC)    GERD (gastroesophageal reflux disease)    Headache    hx of 2 migraines as a college roommates   High cholesterol    Hypertension    Melanoma ([place])    also basal cell  carcinomas   Meniere's disease of left ear    Radiation 07/03/2015-08/16/2015   Left Mid Chest, surgical clips 66 gray   Scoliosis deformity of spine    Shingles    04/2014     RIGHT EYE, SCALP   Shingles    right eye, forehead and head   Shortness of breath dyspnea    WITH EXERTION    Sleep related leg cramps    Syncopal episodes    x 1 in October, 2015.   Past Surgical History:  Procedure Laterality Date   APPENDECTOMY      CATARACT EXTRACTION W/ INTRAOCULAR LENS  IMPLANT, BILATERAL     COLON SURGERY     6 YRS AGO  "SNIPPED BAND BINDING INTESTINE"   COLONOSCOPY     HERNIA REPAIR     MELANOMA EXCISION     pilonadal cyst removed     surgery for bowel obstruction     THORACOTOMY/LOBECTOMY Left 05/21/2015   Procedure: LEFT THORACOTOMY/LEFT UPPER LOBECTOMY;  Surgeon: Gaye Pollack, MD;  Location: [facility];  Service: Thoracic;  Laterality: Left;   TONSILLECTOMY     VASECTOMY     VIDEO BRONCHOSCOPY WITH ENDOBRONCHIAL NAVIGATION N/A 10/18/2014   Procedure: VIDEO BRONCHOSCOPY WITH ENDOBRONCHIAL NAVIGATION;  Surgeon: Collene Gobble, MD;  Location: [facility];  Service: Thoracic;  Laterality: N/A;     Current Meds  Medication Sig   Calcium Carb-Cholecalciferol (CALCIUM 600 + D PO) Take 600 mg by mouth daily.   carvedilol (COREG) 6.25 MG tablet Take 1 tablet (6.25 mg total) by mouth 2 (two) times daily.   ibuprofen (ADVIL,MOTRIN) 200 MG tablet Take 200 mg by mouth every 6 (six) hours as needed for mild pain or moderate pain.   Multiple Vitamin (MULTIVITAMIN WITH MINERALS) TABS tablet Take 2 tablets by mouth daily.   potassium chloride SA (K-DUR,KLOR-CON) 20 MEQ tablet 20 mEq 2 (two) times daily.    rosuvastatin (CRESTOR) 10 MG tablet Take 5 mg by mouth daily. In AM   telmisartan-hydrochlorothiazide (MICARDIS HCT) 40-12.5 MG tablet Take 1 tablet by mouth daily.   Current Facility-Administered Medications for the 03/04/19 encounter (Telemedicine) with Shantele Reller, Dani Gobble, MD  Medication   0.9 %  sodium chloride infusion     Allergies:   Patient has no known allergies.   Social History   Tobacco Use   Smoking status: Former Smoker    Packs/day: 2.00    Years: 25.00    Pack years: 50.00    Types: Cigarettes    Quit date: 10/21/1981    Years since quitting: 37.3   Smokeless tobacco: Never Used  Substance Use Topics   Alcohol use: Yes    Alcohol/week: 20.0 standard drinks    Types: 20 Standard drinks  or equivalent per week    Comment: occ   Drug use: No     Family Hx: The patient's family history includes Breast cancer in his mother; Prostate cancer in his father.  ROS:   Please see the history of present illness.     All other systems reviewed and are negative.   Prior CV studies:   The following studies were reviewed today:  Echocardiogram 2015 (normal study), event monitor June 2019 (normal results), CT of the chest (4 cm ascending thoracic aortic aneurysm, most recent evaluation June 2019).  Labs/Other Tests and Data Reviewed:    EKG:  An ECG dated Nov 12, 2017 was personally reviewed today and demonstrated:  Sinus bradycardia, nonspecific ST segment scooping most visible  in the inferior leads  Recent Labs: No results found for requested labs within last 8760 hours.   Recent Lipid Panel No results found for: CHOL, TRIG, HDL, CHOLHDL, LDLCALC, LDLDIRECT  Wt Readings from Last 3 Encounters:  03/04/19 180 lb (81.6 kg)  02/10/19 187 lb 6 oz (85 kg)  11/24/18 177 lb (80.3 kg)     Objective:    Vital Signs:  BP 127/82    Pulse 70    Ht 5\' 8"  (1.727 m)    Wt 180 lb (81.6 kg)    BMI 27.37 kg/m    VITAL SIGNS:  reviewed Unable to examine  ASSESSMENT & PLAN:    1. Syncope: The circumstances and the prodromal symptoms of most of his events suggest neurally mediated syncope.   Recommend stopping the hydrochlorothiazide and his antihypertensive regimen which could promote neurally mediated events. A couple of the events have not had significant prodrome which has raised the concern for arrhythmia.  It would also be unusual for neurally mediated syncope to start in a gentleman in his 56s (although he may just have a major component of orthostatic hypotension).  Bradycardia due to conduction system disease should be considered.  I once again encouraged him to consider an implantable loop recorder, which I offered at his previous appointment.  I went over the device, the  implantation procedure, follow-up modalities, possible complications at length today over the phone.  He may have underlying conduction system disease and may benefit from pacemaker implantation (although his previous event monitor was unremarkable). 2. Coronary atherosclerosis/aortic atherosclerosis: He does not have angina pectoris and has normal left ventricular systolic function.  Under the circumstances, the treatment primarily consists of risk factor modification.   3. HLP: His most recent LDL cholesterol was excellent at 58 and he also has a very good HDL cholesterol. 4. HTN: Asked him to send me some blood pressure recordings in 2 or 3 weeks, after the full impact of hydrochlorothiazide has resolved. 5. History of lung adenocarcinoma: Status post surgery and XRT, no evidence of recurrence, has some chronic radiation changes in the left lower lobe.    COVID-19 Education: The signs and symptoms of COVID-19 were discussed with the patient and how to seek care for testing (follow up with PCP or arrange E-visit).  The importance of social distancing was discussed today.  Time:   Today, I have spent 20 minutes with the patient with telehealth technology discussing the above problems.     Medication Adjustments/Labs and Tests Ordered: Current medicines are reviewed at length with the patient today.  Concerns regarding medicines are outlined above.   Tests Ordered: No orders of the defined types were placed in this encounter.   Medication Changes: No orders of the defined types were placed in this encounter.  Patient Instructions  Medication Instructions:  STOP the Telmisartan-hydrochlorothiazide START Telmisartan 40 mg once daily  If you need a refill on your cardiac medications before your next appointment, please call your pharmacy.   Lab work: None ordered If you have labs (blood work) drawn today and your tests are completely normal, you will receive your results only  by:  [place] (if you have MyChart) OR  A paper copy in the mail If you have any lab test that is abnormal or we need to change your treatment, we will call you to review the results.  Testing/Procedures: None ordered  Follow-Up: At [facility], you and your health needs are our priority.  As part  of our continuing mission to provide you with exceptional heart care, we have created designated Provider Care Teams.  These Care Teams include your primary Cardiologist (physician) and Advanced Practice Providers (APPs -  Physician Assistants and Nurse Practitioners) who all work together to provide you with the care you need, when you need it. You will need a follow up appointment in 6 months.  Please call our office 2 months in advance to schedule this appointment.  You may see Sanda Klein, MD or one of the following Advanced Practice Providers on your designated Care Team: Almyra Deforest, [place]  Fabian Sharp, [place]  Any Other Special Instructions Will Be Listed Below (If Applicable).  *You may look at the website [employer] for more information   *Instructions on how to sign up for MyChart will be on this printout   *Please send a record of your blood pressures to MyChart in 3 weeks.      Follow Up:  In Person 6 months  Signed, Sanda Klein, MD  03/04/2019 9:53 AM     [facility]

## 2019-03-04 NOTE — Patient Instructions (Addendum)
Medication Instructions:  STOP the Telmisartan-hydrochlorothiazide START Telmisartan 40 mg once daily  If you need a refill on your cardiac medications before your next appointment, please call your pharmacy.   Lab work: None ordered If you have labs (blood work) drawn today and your tests are completely normal, you will receive your results only by: Marland Kitchen MyChart Message (if you have MyChart) OR . A paper copy in the mail If you have any lab test that is abnormal or we need to change your treatment, we will call you to review the results.  Testing/Procedures: None ordered  Follow-Up: At [facility], you and your health needs are our priority.  As part of our continuing mission to provide you with exceptional heart care, we have created designated Provider Care Teams.  These Care Teams include your primary Cardiologist (physician) and Advanced Practice Providers (APPs -  Physician Assistants and Nurse Practitioners) who all work together to provide you with the care you need, when you need it. You will need a follow up appointment in 6 months.  Please call our office 2 months in advance to schedule this appointment.  You may see Sanda Klein, MD or one of the following Advanced Practice Providers on your designated Care Team: [place], [place] . Fabian Sharp, PA-C  Any Other Special Instructions Will Be Listed Below (If Applicable).  *You may look at the website [employer] for more information   *Instructions on how to sign up for MyChart will be on this printout   *Please send a record of your blood pressures to MyChart in 3 weeks.

## 2019-03-16 DIAGNOSIS — H35371 Puckering of macula, right eye: Secondary | ICD-10-CM | POA: Diagnosis not present

## 2019-03-16 DIAGNOSIS — Z961 Presence of intraocular lens: Secondary | ICD-10-CM | POA: Diagnosis not present

## 2019-03-16 DIAGNOSIS — H52203 Unspecified astigmatism, bilateral: Secondary | ICD-10-CM | POA: Diagnosis not present

## 2019-03-24 ENCOUNTER — Telehealth: Payer: Self-pay | Admitting: Cardiovascular Disease

## 2019-03-24 MED ORDER — TELMISARTAN 80 MG PO TABS: 80.0000 mg | ORAL_TABLET | Freq: Every day | ORAL | 3 refills | Status: DC

## 2019-03-24 NOTE — Telephone Encounter (Signed)
That BP is unfortunately too high now that the diuretic has worn off. Please increase telmisartan to 80 mg daily and send in a new Rx (obviously he can take 2x40 mg daily 'til he runs out of that dose).

## 2019-03-24 NOTE — Telephone Encounter (Signed)
Pt report on 03/04/19 Dr. Loletha Grayer discontinued his Telmisartan-HCTZ to just Telmisartan 40 mg daily.  Pt report he started the medication last Thurdays and since BP has increased but denies any symptoms. BP listed below.  166/91 149/92 157/90 137/87 154/93  9/30- 195/110 (lunch time), 179/91, 155/95 (after taking Temisartan-HCTZ) 10/1-174/105  Will route to MD for recommendations.

## 2019-03-24 NOTE — Telephone Encounter (Signed)
° ° °  Pt c/o BP issue: STAT if pt c/o blurred vision, one-sided weakness or slurred speech  1. What are your last 5 BP readings? 174/105, 195/110  2. Are you having any other symptoms (ex. Dizziness, headache, blurred vision, passed out)? no  3. What is your BP issue? Patient calling to report

## 2019-03-24 NOTE — Telephone Encounter (Signed)
Pt updated and voiced understanding. New orders placed.

## 2019-04-05 ENCOUNTER — Telehealth: Payer: Self-pay | Admitting: Cardiovascular Disease

## 2019-04-05 NOTE — Telephone Encounter (Signed)
Patient is calling with BP readings after change in medications:  10/5 166/107   161/101   143/93  10/6 159/99  10/7 149/93   125/82  10/8 180/101   151/91  10/9 162/91   127/78   129/86  10/10 126/82  10/11 167/91  10/13 142/90  Patient would like to speak to nurse.

## 2019-04-07 NOTE — Telephone Encounter (Signed)
Called him. Will stay on current meds.

## 2019-04-15 ENCOUNTER — Telehealth: Payer: Self-pay | Admitting: Cardiovascular Disease

## 2019-04-15 MED ORDER — HYDROCHLOROTHIAZIDE 12.5 MG PO CAPS: 12.5000 mg | ORAL_CAPSULE | Freq: Every day | ORAL | 3 refills | Status: DC

## 2019-04-15 NOTE — Telephone Encounter (Signed)
Spoke with pt, aware of dr Sallyanne Kuster, New script sent to the pharmacy.

## 2019-04-15 NOTE — Telephone Encounter (Signed)
Spoke with pt, he is calling with current bp readings,  10/16-170/96 10/17-158/94 10/19-170/102-148/94-139/90 10/20-171/102-155/96 10/21-172/112-142/90-167/113 10/22-176/101-187/99-157/91  He reports taking the micardis 80 mg for about 2 weeks now. Will forward to dr croitoru to review and advise.

## 2019-04-15 NOTE — Telephone Encounter (Signed)
New message:    Patient calling stating that he has spoke with Dr. Loletha Grayer about his BP medications. Please call patient he has some question.

## 2019-04-15 NOTE — Telephone Encounter (Signed)
Unfortunately those blood pressure readings are too high, even allowing for a little permissive hypertension to avoid syncope.  It looks like we will get away without a little bit of diuretic.   Please give him a prescription for hydrochlorothiazide 12.5 mg to take only 3 days a week, for example Monday-Wednesday-Friday.   Send back some blood pressure readings in a couple of weeks, just like he did today, please.  Will take a couple of weeks for the full effect.  Would like to get his systolic blood pressure consistently less than 164, diastolic blood pressure consistently around 90 or less.

## 2019-04-18 ENCOUNTER — Encounter: Payer: Self-pay | Admitting: Cardiovascular Disease

## 2019-04-18 MED ORDER — HYDROCHLOROTHIAZIDE 12.5 MG PO CAPS: 12.5000 mg | ORAL_CAPSULE | ORAL | 6 refills | Status: DC

## 2019-04-18 NOTE — Telephone Encounter (Signed)
Returned call to pt apologized for the incorrect sig/quantity sent to the pharmacy. I have corrected this and sent new rx to the pharmacy. Also, pt states that he is fine with this. Notified pt to take and log BP BID and give [place] a call in a few weeks for  updated BP log.

## 2019-04-18 NOTE — Addendum Note (Signed)
Addended by: Waylan Rocher on: 04/18/2019 04:40 PM   Modules accepted: Orders

## 2019-04-18 NOTE — Telephone Encounter (Signed)
error 

## 2019-04-18 NOTE — Telephone Encounter (Signed)
  Patient called stating that when he went to pick up his prescription of hydrochlorothiazide (MICROZIDE) 12.5 MG capsule that the script stated to take 1 pill daily. He called to verify that he is only supposed to take this 3 days a week and would like the pharmacy to be made aware.

## 2019-04-19 DIAGNOSIS — Z125 Encounter for screening for malignant neoplasm of prostate: Secondary | ICD-10-CM | POA: Diagnosis not present

## 2019-04-19 DIAGNOSIS — E7849 Other hyperlipidemia: Secondary | ICD-10-CM | POA: Diagnosis not present

## 2019-04-25 DIAGNOSIS — R82998 Other abnormal findings in urine: Secondary | ICD-10-CM | POA: Diagnosis not present

## 2019-04-25 DIAGNOSIS — I10 Essential (primary) hypertension: Secondary | ICD-10-CM | POA: Diagnosis not present

## 2019-04-26 DIAGNOSIS — Z1331 Encounter for screening for depression: Secondary | ICD-10-CM | POA: Diagnosis not present

## 2019-04-26 DIAGNOSIS — M545 Low back pain: Secondary | ICD-10-CM | POA: Diagnosis not present

## 2019-04-26 DIAGNOSIS — I10 Essential (primary) hypertension: Secondary | ICD-10-CM | POA: Diagnosis not present

## 2019-04-26 DIAGNOSIS — R0609 Other forms of dyspnea: Secondary | ICD-10-CM | POA: Diagnosis not present

## 2019-04-26 DIAGNOSIS — R7401 Elevation of levels of liver transaminase levels: Secondary | ICD-10-CM | POA: Diagnosis not present

## 2019-04-26 DIAGNOSIS — Z1339 Encounter for screening examination for other mental health and behavioral disorders: Secondary | ICD-10-CM | POA: Diagnosis not present

## 2019-04-26 DIAGNOSIS — Z1212 Encounter for screening for malignant neoplasm of rectum: Secondary | ICD-10-CM | POA: Diagnosis not present

## 2019-04-26 DIAGNOSIS — D7589 Other specified diseases of blood and blood-forming organs: Secondary | ICD-10-CM | POA: Diagnosis not present

## 2019-04-26 DIAGNOSIS — I712 Thoracic aortic aneurysm, without rupture: Secondary | ICD-10-CM | POA: Diagnosis not present

## 2019-04-26 DIAGNOSIS — R55 Syncope and collapse: Secondary | ICD-10-CM | POA: Diagnosis not present

## 2019-04-26 DIAGNOSIS — E785 Hyperlipidemia, unspecified: Secondary | ICD-10-CM | POA: Diagnosis not present

## 2019-04-26 DIAGNOSIS — I2510 Atherosclerotic heart disease of native coronary artery without angina pectoris: Secondary | ICD-10-CM | POA: Diagnosis not present

## 2019-04-26 DIAGNOSIS — Z0000 Encounter for general adult medical examination without abnormal findings: Secondary | ICD-10-CM | POA: Diagnosis not present

## 2019-04-28 ENCOUNTER — Other Ambulatory Visit: Payer: Self-pay | Admitting: Cardiovascular Disease

## 2019-05-03 ENCOUNTER — Telehealth: Payer: Self-pay | Admitting: Cardiovascular Disease

## 2019-05-03 ENCOUNTER — Other Ambulatory Visit (INDEPENDENT_AMBULATORY_CARE_PROVIDER_SITE_OTHER): Payer: PPO

## 2019-05-03 ENCOUNTER — Ambulatory Visit: Payer: PPO | Admitting: Internal Medicine

## 2019-05-03 ENCOUNTER — Encounter: Payer: Self-pay | Admitting: Internal Medicine

## 2019-05-03 ENCOUNTER — Other Ambulatory Visit: Payer: Self-pay

## 2019-05-03 DIAGNOSIS — R0609 Other forms of dyspnea: Secondary | ICD-10-CM

## 2019-05-03 DIAGNOSIS — R0600 Dyspnea, unspecified: Secondary | ICD-10-CM

## 2019-05-03 DIAGNOSIS — I10 Essential (primary) hypertension: Secondary | ICD-10-CM | POA: Diagnosis not present

## 2019-05-03 LAB — CBC WITH DIFFERENTIAL/PLATELET
Basophils Absolute: 0 10*3/uL (ref 0.0–0.1)
Basophils Relative: 0.7 % (ref 0.0–3.0)
Eosinophils Absolute: 0.1 10*3/uL (ref 0.0–0.7)
Eosinophils Relative: 2.1 % (ref 0.0–5.0)
HCT: 40.7 % (ref 39.0–52.0)
Hemoglobin: 14 g/dL (ref 13.0–17.0)
Lymphocytes Relative: 12.2 % (ref 12.0–46.0)
Lymphs Abs: 0.7 10*3/uL (ref 0.7–4.0)
MCHC: 34.3 g/dL (ref 30.0–36.0)
MCV: 106.4 fl — ABNORMAL HIGH (ref 78.0–100.0)
Monocytes Absolute: 0.8 10*3/uL (ref 0.1–1.0)
Monocytes Relative: 14.6 % — ABNORMAL HIGH (ref 3.0–12.0)
Neutro Abs: 4.1 10*3/uL (ref 1.4–7.7)
Neutrophils Relative %: 70.4 % (ref 43.0–77.0)
Platelets: 194 10*3/uL (ref 150.0–400.0)
RBC: 3.82 Mil/uL — ABNORMAL LOW (ref 4.22–5.81)
RDW: 12.7 % (ref 11.5–15.5)
WBC: 5.8 10*3/uL (ref 4.0–10.5)

## 2019-05-03 LAB — BASIC METABOLIC PANEL
BUN: 16 mg/dL (ref 6–23)
CO2: 30 mEq/L (ref 19–32)
Calcium: 9.9 mg/dL (ref 8.4–10.5)
Chloride: 92 mEq/L — ABNORMAL LOW (ref 96–112)
Creatinine, Ser: 0.83 mg/dL (ref 0.40–1.50)
GFR: 89.57 mL/min (ref >60.00)
Glucose, Bld: 104 mg/dL — ABNORMAL HIGH (ref 70–99)
Potassium: 4.2 mEq/L (ref 3.5–5.1)
Sodium: 130 mEq/L — ABNORMAL LOW (ref 135–145)

## 2019-05-03 LAB — BRAIN NATRIURETIC PEPTIDE: Pro B Natriuretic peptide (BNP): 66 pg/mL (ref 0.0–100.0)

## 2019-05-03 LAB — TSH: TSH: 1.61 u[IU]/mL (ref 0.35–4.50)

## 2019-05-03 NOTE — Patient Instructions (Addendum)
Pantoprazole (protonix) 40 mg   Take  30-60 min before first meal of the day and Pepcid (famotidine)  20 mg one @  bedtime until return to office - this is the best way to tell whether stomach acid is contributing to your problem.    GERD (REFLUX)  is an extremely common cause of respiratory symptoms just like yours , many times with no obvious heartburn at all.    It can be treated with medication, but also with lifestyle changes including elevation of the head of your bed (ideally with 6 -8inch blocks under the headboard of your bed),  Smoking cessation, avoidance of late meals, excessive alcohol, and avoid fatty foods, chocolate, peppermint, colas, red wine, and acidic juices such as orange juice.  NO MINT OR MENTHOL PRODUCTS SO NO COUGH DROPS  USE SUGARLESS CANDY INSTEAD (Jolley ranchers or Stover's or Life Savers) or even ice chips will also do - the key is to swallow to prevent all throat clearing. NO OIL BASED VITAMINS - use powdered substitutes.  Avoid fish oil when coughing.    Please remember to go to the lab department at [facility] (across from [place])   for your tests - we will call you with the results when they are available.  Pace yourself to lower pace with goal of keeping your above 90% (pulse oximeter)       Please schedule a follow up office visit in 4 weeks, sooner if needed  Add:    Rec change coreg to bisoprolol 5 mg daily and venous dopplers to be complete

## 2019-05-03 NOTE — Telephone Encounter (Signed)
Patient notified and verbalized understanding. 

## 2019-05-03 NOTE — Progress Notes (Signed)
George Montoya., male    DOB: 07-15-1940,     MRN: [id number]   Brief patient profile:  22 yowm quit smoking 1983 s/p LULobectomy 04/2015 [facility])  and good residual ex tol    in [place] but at 4500 feet when toting wood less activity tolerance esp since March 2020 when retired to [employer] where lives most of the time now.  Self referred to pulmonary clinic 05/03/2019 for doe   Bartles note 11/24/2018  s/p left upper lobectomy for a pT1b, pN0 stage IA well differentiated adenocarcinoma on 05/21/2015. Hedid not have afissure between the upper and lower lobes and the stapled resection margin was positive. He was not a candidate for a completion pneumonectomy and received XRT to 66 gray in 63 fractionsby Dr. Alcide Goodness on 08/16/2015.     History of Present Illness  05/03/2019  Pulmonary/ 1st office eval/Yaniah Thiemann  Chief Complaint  Patient presents with  . Pulmonary Consult    Self referral. Pt c/o DOE for the past several years.   Dyspnea:  None in [place] but not "toting wood" up inclined, no assoc cp/ diaphoresis Cough: onset every since surgery some worse over the last year occ white mucus sporadic, not in am, not seasonal or assoc with wheeze or obvious nasal symptoms Sleep: fine here and [place] SABA use: none  02 none   No obvious day to day or daytime variability or assoc  purulent sputum or mucus plugs or hemoptysis or cp or chest tightness, subjective wheeze or overt sinus or hb symptoms.   Sleeping fine flat  without nocturnal  or early am exacerbation  of respiratory  c/o's or need for noct saba. Also denies any obvious fluctuation of symptoms with weather or environmental changes or other aggravating or alleviating factors except as outlined above   No unusual exposure hx or h/o childhood pna/ asthma or knowledge of premature birth.  Current Allergies, Complete Past Medical History, Past Surgical History, Family History, and Social History were reviewed in  [employer] record.  ROS  The following are not active complaints unless bolded Hoarseness, sore throat, dysphagia, dental problems, itching, sneezing,  nasal congestion or discharge of excess mucus or purulent secretions, ear ache,   fever, chills, sweats, unintended wt loss or wt gain, classically pleuritic or exertional cp,  orthopnea pnd or arm/hand swelling  or leg swelling, presyncope, palpitations, abdominal pain, anorexia, nausea, vomiting, diarrhea  or change in bowel habits or change in bladder habits, change in stools or change in urine, dysuria, hematuria,  rash, arthralgias, visual complaints, headache, numbness, weakness or ataxia or problems with walking or coordination,  change in mood or  memory.           Past Medical History:  Diagnosis Date  . Aortic aneurysm (HCC)    DR BARTLE    JUST WATCHING   . Arthritis    spine  . Bowel obstruction ([place])   . GERD (gastroesophageal reflux disease)   . Headache    hx of 2 migraines as a college roommates  . High cholesterol   . Hypertension   . Melanoma ([place])    also basal cell carcinomas  . Meniere's disease of left ear   . Radiation 07/03/2015-08/16/2015   Left Mid Chest, surgical clips 66 gray  . Scoliosis deformity of spine   . Shingles    04/2014     RIGHT EYE, SCALP  . Shingles    right eye, forehead and head  .  Shortness of breath dyspnea    WITH EXERTION   . Sleep related leg cramps   . Syncopal episodes    x 1 in October, 2015.    Outpatient Medications Prior to Visit  Medication Sig Dispense Refill  . Calcium Carb-Cholecalciferol (CALCIUM 600 + D PO) Take 600 mg by mouth daily.    . carvedilol (COREG) 6.25 MG tablet TAKE 1 TABLET BY MOUTH TWICE DAILY. 180 tablet 0  . hydrochlorothiazide (MICROZIDE) 12.5 MG capsule Take 1 capsule (12.5 mg total) by mouth 3 (three) times a week. 12 capsule 6  . Multiple Vitamin (MULTIVITAMIN WITH MINERALS) TABS tablet Take 2 tablets by mouth daily.    .  potassium chloride SA (K-DUR,KLOR-CON) 20 MEQ tablet 20 mEq 2 (two) times daily.     . rosuvastatin (CRESTOR) 10 MG tablet Take 5 mg by mouth daily. In AM    . telmisartan (MICARDIS) 80 MG tablet Take 1 tablet (80 mg total) by mouth daily. 90 tablet 3  . ibuprofen (ADVIL,MOTRIN) 200 MG tablet Take 200 mg by mouth every 6 (six) hours as needed for mild pain or moderate pain.        Objective:     BP (!) 170/86 (BP Location: Left Arm, Cuff Size: Normal)   Pulse 64   Temp 97.8 F (36.6 C) (Temporal)   Ht 5' 7.5" (1.715 m)   Wt 188 lb (85.3 kg)   SpO2 99% Comment: on RA  BMI 29.01 kg/m   SpO2: 99 %(on RA)   amb robust wm nad   HEENT : pt wearing mask not removed for exam due to covid -19 concerns.    NECK :  without JVD/Nodes/TM/ nl carotid upstrokes bilaterally   LUNGS: no acc muscle use,  Nl contour chest with some upper airway rhonchi   L > R  without cough on insp or exp maneuvers    CV:  RRR  no s3 or murmur or increase in P2, and trace ankle edema bilaterally  ABD:  soft and nontender with nl inspiratory excursion in the supine position. No bruits or organomegaly appreciated, bowel sounds nl  MS:  Nl gait/ ext warm without deformities, calf tenderness, cyanosis or clubbing No obvious joint restrictions   SKIN: warm and dry without lesions    NEURO:  alert, approp, nl sensorium with  no motor or cerebellar deficits apparent.         I personally reviewed images and agree with radiology impression as follows:   Chest CT 11/24/2018  1. Stable chest with chronic post treatment changes in the left upper lobe. No findings to suggest local tumor recurrence or metastatic disease. 2. Hepatic steatosis 3. Aortic Atherosclerosis (ICD10-I70.0). Coronary artery Calcifications.   Labs ordered/ reviewed:      Chemistry      Component Value Date/Time   NA 130 (L) 05/03/2019 1542   K 4.2 05/03/2019 1542   CL 92 (L) 05/03/2019 1542   CO2 30 05/03/2019 1542   BUN 16  05/03/2019 1542   CREATININE 0.83 05/03/2019 1542   CREATININE 1.1 10/04/2015 1438      Component Value Date/Time   CALCIUM 9.9 05/03/2019 1542   ALKPHOS 54 05/16/2015 1122   AST 35 05/16/2015 1122   ALT 32 05/16/2015 1122   BILITOT 0.6 05/16/2015 1122        Lab Results  Component Value Date   WBC 5.8 05/03/2019   HGB 14.0 05/03/2019   HCT 40.7 05/03/2019   MCV 106.4 (  H) 05/03/2019   PLT 194.0 05/03/2019       EOS                                                               0.1                                    05/03/2019    Lab Results  Component Value Date   DDIMER 1.10 (H) 05/03/2019      Lab Results  Component Value Date   TSH 1.61 05/03/2019     Lab Results  Component Value Date   PROBNP 66.0 05/03/2019                Assessment   No problem-specific Assessment & Plan notes found for this encounter.     Christinia Gully, MD 05/03/2019

## 2019-05-03 NOTE — Telephone Encounter (Signed)
Fwd to provider for review.

## 2019-05-03 NOTE — Telephone Encounter (Signed)
Seems to be heading in the right direction. One BP as low as 114/81. I would stick with this medical regimen for now.

## 2019-05-03 NOTE — Telephone Encounter (Signed)
  Pt c/o BP issue: STAT if pt c/o blurred vision, one-sided weakness or slurred speech  1. What are your last 5 BP readings?   10/26  173/94 10/27  143/88   156/99 10/28  138/82   146/87 10/29  171/98   161/101 10/30  171/10   174/102 10/31  141/94 11/01  164/92   154/92 11/02  171/101 133/89 11/03  120/82 11/04  154/98   143/94 11/05  162/105 154/89 11/06  136/90   168/101 11/07  128/86   173/110 11/08  136/87   114/81 11/09  169/109 173/99 11/10  149/91  2. Are you having any other symptoms (ex. Dizziness, headache, blurred vision, passed out)? NA  3. What is your BP issue? Patient was asked to call with two weeks worth of BP readings

## 2019-05-04 ENCOUNTER — Telehealth: Payer: Self-pay | Admitting: Internal Medicine

## 2019-05-04 ENCOUNTER — Telehealth (HOSPITAL_COMMUNITY): Payer: Self-pay | Admitting: *Deleted

## 2019-05-04 ENCOUNTER — Encounter: Payer: Self-pay | Admitting: Internal Medicine

## 2019-05-04 DIAGNOSIS — R7989 Other specified abnormal findings of blood chemistry: Secondary | ICD-10-CM

## 2019-05-04 LAB — D-DIMER, QUANTITATIVE: D-Dimer, Quant: 1.1 mcg/mL FEU — ABNORMAL HIGH (ref <0.50)

## 2019-05-04 MED ORDER — FAMOTIDINE 20 MG PO TABS: 20.0000 mg | ORAL_TABLET | Freq: Every day | ORAL | 11 refills | Status: AC

## 2019-05-04 MED ORDER — PANTOPRAZOLE SODIUM 40 MG PO TBEC: DELAYED_RELEASE_TABLET | ORAL | 11 refills | Status: DC

## 2019-05-04 NOTE — Telephone Encounter (Signed)
Instructions  Pantoprazole (protonix) 40 mg   Take  30-60 min before first meal of the day and Pepcid (famotidine)  20 mg one @  bedtime until return to office - this is the best way to tell whether stomach acid is contributing to your problem.    GERD (REFLUX)  is an extremely common cause of respiratory symptoms just like yours , many times with no obvious heartburn at all.    It can be treated with medication, but also with lifestyle changes including elevation of the head of your bed (ideally with 6 -8inch blocks under the headboard of your bed),  Smoking cessation, avoidance of late meals, excessive alcohol, and avoid fatty foods, chocolate, peppermint, colas, red wine, and acidic juices such as orange juice.  NO MINT OR MENTHOL PRODUCTS SO NO COUGH DROPS  USE SUGARLESS CANDY INSTEAD (Jolley ranchers or Stover's or Life Savers) or even ice chips will also do - the key is to swallow to prevent all throat clearing. NO OIL BASED VITAMINS - use powdered substitutes.  Avoid fish oil when coughing.    Please remember to go to the lab department at [facility] (across from [place])   for your tests - we will call you with the results when they are available.  Pace yourself to lower pace with goal of keeping your above 90% (pulse oximeter)       Please schedule a follow up office visit in 4 weeks, sooner if needed  Add:    Rec change coreg to bisoprolol 5 mg daily and venous dopplers to be complete     Both pantoprazole and pepcid Rx have been sent to pt's preferred pharmacy. Called and spoke with pt letting him know this had been done and pt verbalized understanding.nothing further needed.

## 2019-05-04 NOTE — Assessment & Plan Note (Signed)
05/03/2019  Rec change coreg to bisoprolol 5 mg daily   In the setting of respiratory symptoms of unknown etiology,  It would be preferable to use bystolic, the most beta -1  selective Beta blocker available in sample form, with bisoprolol the most selective generic choice  on the market, at least on a trial basis, to make sure the spillover Beta 2 effects of the less specific Beta blockers are not contributing to this patient's symptoms.   bp not well controlled so needs to titrate up coreg if pulse rate allows so rec change to bisoprolol but monitor pulse and bp every few days (or let cards rx )   Total time devoted to counseling  > 50 % of initial 60 min office visit:  reviewde case with pt/  directly observed portions of ambulatory 02 saturation study/ discussion of options/alternatives/ personally creating written customized instructions  in presence of pt  then going over those specific  Instructions directly with the pt including how to use all of the meds but in particular covering each new medication in detail and the difference between the maintenance= "automatic" meds and the prns using an action plan format for the latter (If this problem/symptom => do that organization reading Left to right).  Please see AVS from this visit for a full list of these instructions which I personally wrote for this pt and  are unique to this visit.

## 2019-05-04 NOTE — Assessment & Plan Note (Addendum)
Quit smoking 1983 - nl lung function 10/20/14 - s/p LULobectomy 04/2015 with RT s impact on ex tol - ? Worse since March 2020 when moved to 4500 ft - 05/03/2019   Walked RA  2 laps @  approx 250ft each @ fast pace  stopped due to  End of study, mild sob, sats still 96%     Symptoms at 4500 ft  disproportionate to objective findings at 350 ft above sea level  and not clear to what extent this is actually a pulmonary  problem but pt does appear to have difficult to sort out respiratory symptoms of unknown origin for which  DDX  = almost all start with A and  include Adherence, Ace Inhibitors, Acid Reflux, Active Sinus Disease, Alpha 1 Antitripsin deficiency, Anxiety masquerading as Airways dz,  ABPA,  Allergy(esp in young), Aspiration (esp in elderly), Adverse effects of meds,  Active smoking or Vaping, A bunch of PE's/clot burden (a few small clots can't cause this syndrome unless there is already severe underlying pulm or vascular dz with poor reserve),  Anemia or thyroid disorder, plus two Bs  = Bronchiectasis and Beta blocker use..and one C= CHF   Adherence is always the initial "prime suspect" and is a multilayered concern that requires a "trust but verify" approach in every patient - starting with knowing how to use medications, especially inhalers, correctly, keeping up with refills and understanding the fundamental difference between maintenance and prns vs those medications only taken for a very short course and then stopped and not refilled.   ? Acid (or non-acid) GERD > always difficult to exclude as up to 75% of pts in some series report no assoc GI/ Heartburn symptoms> rec max (24h)  acid suppression and diet restrictions/ reviewed and instructions given in writing.   ? Allergy/ asthma unlikely s noct symptoms though he does have an intermittent cough more likely this is gerd related  - needs pfts before and after to complete the w/u.  ? Anemia, thyroid dz rulled out today   ? A bunch of  PEs >  D dimer upper limit of nl in this setting is 1.0 so 1.1. is really not suggestive of a significant clot burden.  Since has some chronic leg swelling would just rec venous dopplers for now.   ? Beta blocker effects > unlikely on coreg but note bp not well controlled and needs to titrate up (see separate a/p)   ? CHF > excluded with such a low bnp today

## 2019-05-04 NOTE — Progress Notes (Signed)
LMTCB

## 2019-05-04 NOTE — Telephone Encounter (Signed)
Call returned to patient, confirmed DOB, made aware of results per MW:  Notes recorded by Tanda Rockers, MD on 05/04/2019 at 6:20 AM EST  Call patient : Study is non- diagnostic - not high enough to be concerned with pe but I would do venous dopplers this week both legs to be sure they are nl and not early source for occult PE.   Voiced understanding. Placed order for bilateral venous doppler per MW.   Nothing further needed at this time.

## 2019-05-04 NOTE — Telephone Encounter (Signed)
The above patient or their representative was contacted and gave the following answers to these questions:         Do you have any of the following symptoms?    NO  Fever                    Cough                   Shortness of breath  Do  you have any of the following other symptoms?    muscle pain         vomiting,        diarrhea        rash         weakness        red eye        abdominal pain         bruising          bruising or bleeding              joint pain           severe headache    Have you been in contact with someone who was or has been sick in the past 2 weeks?  NO  Yes                 Unsure                         Unable to assess   Does the person that you were in contact with have any of the following symptoms?   Cough         shortness of breath           muscle pain         vomiting,            diarrhea            rash            weakness           fever            red eye           abdominal pain           bruising  or  bleeding                joint pain                severe headache                 COMMENTS OR ACTION PLAN FOR THIS PATIENT:    No as of 05/04/19

## 2019-05-04 NOTE — Telephone Encounter (Signed)

## 2019-05-05 ENCOUNTER — Telehealth: Payer: Self-pay | Admitting: Internal Medicine

## 2019-05-05 ENCOUNTER — Ambulatory Visit (HOSPITAL_COMMUNITY)
Admission: RE | Admit: 2019-05-05 | Discharge: 2019-05-05 | Disposition: A | Discharge status: Home / Self Care | Payer: PPO | Source: Ambulatory Visit | Attending: Family | Admitting: Family

## 2019-05-05 ENCOUNTER — Telehealth: Payer: Self-pay | Admitting: *Deleted

## 2019-05-05 ENCOUNTER — Other Ambulatory Visit: Payer: Self-pay

## 2019-05-05 DIAGNOSIS — R7989 Other specified abnormal findings of blood chemistry: Secondary | ICD-10-CM | POA: Diagnosis not present

## 2019-05-05 NOTE — Telephone Encounter (Signed)
Spoke with the pt  He states that he is in the hands of a cardiologist Dr Sallyanne Kuster and he wants him to manage his blood pressure  He has been checking his BP at home and calling in with his readings and they have been good and Dr C is happy and so he does not want to make any changes  He feels his pressure was up bc he was at the dr's office  Will forward to MW to make him awaree

## 2019-05-05 NOTE — Telephone Encounter (Signed)
Received call report from Manuela Schwartz w/ [facility] Patient's doppler was negative for DVT Patient sent home and advised we will call back with detailed results  Results are not yet in epic at the time of this documentation Will forward to Dr Melvyn Novas

## 2019-05-05 NOTE — Telephone Encounter (Signed)
Aware,no additional suggestions

## 2019-05-05 NOTE — Telephone Encounter (Signed)
-----   Message from Tanda Rockers, MD sent at 05/05/2019  6:32 AM EST ----- Change coreg to bisoprolol ----- Message ----- From: Rosana Berger, CMA Sent: 05/04/2019   5:34 PM EST To: Tanda Rockers, MD  Change what to bisoprolol? Please advise thanks ----- Message ----- From: Tanda Rockers, MD Sent: 05/04/2019   5:39 AM EST To: Rosana Berger, CMA  I didn't realize how high his bp was but given breathing problems rec change to bisoprolol 5 mg daily and monitor bp and pulse every few days p change and call me with problems (alternative is let cards handle the bp but don't want to use higher doses of coreg here as can affect breathing much more so than bisoprolol

## 2019-05-09 NOTE — Progress Notes (Signed)
Spoke with pt and notified of results per Dr. Wert. Pt verbalized understanding and denied any questions. 

## 2019-05-09 NOTE — Telephone Encounter (Signed)
Call made to patient, confirmed DOB, made aware of results per MW:  Notes recorded by Tanda Rockers, MD on 05/05/2019 at 4:39 PM EST  Call patient : Studies are unremarkable, no evidence of blood clots.   Voiced understanding. Nothing further is needed at this time.

## 2019-05-11 ENCOUNTER — Other Ambulatory Visit: Payer: Self-pay | Admitting: Surgery

## 2019-05-11 DIAGNOSIS — Z08 Encounter for follow-up examination after completed treatment for malignant neoplasm: Secondary | ICD-10-CM

## 2019-06-02 ENCOUNTER — Other Ambulatory Visit: Payer: Self-pay

## 2019-06-02 ENCOUNTER — Ambulatory Visit (INDEPENDENT_AMBULATORY_CARE_PROVIDER_SITE_OTHER): Payer: PPO | Admitting: Adult Health

## 2019-06-02 ENCOUNTER — Encounter: Payer: Self-pay | Admitting: Adult Health

## 2019-06-02 VITALS — BP 144/84 | HR 78 | Temp 98.0°F | Ht 67.5 in | Wt 185.6 lb

## 2019-06-02 DIAGNOSIS — Z85828 Personal history of other malignant neoplasm of skin: Secondary | ICD-10-CM | POA: Diagnosis not present

## 2019-06-02 DIAGNOSIS — Z85820 Personal history of malignant melanoma of skin: Secondary | ICD-10-CM | POA: Diagnosis not present

## 2019-06-02 DIAGNOSIS — R0600 Dyspnea, unspecified: Secondary | ICD-10-CM | POA: Diagnosis not present

## 2019-06-02 DIAGNOSIS — D1801 Hemangioma of skin and subcutaneous tissue: Secondary | ICD-10-CM | POA: Diagnosis not present

## 2019-06-02 DIAGNOSIS — D485 Neoplasm of uncertain behavior of skin: Secondary | ICD-10-CM | POA: Diagnosis not present

## 2019-06-02 DIAGNOSIS — R0609 Other forms of dyspnea: Secondary | ICD-10-CM

## 2019-06-02 DIAGNOSIS — Z902 Acquired absence of lung [part of]: Secondary | ICD-10-CM

## 2019-06-02 DIAGNOSIS — L821 Other seborrheic keratosis: Secondary | ICD-10-CM | POA: Diagnosis not present

## 2019-06-02 DIAGNOSIS — L570 Actinic keratosis: Secondary | ICD-10-CM | POA: Diagnosis not present

## 2019-06-02 DIAGNOSIS — C4441 Basal cell carcinoma of skin of scalp and neck: Secondary | ICD-10-CM | POA: Diagnosis not present

## 2019-06-02 DIAGNOSIS — C44319 Basal cell carcinoma of skin of other parts of face: Secondary | ICD-10-CM | POA: Diagnosis not present

## 2019-06-02 DIAGNOSIS — L820 Inflamed seborrheic keratosis: Secondary | ICD-10-CM | POA: Diagnosis not present

## 2019-06-02 NOTE — Assessment & Plan Note (Signed)
Status post lobectomy and radiation therapy for lung cancer 2016.  Keep follow-up CT for next week.

## 2019-06-02 NOTE — Assessment & Plan Note (Signed)
Dyspnea mainly with heavy activity such as inclines and carrying heavy objects.  Patient has no desaturations with ambulation.  D-dimer was minimally elevated which is nonspecific with his age.  Venous Dopplers were negative.  Low suspicion for PE. Suspect he has a component of deconditioning and also has a decrease in his lung function since status post left upper lobectomy.  CT chest in June 2020 showed stable changes no evidence of recurrent disease and stable radiation changes.  Patient has an upcoming CT chest next week.  We will have patient complete PFTs on return.  Plan  Patient Instructions  Activity as tolerated.  Continue on current regimen .  Follow up for CT chest as planned next week.  Follow up with Dr. Melvyn Novas  In 6 weeks with PFT and As needed   Please contact office for sooner follow up if symptoms do not improve or worsen or seek emergency care

## 2019-06-02 NOTE — Patient Instructions (Signed)
Activity as tolerated.  Continue on current regimen .  Follow up for CT chest as planned next week.  Follow up with Dr. Melvyn Novas  In 6 weeks with PFT and As needed   Please contact office for sooner follow up if symptoms do not improve or worsen or seek emergency care

## 2019-06-02 NOTE — Progress Notes (Signed)
@Patient  ID: George Montoya., male    DOB: [birth year], 78 y.o.   MRN: [id number]  No chief complaint on file.   Referring provider: Leanna Battles, MD  HPI: 78 year old male former smoker (1983) with previous history of stage Ia well-differentiated adenocarcinoma status post left upper lobectomy and radiation-diagnosed in 2016 finished radiation February 2017  TEST/EVENTS :  Pulmonary function testing April 2016 showed normal lung function with no airflow obstruction or restriction.  06/02/2019 Follow up : Dyspnea Patient presents for a 1 month follow-up.  Patient was seen last visit to reestablish with pulmonary for shortness of breath.  He was last seen in 2016 after diagnosis of lung cancer status post left upper lobectomy.  Patient has had serial CT follow-up with no evidence of disease recurrence.  He does have a follow-up CT chest next week. He denies any unintentional weight loss or hemoptysis.. Patient says he is fairly active plays golf.  But since the pandemic [place]  He notices that when he is at a high altitude or trying to go up stairs especially if carrying items such as wood he gets short of breath.  He says over the last 9 months he has not been as active because of the pandemic.  He denies any significant cough.  Does have some occasionally wheezing but feels it is coming from his throat.  He has no drainage, chest pain, orthopnea or calf pain. Patient's D-dimer was slightly elevated on lab work.  Venous Dopplers were done and negative.  Patient was started on GERD treatment last visit for possible silent reflux.  Patient says he does not have any noticeable reflux but feels that he has had some chronic diarrhea and this improved greatly since starting this regimen. Patient also was recommended to change Coreg to bisoprolol however states he was unable to do this due to his cardiology recommendations. Patient says overall he feels good.   He has no significant shortness of breath at rest or walking on a flat surface. Says he is following with cardiology.  Recently had medication changes for blood pressure.  2D echo 2016 showed an EF of 55 to 65%.  Considered a low risk study. He denies any exertional chest pain.   No Known Allergies  Immunization History  Administered Date(s) Administered  . Influenza, High Dose Seasonal PF 04/02/2019  . Pneumococcal-Unspecified 06/23/2004  . Zoster Recombinat (Shingrix) 06/01/2017, 06/23/2017    Past Medical History:  Diagnosis Date  . Aortic aneurysm (HCC)    DR BARTLE    JUST WATCHING   . Arthritis    spine  . Bowel obstruction ([place])   . GERD (gastroesophageal reflux disease)   . Headache    hx of 2 migraines as a college roommates  . High cholesterol   . Hypertension   . Melanoma ([place])    also basal cell carcinomas  . Meniere's disease of left ear   . Radiation 07/03/2015-08/16/2015   Left Mid Chest, surgical clips 66 gray  . Scoliosis deformity of spine   . Shingles    04/2014     RIGHT EYE, SCALP  . Shingles    right eye, forehead and head  . Shortness of breath dyspnea    WITH EXERTION   . Sleep related leg cramps   . Syncopal episodes    x 1 in October, 2015.    Tobacco History: Social History   Tobacco Use  Smoking Status Former Smoker  .  Packs/day: 2.00  . Years: 25.00  . Pack years: 50.00  . Types: Cigarettes  . Quit date: 10/21/1981  . Years since quitting: 37.6  Smokeless Tobacco Never Used   Counseling given: Not Answered   Outpatient Medications Prior to Visit  Medication Sig Dispense Refill  . Calcium Carb-Cholecalciferol (CALCIUM 600 + D PO) Take 600 mg by mouth daily.    . carvedilol (COREG) 6.25 MG tablet TAKE 1 TABLET BY MOUTH TWICE DAILY. 180 tablet 0  . famotidine (PEPCID) 20 MG tablet Take 1 tablet (20 mg total) by mouth at bedtime. 30 tablet 11  . hydrochlorothiazide (MICROZIDE) 12.5 MG capsule Take 1 capsule (12.5 mg total) by  mouth 3 (three) times a week. 12 capsule 6  . Multiple Vitamin (MULTIVITAMIN WITH MINERALS) TABS tablet Take 2 tablets by mouth daily.    . pantoprazole (PROTONIX) 40 MG tablet Take 30-82min before first meal of the day 30 tablet 11  . potassium chloride SA (K-DUR,KLOR-CON) 20 MEQ tablet 20 mEq 2 (two) times daily.     . rosuvastatin (CRESTOR) 10 MG tablet Take 5 mg by mouth daily. In AM    . telmisartan (MICARDIS) 80 MG tablet Take 1 tablet (80 mg total) by mouth daily. 90 tablet 3   Facility-Administered Medications Prior to Visit  Medication Dose Route Frequency Provider Last Rate Last Admin  . 0.9 %  sodium chloride infusion  500 mL Intravenous Continuous Armbruster, Carlota Raspberry, MD         Review of Systems:   Constitutional:   No  weight loss, night sweats,  Fevers, chills, fatigue, or  lassitude.  HEENT:   No headaches,  Difficulty swallowing,  Tooth/dental problems, or  Sore throat,                No sneezing, itching, ear ache, nasal congestion, post nasal drip,   CV:  No chest pain,  Orthopnea, PND, swelling in lower extremities, anasarca, dizziness, palpitations, syncope.   GI  No heartburn, indigestion, abdominal pain, nausea, vomiting, diarrhea, change in bowel habits, loss of appetite, bloody stools.   Resp   No chest wall deformity  Skin: no rash or lesions.  GU: no dysuria, change in color of urine, no urgency or frequency.  No flank pain, no hematuria   MS:  No joint pain or swelling.  No decreased range of motion.  No back pain.    Physical Exam  BP (!) 144/84 (BP Location: Left Arm, Cuff Size: Normal)   Pulse 78   Temp 98 F (36.7 C) (Temporal)   Ht 5' 7.5" (1.715 m)   Wt 185 lb 9.6 oz (84.2 kg)   SpO2 100% Comment: RA  BMI 28.64 kg/m   GEN: A/Ox3; pleasant , NAD, elderly   HEENT:  [place]/AT,   NOSE-clear, THROAT-clear, no lesions, no postnasal drip or exudate noted.   NECK:  Supple w/ fair ROM; no JVD; normal carotid impulses w/o bruits; no thyromegaly  or nodules palpated; no lymphadenopathy.    RESP  Clear  P & A; w/o, wheezes/ rales/ or rhonchi. no accessory muscle use, no dullness to percussion  CARD:  RRR, no m/r/g, no peripheral edema, pulses intact, no cyanosis or clubbing.  GI:   Soft & nt; nml bowel sounds; no organomegaly or masses detected.   Musco: Warm bil, no deformities or joint swelling noted.   Neuro: alert, no focal deficits noted.    Skin: Warm, no lesions or rashes    Lab Results:  CBC    Component Value Date/Time   WBC 5.8 05/03/2019 1542   RBC 3.82 (L) 05/03/2019 1542   HGB 14.0 05/03/2019 1542   HCT 40.7 05/03/2019 1542   PLT 194.0 05/03/2019 1542   MCV 106.4 (H) 05/03/2019 1542   MCV 102.6 (A) 02/01/2014 1635   MCH 35.6 (H) 10/09/2017 0627   MCHC 34.3 05/03/2019 1542   RDW 12.7 05/03/2019 1542   LYMPHSABS 0.7 05/03/2019 1542   MONOABS 0.8 05/03/2019 1542   EOSABS 0.1 05/03/2019 1542   BASOSABS 0.0 05/03/2019 1542    BMET    Component Value Date/Time   NA 130 (L) 05/03/2019 1542   K 4.2 05/03/2019 1542   CL 92 (L) 05/03/2019 1542   CO2 30 05/03/2019 1542   GLUCOSE 104 (H) 05/03/2019 1542   BUN 16 05/03/2019 1542   CREATININE 0.83 05/03/2019 1542   CREATININE 1.1 10/04/2015 1438   CALCIUM 9.9 05/03/2019 1542   GFRNONAA >60 05/22/2015 0450   GFRAA >60 05/22/2015 0450    BNP No results found for: BNP  ProBNP    Component Value Date/Time   PROBNP 66.0 05/03/2019 1542    Imaging: VAS [place] LOWER EXTREMITY VENOUS (DVT)  Result Date: 05/05/2019  Lower Venous Study Indications: SOB. Other Indications: Positive D-Dimer. Performing Technologist: Alvia Grove RVT  Examination Guidelines: A complete evaluation includes B-mode imaging, spectral Doppler, color Doppler, and power Doppler as needed of all accessible portions of each vessel. Bilateral testing is considered an integral part of a complete examination. Limited examinations for reoccurring indications may be performed as noted.   +---------+---------------+---------+-----------+----------+--------------+ RIGHT    CompressibilityPhasicitySpontaneityPropertiesThrombus Aging +---------+---------------+---------+-----------+----------+--------------+ CFV      Full           Yes      Yes                                 +---------+---------------+---------+-----------+----------+--------------+ SFJ      Full           Yes      Yes                                 +---------+---------------+---------+-----------+----------+--------------+ FV Prox  Full           Yes      Yes                                 +---------+---------------+---------+-----------+----------+--------------+ FV Mid   Full           Yes      Yes                                 +---------+---------------+---------+-----------+----------+--------------+ FV DistalFull           Yes      Yes                                 +---------+---------------+---------+-----------+----------+--------------+ PFV      Full           Yes      Yes                                 +---------+---------------+---------+-----------+----------+--------------+  POP      Full           Yes      Yes                                 +---------+---------------+---------+-----------+----------+--------------+ PTV      Full           Yes      Yes                                 +---------+---------------+---------+-----------+----------+--------------+ PERO     Full           Yes      Yes                                 +---------+---------------+---------+-----------+----------+--------------+ Gastroc  Full           Yes      Yes                                 +---------+---------------+---------+-----------+----------+--------------+ GSV      Full           Yes      Yes                                 +---------+---------------+---------+-----------+----------+--------------+ SSV      Full           Yes      Yes                                  +---------+---------------+---------+-----------+----------+--------------+   +---------+---------------+---------+-----------+----------+--------------+ LEFT     CompressibilityPhasicitySpontaneityPropertiesThrombus Aging +---------+---------------+---------+-----------+----------+--------------+ CFV      Full           Yes      Yes                                 +---------+---------------+---------+-----------+----------+--------------+ SFJ      Full           Yes      Yes                                 +---------+---------------+---------+-----------+----------+--------------+ FV Prox  Full           Yes      Yes                                 +---------+---------------+---------+-----------+----------+--------------+ FV Mid   Full           Yes      Yes                                 +---------+---------------+---------+-----------+----------+--------------+ FV DistalFull           Yes      Yes                                 +---------+---------------+---------+-----------+----------+--------------+  PFV      Full           Yes      Yes                                 +---------+---------------+---------+-----------+----------+--------------+ POP      Full           Yes      Yes                                 +---------+---------------+---------+-----------+----------+--------------+ PTV      Full           Yes      Yes                                 +---------+---------------+---------+-----------+----------+--------------+ PERO     Full           Yes      Yes                                 +---------+---------------+---------+-----------+----------+--------------+ Gastroc  Full           Yes      Yes                                 +---------+---------------+---------+-----------+----------+--------------+ GSV      Full           Yes      Yes                                  +---------+---------------+---------+-----------+----------+--------------+ SSV      Full           Yes      Yes                                 +---------+---------------+---------+-----------+----------+--------------+    Findings reported to [place] at 3:30.  Summary: Right: There is no evidence of acute deep or superficial vein thrombosis in the lower extremity. Left: There is no evidence of acute deep or superfical vein thrombosis in the lower extremity.  *See table(s) above for measurements and observations. Electronically signed by Ruta Hinds MD on 05/05/2019 at 4:08:58 PM.    Final       PFT Results Latest Ref Rng & Units 10/20/2014  FVC-Pre L 4.21  FVC-Predicted Pre % 104  FVC-Post L 4.34  FVC-Predicted Post % 107  Pre FEV1/FVC % % 74  Post FEV1/FCV % % 71  FEV1-Pre L 3.10  FEV1-Predicted Pre % 105  FEV1-Post L 3.07  DLCO UNC% % 85  DLCO COR %Predicted % 87  TLC L 6.96  TLC % Predicted % 103  RV % Predicted % 112    No results found for: NITRICOXIDE      Assessment & Plan:   DOE (dyspnea on exertion) Dyspnea mainly with heavy activity such as inclines and carrying heavy objects.  Patient has no desaturations with ambulation.  D-dimer was minimally elevated which is nonspecific with his age.  Venous Dopplers  were negative.  Low suspicion for PE. Suspect he has a component of deconditioning and also has a decrease in his lung function since status post left upper lobectomy.  CT chest in June 2020 showed stable changes no evidence of recurrent disease and stable radiation changes.  Patient has an upcoming CT chest next week.  We will have patient complete PFTs on return.  Plan  Patient Instructions  Activity as tolerated.  Continue on current regimen .  Follow up for CT chest as planned next week.  Follow up with Dr. Melvyn Novas  In 6 weeks with PFT and As needed   Please contact office for sooner follow up if symptoms do not improve or worsen or seek emergency care          S/P lobectomy of lung Status post lobectomy and radiation therapy for lung cancer 2016.  Keep follow-up CT for next week.     Rexene Edison, NP 06/02/2019

## 2019-06-08 ENCOUNTER — Other Ambulatory Visit: Payer: Self-pay

## 2019-06-08 ENCOUNTER — Encounter: Payer: Self-pay | Admitting: Surgery

## 2019-06-08 ENCOUNTER — Ambulatory Visit
Admission: RE | Admit: 2019-06-08 | Discharge: 2019-06-08 | Disposition: A | Discharge status: Home / Self Care | Payer: PPO | Source: Ambulatory Visit | Attending: Surgery | Admitting: Surgery

## 2019-06-08 ENCOUNTER — Ambulatory Visit (INDEPENDENT_AMBULATORY_CARE_PROVIDER_SITE_OTHER): Payer: PPO | Admitting: Surgery

## 2019-06-08 VITALS — BP 172/98 | HR 57 | Temp 97.3°F | Resp 16 | Ht 68.0 in | Wt 185.0 lb

## 2019-06-08 DIAGNOSIS — C3490 Malignant neoplasm of unspecified part of unspecified bronchus or lung: Secondary | ICD-10-CM | POA: Diagnosis not present

## 2019-06-08 DIAGNOSIS — I7121 Aneurysm of the ascending aorta, without rupture: Secondary | ICD-10-CM

## 2019-06-08 DIAGNOSIS — Z923 Personal history of irradiation: Secondary | ICD-10-CM

## 2019-06-08 DIAGNOSIS — Z08 Encounter for follow-up examination after completed treatment for malignant neoplasm: Secondary | ICD-10-CM

## 2019-06-08 DIAGNOSIS — C3412 Malignant neoplasm of upper lobe, left bronchus or lung: Secondary | ICD-10-CM

## 2019-06-08 DIAGNOSIS — Z902 Acquired absence of lung [part of]: Secondary | ICD-10-CM

## 2019-06-08 DIAGNOSIS — I712 Thoracic aortic aneurysm, without rupture: Secondary | ICD-10-CM

## 2019-06-08 DIAGNOSIS — Z85118 Personal history of other malignant neoplasm of bronchus and lung: Secondary | ICD-10-CM

## 2019-06-08 NOTE — Progress Notes (Signed)
HPI:  The patient returns for follow up s/p left upper lobectomy for a pT1b, pN0 stage IA well differentiated adenocarcinoma on 05/21/2015. Hedid not have afissure between the upper and lower lobes and the stapled resection margin was positive. He was not a candidate for a completion pneumonectomy and received XRT to 66 gray in 39 fractionsby Dr. Alcide Goodness on 08/16/2015.   Since I last saw him in June 2020 he has been feeling well.  He denies any headaches or visual changes.  He denies any cough or sputum production.  He has had some exertional shortness of breath particularly [place].  He has not been as active over the past 9 months as he had been prior to COVID-19.  He has been seeing Dr. Melvyn Novas.  Current Outpatient Medications  Medication Sig Dispense Refill  . Calcium Carb-Cholecalciferol (CALCIUM 600 + D PO) Take 600 mg by mouth daily.    . carvedilol (COREG) 6.25 MG tablet TAKE 1 TABLET BY MOUTH TWICE DAILY. 180 tablet 0  . famotidine (PEPCID) 20 MG tablet Take 1 tablet (20 mg total) by mouth at bedtime. 30 tablet 11  . hydrochlorothiazide (MICROZIDE) 12.5 MG capsule Take 1 capsule (12.5 mg total) by mouth 3 (three) times a week. 12 capsule 6  . Multiple Vitamin (MULTIVITAMIN WITH MINERALS) TABS tablet Take 2 tablets by mouth daily.    . pantoprazole (PROTONIX) 40 MG tablet Take 30-39min before first meal of the day 30 tablet 11  . potassium chloride SA (K-DUR,KLOR-CON) 20 MEQ tablet 20 mEq 2 (two) times daily.     . rosuvastatin (CRESTOR) 10 MG tablet Take 5 mg by mouth daily. In AM    . telmisartan (MICARDIS) 80 MG tablet Take 1 tablet (80 mg total) by mouth daily. 90 tablet 3   Current Facility-Administered Medications  Medication Dose Route Frequency Provider Last Rate Last Admin  . 0.9 %  sodium chloride infusion  500 mL Intravenous Continuous Armbruster, Carlota Raspberry, MD         Physical Exam: BP (!) 172/98   Pulse (!) 57    Temp (!) 97.3 F (36.3 C)   Resp 16   Ht 5\' 8"  (1.727 m)   Wt 185 lb (83.9 kg)   SpO2 98% Comment: RA  BMI 28.13 kg/m  He looks well. There is no cervical or supraclavicular adenopathy.  Lungs are clear. Cardiac exam shows a regular rate and rhythm with normal heart sounds.   Diagnostic Tests:  CLINICAL DATA:  Lung cancer.  History of melanoma.  EXAM: CT CHEST WITHOUT CONTRAST  TECHNIQUE: Multidetector CT imaging of the chest was performed following the standard protocol without IV contrast.  COMPARISON:  11/24/2018.  FINDINGS: Cardiovascular: Atherosclerotic calcification of the aorta, aortic valve and coronary arteries. Ascending aorta measures 4.1 cm. Pulmonic trunk is enlarged. Heart size normal. No pericardial effusion.  Mediastinum/Nodes: No pathologically enlarged mediastinal or axillary lymph nodes. Hilar regions are difficult to definitively evaluate without IV contrast. Esophagus is grossly unremarkable.  Lungs/Pleura: Left upper lobectomy. Post treatment parenchymal consolidation, volume loss and bronchiectasis in the superior aspect of the left lower lobe, stable. Small subpleural nodules in the right hemithorax measure up to 3 mm, unchanged and likely subpleural lymph nodes. No pleural fluid. Airway is otherwise unremarkable.  Upper Abdomen: Visualized portion of the liver is decreased in attenuation diffusely. Visualized portions of the liver, gallbladder and adrenal glands are otherwise unremarkable. Low and  high attenuation lesions in the kidneys measure up to 1.4 cm on the right and are too small to characterize. Visualized portions of the kidneys, spleen, pancreas, stomach and bowel are otherwise unremarkable. No upper abdominal adenopathy. There are calcified periportal lymph nodes.  Musculoskeletal: Degenerative changes in the spine. No worrisome lytic or sclerotic lesions.  IMPRESSION: 1. No evidence of recurrent or metastatic  disease status post left upper lobectomy. 2. Ascending Aortic aneurysm NOS (ICD10-I71.9), stable. Recommend annual imaging followup by CTA or MRA. This recommendation follows 2010 ACCF/AHA/AATS/ACR/ASA/SCA/SCAI/SIR/STS/SVM Guidelines for the Diagnosis and Management of Patients with Thoracic Aortic Disease. Circulation. 2010; 121: K932-I712. Aortic aneurysm NOS (ICD10-I71.9). 3. Hepatic steatosis. 4. Aortic atherosclerosis (ICD10-170.0). Coronary artery calcification. 5. Enlarged pulmonic trunk, indicative of pulmonary arterial hypertension.   Electronically Signed   By: Lorin Picket M.D.   On: 06/08/2019 09:51   Impression:  He is doing well for years postoperatively with no evidence of recurrent or metastatic disease on CT scan of the chest today.  He has a stable, small 4.1 cm fusiform ascending aortic aneurysm that does not require any treatment.  I reviewed the CT images with him and answered his questions.  I recommended that we do a follow-up scan in 1 year.  Plan:  He is going to continue to follow-up with Dr. Sondra Come and Dr. Leonides Schanz.  I will plan to see him back in 1 year with a CT scan of the chest.  I spent 15 minutes performing this established patient evaluation and > 50% of this time was spent face to face counseling and coordinating the surveillance of this patient's previous lung cancer.    Gaye Pollack, MD [facility] [phone]

## 2019-07-12 DIAGNOSIS — C44319 Basal cell carcinoma of skin of other parts of face: Secondary | ICD-10-CM | POA: Diagnosis not present

## 2019-07-12 DIAGNOSIS — Z85828 Personal history of other malignant neoplasm of skin: Secondary | ICD-10-CM | POA: Diagnosis not present

## 2019-07-24 ENCOUNTER — Ambulatory Visit: Payer: PPO

## 2019-07-25 ENCOUNTER — Other Ambulatory Visit (HOSPITAL_COMMUNITY)
Admission: RE | Admit: 2019-07-25 | Discharge: 2019-07-25 | Disposition: A | Discharge status: Home / Self Care | Payer: PPO | Source: Ambulatory Visit | Attending: Adult Health | Admitting: Adult Health

## 2019-07-25 DIAGNOSIS — Z01812 Encounter for preprocedural laboratory examination: Secondary | ICD-10-CM | POA: Insufficient documentation

## 2019-07-25 DIAGNOSIS — Z20822 Contact with and (suspected) exposure to covid-19: Secondary | ICD-10-CM | POA: Insufficient documentation

## 2019-07-25 LAB — SARS CORONAVIRUS 2 (TAT 6-24 HRS): SARS Coronavirus 2: NEGATIVE

## 2019-07-26 DIAGNOSIS — H8103 Meniere's disease, bilateral: Secondary | ICD-10-CM | POA: Diagnosis not present

## 2019-07-26 DIAGNOSIS — H6121 Impacted cerumen, right ear: Secondary | ICD-10-CM | POA: Diagnosis not present

## 2019-07-26 DIAGNOSIS — H903 Sensorineural hearing loss, bilateral: Secondary | ICD-10-CM | POA: Diagnosis not present

## 2019-07-29 ENCOUNTER — Encounter: Payer: Self-pay | Admitting: Internal Medicine

## 2019-07-29 ENCOUNTER — Ambulatory Visit: Payer: PPO | Admitting: Internal Medicine

## 2019-07-29 ENCOUNTER — Other Ambulatory Visit: Payer: Self-pay

## 2019-07-29 ENCOUNTER — Ambulatory Visit: Payer: PPO

## 2019-07-29 ENCOUNTER — Ambulatory Visit (INDEPENDENT_AMBULATORY_CARE_PROVIDER_SITE_OTHER): Payer: PPO | Admitting: Internal Medicine

## 2019-07-29 ENCOUNTER — Other Ambulatory Visit: Payer: Self-pay | Admitting: Cardiovascular Disease

## 2019-07-29 DIAGNOSIS — J449 Chronic obstructive pulmonary disease, unspecified: Secondary | ICD-10-CM

## 2019-07-29 DIAGNOSIS — R0600 Dyspnea, unspecified: Secondary | ICD-10-CM

## 2019-07-29 DIAGNOSIS — I10 Essential (primary) hypertension: Secondary | ICD-10-CM | POA: Diagnosis not present

## 2019-07-29 DIAGNOSIS — R0609 Other forms of dyspnea: Secondary | ICD-10-CM

## 2019-07-29 LAB — PULMONARY FUNCTION TEST
DL/VA % pred: 89 %
DL/VA: 3.54 ml/min/mmHg/L
DLCO unc % pred: 82 %
DLCO unc: 19.06 ml/min/mmHg
FEF 25-75 Post: 1.22 L/sec
FEF 25-75 Pre: 1.27 L/sec
FEF2575-%Change-Post: -3 %
FEF2575-%Pred-Post: 64 %
FEF2575-%Pred-Pre: 67 %
FEV1-%Change-Post: 3 %
FEV1-%Pred-Post: 88 %
FEV1-%Pred-Pre: 85 %
FEV1-Post: 2.39 L
FEV1-Pre: 2.31 L
FEV1FVC-%Change-Post: 4 %
FEV1FVC-%Pred-Pre: 92 %
FEV6-%Change-Post: -1 %
FEV6-%Pred-Post: 96 %
FEV6-%Pred-Pre: 98 %
FEV6-Post: 3.41 L
FEV6-Pre: 3.47 L
FEV6FVC-%Change-Post: 0 %
FEV6FVC-%Pred-Post: 106 %
FEV6FVC-%Pred-Pre: 107 %
FVC-%Change-Post: 0 %
FVC-%Pred-Post: 90 %
FVC-%Pred-Pre: 91 %
FVC-Post: 3.44 L
FVC-Pre: 3.47 L
Post FEV1/FVC ratio: 70 %
Post FEV6/FVC ratio: 99 %
Pre FEV1/FVC ratio: 66 %
Pre FEV6/FVC Ratio: 100 %
RV % pred: 71 %
RV: 1.8 L
TLC % pred: 81 %
TLC: 5.43 L

## 2019-07-29 MED ORDER — STIOLTO RESPIMAT 2.5-2.5 MCG/ACT IN AERS: 2.0000 | INHALATION_SPRAY | Freq: Every day | RESPIRATORY_TRACT | 0 refills | Status: DC

## 2019-07-29 MED ORDER — STIOLTO RESPIMAT 2.5-2.5 MCG/ACT IN AERS: 2.0000 | INHALATION_SPRAY | Freq: Every day | RESPIRATORY_TRACT | 11 refills | Status: DC

## 2019-07-29 NOTE — Assessment & Plan Note (Signed)
Quit smoking in 1983 - s/p LULobectomy 04/2015  PFT's  07/29/2019  FEV1 2.39 (88 % ) ratio 0.70  p 3 % improvement from saba p nothing  prior to study with DLCO  19.06 (82%) corrects to 3.54 (89%)  for alv volume and FV curve classic mild concavity  - 07/29/2019  After extensive coaching inhaler device,  effectiveness =    75% smi try stiolto x sample  His copd is so mild I doubt he will benefit from maint bronchodilators but due to chronic hoarseness I rec he avoid any inhalers that might muddy the picture and stiolto is probably the best choice > if not better ex tol (like "high octane fuel) would not fill rx   O/w f/u is 6 m, sooner if needed         Each maintenance medication was reviewed in detail including emphasizing most importantly the difference between maintenance and prns and under what circumstances the prns are to be triggered using an action plan format where appropriate.  Total time for H and P, chart review, counseling, teaching hew device and generating customized AVS unique to this office visit / charting = 30

## 2019-07-29 NOTE — Progress Notes (Signed)
George Montoya., male    DOB: March 02, 1941,     MRN: [id number]   Brief patient profile:  89 yowm quit smoking 1983 s/p LULobectomy 04/2015 [facility])  and good residual ex tol    in [place] but at 4500 feet when toting wood less activity tolerance esp since March 2020 when retired to [employer] where lives most of the time now.  Self referred to pulmonary clinic 05/03/2019 for doe   [facility] note 11/24/2018  s/p left upper lobectomy for a pT1b, pN0 stage IA well differentiated adenocarcinoma on 05/21/2015. [place] not have afissure between the upper and lower lobes and the stapled resection margin was positive. He was not a candidate for a completion pneumonectomy and received XRT to 66 gray in 60 fractionsby Dr. Alcide Goodness on 08/16/2015.     History of Present Illness  05/03/2019  Pulmonary/ 1st office eval/Rodderick Holtzer  Chief Complaint  Patient presents with  . Pulmonary Consult    Self referral. Pt c/o DOE for the past several years.   Dyspnea:  None in [place] but not "toting wood" up incline  no assoc cp/ diaphoresis   Cough: onset every since surgery some worse over the last year occ white mucus sporadic, not in am, not seasonal or assoc with wheeze or obvious nasal symptoms Sleep: fine here and [place]  rec Pantoprazole (protonix) 40 mg   Take  30-60 min before first meal of the day and Pepcid (famotidine)  20 mg one @  bedtime until return to office - this is the best way to tell whether stomach acid is contributing to your problem.   GERD diet  Pace yourself to lower pace with goal of keeping your above 90% (pulse oximeter)   Please schedule a follow up office visit in 4 weeks, sooner if needed  Add:    Rec change coreg to bisoprolol 5 mg daily and venous dopplers to be complete    07/29/2019  f/u ov/Jonn Chaikin re:  GOLD 0 COPD  Chief Complaint  Patient presents with  . Follow-up    PFT's done today. Breathing is unchanged since the last visit.   Dyspnea:  No change but hasn't  really been pushing himself since came back from [place] where needed to tote wood up inclines Cough: none Sleeping: flat ok  SABA use: none  02: none    No obvious day to day or daytime variability or assoc excess/ purulent sputum or mucus plugs or hemoptysis or cp or chest tightness, subjective wheeze or overt sinus or hb symptoms.   Sleeping  without nocturnal  or early am exacerbation  of respiratory  c/o's or need for noct saba. Also denies any obvious fluctuation of symptoms with weather or environmental changes or other aggravating or alleviating factors except as outlined above   No unusual exposure hx or h/o childhood pna/ asthma or knowledge of premature birth.  Current Allergies, Complete Past Medical History, Past Surgical History, Family History, and Social History were reviewed in [employer] record.  ROS  The following are not active complaints unless bolded Hoarseness, sore throat, dysphagia, dental problems, itching, sneezing,  nasal congestion or discharge of excess mucus or purulent secretions, ear ache,   fever, chills, sweats, unintended wt loss or wt gain, classically pleuritic or exertional cp,  orthopnea pnd or arm/hand swelling  or leg swelling, presyncope, palpitations, abdominal pain, anorexia, nausea, vomiting, diarrhea  or change in bowel habits or change in bladder habits, change in stools or change  in urine, dysuria, hematuria,  rash, arthralgias, visual complaints, headache, numbness, weakness or ataxia or problems with walking or coordination,  change in mood or  memory.        Current Meds  Medication Sig  . Calcium Carb-Cholecalciferol (CALCIUM 600 + D PO) Take 600 mg by mouth daily.  . carvedilol (COREG) 6.25 MG tablet TAKE 1 TABLET BY MOUTH TWICE DAILY.  . famotidine (PEPCID) 20 MG tablet Take 1 tablet (20 mg total) by mouth at bedtime.  . Multiple Vitamin (MULTIVITAMIN WITH MINERALS) TABS tablet Take 2 tablets by mouth daily.  .  pantoprazole (PROTONIX) 40 MG tablet Take 30-76min before first meal of the day  . potassium chloride SA (K-DUR,KLOR-CON) 20 MEQ tablet 20 mEq 2 (two) times daily.   . rosuvastatin (CRESTOR) 10 MG tablet Take 5 mg by mouth daily. In AM  . telmisartan (MICARDIS) 80 MG tablet Take 1 tablet (80 mg total) by mouth daily.           Past Medical History:  Diagnosis Date  . Aortic aneurysm (HCC)    DR BARTLE    JUST WATCHING   . Arthritis    spine  . Bowel obstruction ([place])   . GERD (gastroesophageal reflux disease)   . Headache    hx of 2 migraines as a college roommates  . High cholesterol   . Hypertension   . Melanoma ([place])    also basal cell carcinomas  . Meniere's disease of left ear   . Radiation 07/03/2015-08/16/2015   Left Mid Chest, surgical clips 66 gray  . Scoliosis deformity of spine   . Shingles    04/2014     RIGHT EYE, SCALP  . Shingles    right eye, forehead and head  . Shortness of breath dyspnea    WITH EXERTION   . Sleep related leg cramps   . Syncopal episodes    x 1 in October, 2015.       Objective:     Wt Readings from Last 3 Encounters:  07/29/19 189 lb (85.7 kg)  06/08/19 185 lb (83.9 kg)  06/02/19 185 lb 9.6 oz (84.2 kg)     Vital signs reviewed - Note on arrival 02 sats  97% on RA     Hoarse wm nad (neg ent better with ST)     HEENT : pt wearing mask not removed for exam due to covid -19 concerns.    NECK :  without JVD/Nodes/TM/ nl carotid upstrokes bilaterally   LUNGS: no acc muscle use,  Nl contour chest which is clear to A and P bilaterally without cough on insp or exp maneuvers   CV:  RRR  no s3 or murmur or increase in P2, and  Trace sym ankle edema   ABD:  soft and nontender with nl inspiratory excursion in the supine position. No bruits or organomegaly appreciated, bowel sounds nl  MS:  Nl gait/ ext warm without deformities, calf tenderness, cyanosis or clubbing No obvious joint restrictions   SKIN: warm and dry without  lesions    NEURO:  alert, approp, nl sensorium with  no motor or cerebellar deficits apparent.          I personally reviewed images and agree with radiology impression as follows:   Chest CT w/o contrast 06/08/19 Left upper lobectomy. Post treatment parenchymal consolidation, volume loss and bronchiectasis in the superior aspect of the left lower lobe, stable. Small subpleural nodules in the right hemithorax measure up to  3 mm, unchanged and likely subpleural lymph nodes. No pleural fluid. Airway is otherwise unremarkable.              Assessment

## 2019-07-29 NOTE — Assessment & Plan Note (Signed)
In the setting of respiratory symptoms of unknown etiology,  It would be preferable to use bystolic, the most beta -1  selective Beta blocker available in sample form, with bisoprolol the most selective generic choice  on the market, at least on a trial basis, to make sure the spillover Beta 2 effects of the less specific Beta blockers are not contributing to this patient's symptoms.   For now leave on coreg but low threshold to change to bisoprolol if higher doses needed

## 2019-07-29 NOTE — Progress Notes (Signed)
Full PFT performed today. °

## 2019-07-29 NOTE — Patient Instructions (Addendum)
stiolto 2 puffs each am to see if your exercise tolerance improves and if so fill the prescription   Work on inhaler technique:  relax and gently blow all the way out then take a nice smooth deep breath back in, triggering the inhaler at same time you start breathing in.  Hold for up to 5 seconds if you can.  Rinse and gargle with water when done       Please schedule a follow up visit in 6  months but call sooner if needed

## 2019-07-29 NOTE — Assessment & Plan Note (Signed)
Quit smoking 1983 - nl lung function 10/20/14 - s/p LULobectomy 04/2015 with RT s impact on ex tol - ? Worse since March 2020 when moved to 4500 ft - 05/03/2019   [facility] RA  2 laps @  approx 262ft each @ fast pace  stopped due to  End of study, mild sob, sats still 96%   - 05/03/19 D dimer 1.1 > venous dopplers done 05/05/2019 and neg bilaterally   No additonal w/u for now awaiting whether or not responds to stiolto

## 2019-08-04 ENCOUNTER — Ambulatory Visit: Payer: PPO

## 2019-08-04 ENCOUNTER — Telehealth: Payer: Self-pay | Admitting: Internal Medicine

## 2019-08-04 NOTE — Telephone Encounter (Signed)
Called and spoke with pt. Pt said the stiolto is not a covered inhaler with his insurance. Stated to pt to contact insurance company to have them provide him a formulary list so we can see what inhalers are covered with his insurance and he said he would. Pt will call us back once he speaks with insurance company in regards to covered inhalers. Will await a return call.

## 2019-08-05 MED ORDER — STIOLTO RESPIMAT 2.5-2.5 MCG/ACT IN AERS: 2.0000 | INHALATION_SPRAY | Freq: Every day | RESPIRATORY_TRACT | 0 refills | Status: DC

## 2019-08-05 NOTE — Telephone Encounter (Signed)
Patient states insurance will cover Advair, Anoro Ellipta, and Budesonide.  Also would like samples of Stiolto.  Pharmacy is [facility]. Can do a prior authorization for the Stiolto inhaler.

## 2019-08-05 NOTE — Telephone Encounter (Signed)
Looking through pt's recent meds, it does not look like he has tried/failed either advair, anoro, or budesonide. Due to this, if we attempted a PA, pt's med will probably be denied due to not having any alternative therapies tried.   Dr. Melvyn Novas, please advise which inhaler from the list provided by pt of covered inhalers that you want to switch pt to.

## 2019-08-05 NOTE — Telephone Encounter (Signed)
Give him stiolto sample then ov before they run out so can teach the different devices

## 2019-08-05 NOTE — Telephone Encounter (Signed)
Called spoke with patient and discussed Dr Gustavus Bryant recommendations as stated below.  Patient very grateful and voiced his understanding.  Patient unable to come next week because he is going out of town.  MW not in the office the following week - appt scheduled with Tammy NP for 2.25.21 @ 1330.  #1 Stiolto sample left up front for patient to pick up at his convenience. Sample documented per protocol. Nothing further needed at this time; will sign off.

## 2019-08-17 ENCOUNTER — Encounter: Payer: Self-pay | Admitting: Cardiovascular Disease

## 2019-08-17 ENCOUNTER — Other Ambulatory Visit: Payer: Self-pay

## 2019-08-17 ENCOUNTER — Ambulatory Visit: Payer: PPO | Admitting: Cardiovascular Disease

## 2019-08-17 VITALS — BP 166/88 | HR 68 | Temp 96.7°F | Ht 68.0 in | Wt 189.6 lb

## 2019-08-17 DIAGNOSIS — Z87898 Personal history of other specified conditions: Secondary | ICD-10-CM | POA: Diagnosis not present

## 2019-08-17 DIAGNOSIS — I10 Essential (primary) hypertension: Secondary | ICD-10-CM

## 2019-08-17 MED ORDER — HYDROCHLOROTHIAZIDE 12.5 MG PO CAPS: ORAL_CAPSULE | ORAL | 6 refills | Status: DC

## 2019-08-17 NOTE — Progress Notes (Signed)
Patient ID: George Montoya., male   DOB: October 07, 1940, 79 y.o.   MRN: [id number]     Reason for office visit Hypertension, history of syncope  He has not had any new syncopal events since his last appointment and general feels very well.  Its been over a year since he last had syncope.  He has lost hearing in his left ear almost completely due to Mnire's disease and has had some attacks of vertigo.  He was having some issues with shortness of breath, especially when at [place] [facility], but after starting an inhaler his dyspnea and wheezing have improved substantially.  He has been using samples of Stiolto, but these are not approved by his insurance and his pulmonary specialist is going to provide a different prescription.  He does take a small to moderate dose of carvedilol.  He quit smoking almost 20 years ago.    His blood pressure at home earlier today was 144/82, but he will frequently see systolic blood pressure over 865 and diastolic blood pressure up to 95.  He has now had 3 syncopal events.  In 2015 at work he had an episode of syncope that was probably associated with hypotension due to an acute diarrheal illness.  It was associated with some prodromal symptoms.  However he has had syncope twice this year in March and in April.  The episodes were similar.  They both occurred when he jumped out of bed to use the restroom at around 2 or 3:00 in the morning.  He does not recall dizziness, lightheadedness, diaphoresis, nausea, flushing or any of the typical prodromal symptoms of a vasovagal event.  On both occasions he had head impact, had bleeding lacerations and required stitches, thankfully no intracranial bleeding.  Neither episode was associated with tonic-clonic movements, incontinence or postictal state.  The first time he was in his daughter's home and thinks he was simply unfamiliar with the surroundings and hit an obstacle.  The second syncopal event however  happened in his home.  The patient specifically denies any chest pain at rest exertion, dyspnea at rest or with exertion, orthopnea, paroxysmal nocturnal dyspnea, syncope, palpitations, focal neurological deficits, intermittent claudication, lower extremity edema, unexplained weight gain, cough, hemoptysis or wheezing.  Echocardiogram in 2015 was essentially a normal study.  He had a normal stress test in 2016.  Blood pressure control has been good.  He wore an event monitor in June 2019 with completely normal results.  He has mild ectasia of the ascending thoracic aorta measured at 4 cm by CT.  He has previous undergone a left upper lobectomy for a stage I well-differentiated adenocarcinoma of the lung in 2016, followed by XRT. He has chronic XRT related changes in the left lower lobe.  No Known Allergies  Current Outpatient Medications  Medication Sig Dispense Refill  . Calcium Carb-Cholecalciferol (CALCIUM 600 + D PO) Take 600 mg by mouth daily.    . carvedilol (COREG) 6.25 MG tablet TAKE 1 TABLET BY MOUTH TWICE DAILY. 180 tablet 0  . Multiple Vitamin (MULTIVITAMIN WITH MINERALS) TABS tablet Take 2 tablets by mouth daily.    . potassium chloride SA (K-DUR,KLOR-CON) 20 MEQ tablet 20 mEq 2 (two) times daily.     . rosuvastatin (CRESTOR) 10 MG tablet Take 5 mg by mouth daily. In AM    . telmisartan (MICARDIS) 80 MG tablet Take 1 tablet (80 mg total) by mouth daily. 90 tablet 3  . famotidine (PEPCID) 20  MG tablet Take 1 tablet (20 mg total) by mouth at bedtime. 30 tablet 11  . hydrochlorothiazide (MICROZIDE) 12.5 MG capsule Take one tablet by mouth 5 days a week. 20 capsule 6  . pantoprazole (PROTONIX) 40 MG tablet Take 30-46min before first meal of the day 30 tablet 11  . umeclidinium-vilanterol (ANORO ELLIPTA) 62.5-25 MCG/INH AEPB Inhale 1 puff into the lungs daily. 7 each 0  . umeclidinium-vilanterol (ANORO ELLIPTA) 62.5-25 MCG/INH AEPB Inhale 1 puff into the lungs daily. 90 each 1   Current  Facility-Administered Medications  Medication Dose Route Frequency Provider Last Rate Last Admin  . 0.9 %  sodium chloride infusion  500 mL Intravenous Continuous Armbruster, Carlota Raspberry, MD        Past Medical History:  Diagnosis Date  . Aortic aneurysm (HCC)    DR BARTLE    JUST WATCHING   . Arthritis    spine  . Bowel obstruction ([place])   . GERD (gastroesophageal reflux disease)   . Headache    hx of 2 migraines as a college roommates  . High cholesterol   . Hypertension   . Melanoma ([place])    also basal cell carcinomas  . Meniere's disease of left ear   . Radiation 07/03/2015-08/16/2015   Left Mid Chest, surgical clips 66 gray  . Scoliosis deformity of spine   . Shingles    04/2014     RIGHT EYE, SCALP  . Shingles    right eye, forehead and head  . Shortness of breath dyspnea    WITH EXERTION   . Sleep related leg cramps   . Syncopal episodes    x 1 in October, 2015.    Past Surgical History:  Procedure Laterality Date  . APPENDECTOMY    . CATARACT EXTRACTION W/ INTRAOCULAR LENS  IMPLANT, BILATERAL    . COLON SURGERY     6 YRS AGO  "SNIPPED BAND BINDING INTESTINE"  . COLONOSCOPY    . HERNIA REPAIR    . MELANOMA EXCISION    . pilonadal cyst removed    . surgery for bowel obstruction    . THORACOTOMY/LOBECTOMY Left 05/21/2015   Procedure: LEFT THORACOTOMY/LEFT UPPER LOBECTOMY;  Surgeon: Gaye Pollack, MD;  Location: [facility] OR;  Service: Thoracic;  Laterality: Left;  . TONSILLECTOMY    . VASECTOMY    . VIDEO BRONCHOSCOPY WITH ENDOBRONCHIAL NAVIGATION N/A 10/18/2014   Procedure: VIDEO BRONCHOSCOPY WITH ENDOBRONCHIAL NAVIGATION;  Surgeon: Collene Gobble, MD;  Location: [facility] OR;  Service: Thoracic;  Laterality: N/A;    Family History  Problem Relation Age of Onset  . Breast cancer Mother   . Prostate cancer Father     Social History   Socioeconomic History  . Marital status: Married    Spouse name: Not on file  . Number of children: Not on file  . Years of  education: Not on file  . Highest education level: Not on file  Occupational History  . Occupation: executive  Tobacco Use  . Smoking status: Former Smoker    Packs/day: 2.00    Years: 25.00    Pack years: 50.00    Types: Cigarettes    Quit date: 10/21/1981    Years since quitting: 37.8  . Smokeless tobacco: Never Used  Substance and Sexual Activity  . Alcohol use: Yes    Alcohol/week: 20.0 standard drinks    Types: 20 Standard drinks or equivalent per week    Comment: occ  . Drug use: No  .  Sexual activity: Not on file  Other Topics Concern  . Not on file  Social History Narrative  . Not on file   Social Determinants of Health   Financial Resource Strain:   . Difficulty of Paying Living Expenses: Not on file  Food Insecurity:   . Worried About Charity fundraiser in the Last Year: Not on file  . Ran Out of Food in the Last Year: Not on file  Transportation Needs:   . Lack of Transportation (Medical): Not on file  . Lack of Transportation (Non-Medical): Not on file  Physical Activity:   . Days of Exercise per Week: Not on file  . Minutes of Exercise per Session: Not on file  Stress:   . Feeling of Stress : Not on file  Social Connections:   . Frequency of Communication with Friends and Family: Not on file  . Frequency of Social Gatherings with Friends and Family: Not on file  . Attends Religious Services: Not on file  . Active Member of Clubs or Organizations: Not on file  . Attends Archivist Meetings: Not on file  . Marital Status: Not on file  Intimate Partner Violence:   . Fear of Current or Ex-Partner: Not on file  . Emotionally Abused: Not on file  . Physically Abused: Not on file  . Sexually Abused: Not on file    Review of systems: See history of present illness. All other systems are reviewed and are negative   PHYSICAL EXAM BP (!) 166/88   Pulse 68   Temp (!) 96.7 F (35.9 C)   Ht 5\' 8"  (1.727 m)   Wt 189 lb 9.6 oz (86 kg)   SpO2 99%    BMI 28.83 kg/m  Blood pressure rechecked 15 minutes later was still high at 152/81. General: Alert, oriented x3, no distress, appears younger than his stated age and fit Head: no evidence of trauma, PERRL, EOMI, no exophtalmos or lid lag, no myxedema, no xanthelasma; normal ears, nose and oropharynx Neck: normal jugular venous pulsations and no hepatojugular reflux; brisk carotid pulses without delay and no carotid bruits Chest: clear to auscultation, no signs of consolidation by percussion or palpation, normal fremitus, symmetrical and full respiratory excursions Cardiovascular: normal position and quality of the apical impulse, regular rhythm, normal first and second heart sounds, no murmurs, rubs or gallops Abdomen: no tenderness or distention, no masses by palpation, no abnormal pulsatility or arterial bruits, normal bowel sounds, no hepatosplenomegaly Extremities: no clubbing, cyanosis or edema; 2+ radial, ulnar and brachial pulses bilaterally; 2+ right femoral, posterior tibial and dorsalis pedis pulses; 2+ left femoral, posterior tibial and dorsalis pedis pulses; no subclavian or femoral bruits Neurological: grossly nonfocal Psych: Normal mood and affect  ECG: ECG is ordered today shows normal sinus rhythm, normal tracing Lipid Panel  No results found for: CHOL, TRIG, HDL, CHOLHDL, VLDL, LDLCALC, LDLDIRECT  04/19/2019 Total cholesterol 198, HDL 91, LDL 79, triglycerides 142  BMET    Component Value Date/Time   NA 130 (L) 05/03/2019 1542   K 4.2 05/03/2019 1542   CL 92 (L) 05/03/2019 1542   CO2 30 05/03/2019 1542   GLUCOSE 104 (H) 05/03/2019 1542   BUN 16 05/03/2019 1542   CREATININE 0.83 05/03/2019 1542   CREATININE 1.1 10/04/2015 1438   CALCIUM 9.9 05/03/2019 1542   GFRNONAA >60 05/22/2015 0450   GFRAA >60 05/22/2015 0450  Hemoglobin 14.0   ASSESSMENT AND PLAN  1. Syncope: A year has passed since  his last event.  He does not always have prodromal symptoms and may have  arrhythmic syncope, but other features suggest possible vasovagal syncope.  I encouraged him to have an implantable loop recorder, but he has decided to postpone that unless he has another event. 2. HTN: His blood pressure is consistently high and I am particularly worried about instances of fairly significant diastolic hypertension.  Would not increase his carvedilol due to his problems with reactive airway disease.  He is on a maximum dose of ARB.  Try to avoid excessive diuretics due to his history of vasovagal syncope, but will increase the hydrochlorothiazide to 5 days a week. Patient Instructions  Medication Instructions:  INCREASE the Hydrochlorothiazide 12.5 mg  to 5 days weekly  *If you need a refill on your cardiac medications before your next appointment, please call your pharmacy*  Lab Work: None ordered If you have labs (blood work) drawn today and your tests are completely normal, you will receive your results only by: Marland Kitchen MyChart Message (if you have MyChart) OR . A paper copy in the mail If you have any lab test that is abnormal or we need to change your treatment, we will call you to review the results.  Testing/Procedures: None ordered  Follow-Up: At [facility], you and your health needs are our priority.  As part of our continuing mission to provide you with exceptional heart care, we have created designated Provider Care Teams.  These Care Teams include your primary Cardiologist (physician) and Advanced Practice Providers (APPs -  Physician Assistants and Nurse Practitioners) who all work together to provide you with the care you need, when you need it.  Your next appointment:   6 month(s)  The format for your next appointment:   In Person  Provider:   You may see Sanda Klein, MD or one of the following Advanced Practice Providers on your designated Care Team:    Almyra Deforest, PA-C  Fabian Sharp, PA-C or   Roby Lofts, [place]   Other Instructions Dr.  Sallyanne Kuster would like you to check your blood pressure daily for the next 3 weeks.  Keep a journal of these daily blood pressure and heart rate readings and call our office or send a message through [place] with the results. Thank you!     Meds ordered this encounter  Medications  . hydrochlorothiazide (MICROZIDE) 12.5 MG capsule    Sig: Take one tablet by mouth 5 days a week.    Dispense:  20 capsule    Refill:  6    Kaneisha Ellenberger  Sanda Klein, MD, [facility] [phone] office [phone] pager

## 2019-08-17 NOTE — Patient Instructions (Addendum)
Medication Instructions:  INCREASE the Hydrochlorothiazide 12.5 mg  to 5 days weekly  *If you need a refill on your cardiac medications before your next appointment, please call your pharmacy*  Lab Work: None ordered If you have labs (blood work) drawn today and your tests are completely normal, you will receive your results only by: Marland Kitchen MyChart Message (if you have MyChart) OR . A paper copy in the mail If you have any lab test that is abnormal or we need to change your treatment, we will call you to review the results.  Testing/Procedures: None ordered  Follow-Up: At [facility], you and your health needs are our priority.  As part of our continuing mission to provide you with exceptional heart care, we have created designated Provider Care Teams.  These Care Teams include your primary Cardiologist (physician) and Advanced Practice Providers (APPs -  Physician Assistants and Nurse Practitioners) who all work together to provide you with the care you need, when you need it.  Your next appointment:   6 month(s)  The format for your next appointment:   In Person  Provider:   You may see Sanda Klein, MD or one of the following Advanced Practice Providers on your designated Care Team:    Almyra Deforest, PA-C  Fabian Sharp, PA-C or   Roby Lofts, [place]   Other Instructions Dr. Sallyanne Kuster would like you to check your blood pressure daily for the next 3 weeks.  Keep a journal of these daily blood pressure and heart rate readings and call our office or send a message through [place] with the results. Thank you!

## 2019-08-17 NOTE — Progress Notes (Signed)
Radiation Oncology         [id number]) [phone] ________________________________  Name: George Montoya. MRN: [id number]  Date: 08/18/2019  DOB: [birth year]  Follow-Up Visit Note  CC: Leanna Battles, MD  Gaye Pollack, MD    ICD-10-CM   1. Primary cancer of left upper lobe of lung (HCC)  C34.12     Diagnosis:   79 y.o. 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   He underwent follow up chest CT on 06/08/2019, which showed no evidence of recurrent or metastatic disease. These results were reviewed with Dr. Cyndia Bent at follow up that day.  Per Dr. Vivi Martens note, the patient will return for follow up in 05/2020 with repeat chest CT at that time.  On review of systems, he admits to being a little short of breath since his last follow-up.  He attributes this to approximately a 15 pound weight gain and inactivity related to Covid.  He did meet with Dr. Melvyn Novas and was diagnosed with COPD.  The patient was placed on inhaler which is been  very helpful for his breathing.  [place] in the spring and hopes to begin walking outside for exercise.  He denies any pain within the chest or hemoptysis.  He denies any headaches or new bony pain.  ALLERGIES:  has No Known Allergies.  Meds: Current Outpatient Medications  Medication Sig Dispense Refill  . Calcium Carb-Cholecalciferol (CALCIUM 600 + D PO) Take 600 mg by mouth daily.    . carvedilol (COREG) 6.25 MG tablet TAKE 1 TABLET BY MOUTH TWICE DAILY. 180 tablet 0  . hydrochlorothiazide (MICROZIDE) 12.5 MG capsule Take one tablet by mouth 5 days a week. 20 capsule 6  . Multiple Vitamin (MULTIVITAMIN WITH MINERALS) TABS tablet Take 2  tablets by mouth daily.    . potassium chloride SA (K-DUR,KLOR-CON) 20 MEQ tablet 20 mEq 2 (two) times daily.     . rosuvastatin (CRESTOR) 10 MG tablet Take 5 mg by mouth daily. In AM    . telmisartan (MICARDIS) 80 MG tablet Take 1 tablet (80 mg total) by mouth daily. 90 tablet 3  . Tiotropium Bromide-Olodaterol (STIOLTO RESPIMAT) 2.5-2.5 MCG/ACT AERS Inhale 2 puffs into the lungs daily. 4 g 11  . Tiotropium Bromide-Olodaterol (STIOLTO RESPIMAT) 2.5-2.5 MCG/ACT AERS Inhale 2 puffs into the lungs daily. 4 g 0  . famotidine (PEPCID) 20 MG tablet Take 1 tablet (20 mg total) by mouth at bedtime. (Patient not taking: Reported on 08/17/2019) 30 tablet 11  . pantoprazole (PROTONIX) 40 MG tablet Take 30-22min before first meal of the day (Patient not taking: Reported on 08/17/2019) 30 tablet 11   Current Facility-Administered Medications  Medication Dose Route Frequency Provider Last Rate Last Admin  . 0.9 %  sodium chloride infusion  500 mL Intravenous Continuous Armbruster, Carlota Raspberry, MD        Physical Findings: The patient is in no acute distress. Patient is alert and oriented.  weight is 188 lb 12.8 oz (85.6 kg). His temperature is 97.8 F (36.6 C). His blood pressure is 149/87 (abnormal) and his pulse is 82. His respiration is 20 and oxygen saturation is 100%.   The patient has mild  wheezing with deep inspiration on the left side. Right lung clear. Heart has regular rate and rhythm. No palpable cervical, supraclavicular, or axillary adenopathy. Abdomen soft, non-tender, normal bowel sounds.   Lab Findings: Lab Results  Component Value Date   WBC 5.8 05/03/2019   HGB 14.0 05/03/2019   HCT 40.7 05/03/2019   MCV 106.4 (H) 05/03/2019   PLT 194.0 05/03/2019    Radiographic Findings: No results found.  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   No evidence of recurrence on clinical exam.  He has been diagnosed with COPD,   inhalers help with this issue.  I encouraged him to be more active with walking outside.  Plan:  Routine follow-up in radiation oncology in 6 months.  This will likely be his last follow-up in radiation oncology.  He will undergo a chest CT scan in December and then follow-up with Dr. Cyndia Bent at that time.  ____________________________________  Blair Promise, PhD, MD  This document serves as a record of services personally performed by Gery Pray, MD. It was created on his behalf by Wilburn Mylar, a trained medical scribe. The creation of this record is based on the scribe's personal observations and the provider's statements to them. This document has been checked and approved by the attending provider.

## 2019-08-18 ENCOUNTER — Other Ambulatory Visit: Payer: Self-pay

## 2019-08-18 ENCOUNTER — Ambulatory Visit: Payer: PPO | Admitting: Adult Health

## 2019-08-18 ENCOUNTER — Ambulatory Visit
Admission: RE | Admit: 2019-08-18 | Discharge: 2019-08-18 | Disposition: A | Discharge status: Home / Self Care | Payer: PPO | Source: Ambulatory Visit | Attending: Radiation Oncology | Admitting: Radiation Oncology

## 2019-08-18 ENCOUNTER — Encounter: Payer: Self-pay | Admitting: Radiation Oncology

## 2019-08-18 ENCOUNTER — Encounter: Payer: Self-pay | Admitting: Adult Health

## 2019-08-18 VITALS — BP 149/87 | HR 82 | Temp 97.8°F | Resp 20 | Wt 188.8 lb

## 2019-08-18 DIAGNOSIS — Z85118 Personal history of other malignant neoplasm of bronchus and lung: Secondary | ICD-10-CM | POA: Diagnosis not present

## 2019-08-18 DIAGNOSIS — Z923 Personal history of irradiation: Secondary | ICD-10-CM | POA: Insufficient documentation

## 2019-08-18 DIAGNOSIS — C3412 Malignant neoplasm of upper lobe, left bronchus or lung: Secondary | ICD-10-CM | POA: Diagnosis not present

## 2019-08-18 DIAGNOSIS — J449 Chronic obstructive pulmonary disease, unspecified: Secondary | ICD-10-CM

## 2019-08-18 DIAGNOSIS — Z79899 Other long term (current) drug therapy: Secondary | ICD-10-CM | POA: Diagnosis not present

## 2019-08-18 DIAGNOSIS — Z8616 Personal history of COVID-19: Secondary | ICD-10-CM | POA: Diagnosis not present

## 2019-08-18 DIAGNOSIS — Z08 Encounter for follow-up examination after completed treatment for malignant neoplasm: Secondary | ICD-10-CM | POA: Diagnosis not present

## 2019-08-18 MED ORDER — ANORO ELLIPTA 62.5-25 MCG/INH IN AEPB: 1.0000 | INHALATION_SPRAY | Freq: Every day | RESPIRATORY_TRACT | 1 refills | Status: DC

## 2019-08-18 MED ORDER — ANORO ELLIPTA 62.5-25 MCG/INH IN AEPB: 1.0000 | INHALATION_SPRAY | Freq: Every day | RESPIRATORY_TRACT | 5 refills | Status: DC

## 2019-08-18 MED ORDER — ANORO ELLIPTA 62.5-25 MCG/INH IN AEPB: 1.0000 | INHALATION_SPRAY | Freq: Every day | RESPIRATORY_TRACT | 0 refills | Status: DC

## 2019-08-18 NOTE — Patient Instructions (Addendum)
Change Stiolto to Anoro 1 puff daily Activity as tolerated Follow-up with Dr. Melvyn Novas in 4 to 6 months and as needed

## 2019-08-18 NOTE — Patient Instructions (Signed)
Coronavirus (COVID-19) Are you at risk?  Are you at risk for the Coronavirus (COVID-19)?  To be considered HIGH RISK for Coronavirus (COVID-19), you have to meet the following criteria:  . Traveled to [place], [place], [place], [place] or [place]; or in the [place] to [place], [place], [place], or [place]; and have fever, cough, and shortness of breath within the last 2 weeks of travel OR . Been in close contact with a person diagnosed with COVID-19 within the last 2 weeks and have fever, cough, and shortness of breath . IF YOU DO NOT MEET THESE CRITERIA, YOU ARE CONSIDERED LOW RISK FOR COVID-19.  What to do if you are HIGH RISK for COVID-19?  . If you are having a medical emergency, call 911. . Seek medical care right away. Before you go to a doctor's office, urgent care or emergency department, call ahead and tell them about your recent travel, contact with someone diagnosed with COVID-19, and your symptoms. You should receive instructions from your physician's office regarding next steps of care.  . When you arrive at healthcare provider, tell the healthcare staff immediately you have returned from visiting [place], [place], [place], [place] or [place]; or traveled in the [place] to [place], [place], [place], or [place]; in the last two weeks or you have been in close contact with a person diagnosed with COVID-19 in the last 2 weeks.   . Tell the health care staff about your symptoms: fever, cough and shortness of breath. . After you have been seen by a medical provider, you will be either: o Tested for (COVID-19) and discharged home on quarantine except to seek medical care if symptoms worsen, and asked to  - Stay home and avoid contact with others until you get your results (4-5 days)  - Avoid travel on public transportation if possible (such as bus, train, or airplane) or o Sent to the Emergency Department by EMS for evaluation, COVID-19 testing, and possible  admission depending on your condition and test results.  What to do if you are LOW RISK for COVID-19?  Reduce your risk of any infection by using the same precautions used for avoiding the common cold or flu:  . Wash your hands often with soap and warm water for at least 20 seconds.  If soap and water are not readily available, use an alcohol-based hand sanitizer with at least 60% alcohol.  . If coughing or sneezing, cover your mouth and nose by coughing or sneezing into the elbow areas of your shirt or coat, into a tissue or into your sleeve (not your hands). . Avoid shaking hands with others and consider head nods or verbal greetings only. . Avoid touching your eyes, nose, or mouth with unwashed hands.  . Avoid close contact with people who are sick. . Avoid places or events with large numbers of people in one location, like concerts or sporting events. . Carefully consider travel plans you have or are making. . If you are planning any travel outside or inside the [place], visit the CDC's Travelers' Health webpage for the latest health notices. . If you have some symptoms but not all symptoms, continue to monitor at home and seek medical attention if your symptoms worsen. . If you are having a medical emergency, call 911.   ADDITIONAL HEALTHCARE OPTIONS FOR PATIENTS  [facility] / e-Visit: [email or web address]         [facility]: [phone]  [facility]: [phone]                   [facility]: [phone]   

## 2019-08-18 NOTE — Progress Notes (Signed)
@Patient  ID: George Montoya., male    DOB: 03-13-1941, 79 y.o.   MRN: [id number]  Chief Complaint  Patient presents with  . Follow-up    COPD     Referring provider: Leanna Battles, MD  HPI: 79 year old male former smoker (1983) with previous history of stage I well-differentiated adenocarcinoma status post left upper lobectomy status post XRT.-Noticed 2016 and finished radiation 2017 Found to have stage I COPD Medical history significant for melanoma  TEST/EVENTS :  PFTs July 29, 2019 FEV1 85%, ratio 66, FVC 91% (prebronchodilator), DLCO 82%, no significant bronchodilator response positive mid flow obstruction CT chest June 08, 2019 no evidence of recurrent or metastatic disease status post left upper lobectomy  08/18/2019 Follow up : COPD , Hx of Lung cancer s/p resection/XRT  Patient presents for a follow-up visit.  Patient had pulmonary function testing earlier this month.  Was found to have some mild COPD.  He was started on Stiolto.  Patient says it has helped with decreased symptom burden-shortness of breath.  Unfortunately insurance would not cover this.  We contacted his insurance and formulary says it will cover Anoro.  We discussed changing to the alternative. He denies any increased cough or wheezing.  As above CT chest in December showed no evidence of recurrent or metastatic disease.  Patient has a planned CT and December 2021.  Overall says he is doing well.  He tries to be some active since retiring earlier this year.  Says that he is going to start walking as soon as the weather improves.  Has a place up at [facility] that he visits regularly.  Has had both his Covid vaccines  No Known Allergies  Immunization History  Administered Date(s) Administered  . Influenza, High Dose Seasonal PF 03/21/2019  . Moderna SARS-COVID-2 Vaccination 07/14/2019, 08/10/2019  . Pneumococcal-Unspecified 06/23/2004  . Tdap 08/30/2017  . Zoster Recombinat  (Shingrix) 06/01/2017, 06/23/2017, 10/26/2017, 12/30/2017    Past Medical History:  Diagnosis Date  . Aortic aneurysm (HCC)    DR BARTLE    JUST WATCHING   . Arthritis    spine  . Bowel obstruction ([place])   . GERD (gastroesophageal reflux disease)   . Headache    hx of 2 migraines as a college roommates  . High cholesterol   . Hypertension   . Melanoma ([place])    also basal cell carcinomas  . Meniere's disease of left ear   . Radiation 07/03/2015-08/16/2015   Left Mid Chest, surgical clips 66 gray  . Scoliosis deformity of spine   . Shingles    04/2014     RIGHT EYE, SCALP  . Shingles    right eye, forehead and head  . Shortness of breath dyspnea    WITH EXERTION   . Sleep related leg cramps   . Syncopal episodes    x 1 in October, 2015.    Tobacco History: Social History   Tobacco Use  Smoking Status Former Smoker  . Packs/day: 2.00  . Years: 25.00  . Pack years: 50.00  . Types: Cigarettes  . Quit date: 10/21/1981  . Years since quitting: 37.8  Smokeless Tobacco Never Used   Counseling given: Not Answered   Outpatient Medications Prior to Visit  Medication Sig Dispense Refill  . Calcium Carb-Cholecalciferol (CALCIUM 600 + D PO) Take 600 mg by mouth daily.    . carvedilol (COREG) 6.25 MG tablet TAKE 1 TABLET BY MOUTH TWICE DAILY. 180 tablet 0  . famotidine (PEPCID)  20 MG tablet Take 1 tablet (20 mg total) by mouth at bedtime. 30 tablet 11  . hydrochlorothiazide (MICROZIDE) 12.5 MG capsule Take one tablet by mouth 5 days a week. 20 capsule 6  . Multiple Vitamin (MULTIVITAMIN WITH MINERALS) TABS tablet Take 2 tablets by mouth daily.    . pantoprazole (PROTONIX) 40 MG tablet Take 30-61min before first meal of the day 30 tablet 11  . potassium chloride SA (K-DUR,KLOR-CON) 20 MEQ tablet 20 mEq 2 (two) times daily.     . rosuvastatin (CRESTOR) 10 MG tablet Take 5 mg by mouth daily. In AM    . telmisartan (MICARDIS) 80 MG tablet Take 1 tablet (80 mg total) by mouth  daily. 90 tablet 3  . Tiotropium Bromide-Olodaterol (STIOLTO RESPIMAT) 2.5-2.5 MCG/ACT AERS Inhale 2 puffs into the lungs daily. 4 g 11  . Tiotropium Bromide-Olodaterol (STIOLTO RESPIMAT) 2.5-2.5 MCG/ACT AERS Inhale 2 puffs into the lungs daily. 4 g 0   Facility-Administered Medications Prior to Visit  Medication Dose Route Frequency Provider Last Rate Last Admin  . 0.9 %  sodium chloride infusion  500 mL Intravenous Continuous Armbruster, Carlota Raspberry, MD         Review of Systems:   Constitutional:   No  weight loss, night sweats,  Fevers, chills, fatigue, or  lassitude.  HEENT:   No headaches,  Difficulty swallowing,  Tooth/dental problems, or  Sore throat,                No sneezing, itching, ear ache, nasal congestion, post nasal drip,   CV:  No chest pain,  Orthopnea, PND, swelling in lower extremities, anasarca, dizziness, palpitations, syncope.   GI  No heartburn, indigestion, abdominal pain, nausea, vomiting, diarrhea, change in bowel habits, loss of appetite, bloody stools.   Resp: No shortness of breath with exertion or at rest.  No excess mucus, no productive cough,  No non-productive cough,  No coughing up of blood.  No change in color of mucus.  No wheezing.  No chest wall deformity  Skin: no rash or lesions.  GU: no dysuria, change in color of urine, no urgency or frequency.  No flank pain, no hematuria   MS:  No joint pain or swelling.  No decreased range of motion.  No back pain.    Physical Exam  BP (!) 154/78 (BP Location: Left Arm, Cuff Size: Normal)   Pulse 73   Temp 97.9 F (36.6 C) (Temporal)   Ht 5\' 8"  (1.727 m)   Wt 189 lb (85.7 kg)   SpO2 99% Comment: RA  BMI 28.74 kg/m   GEN: A/Ox3; pleasant , NAD, well nourished    HEENT:  [place]/AT,  , NOSE-clear, THROAT-clear, no lesions, no postnasal drip or exudate noted.   NECK:  Supple w/ fair ROM; no JVD; normal carotid impulses w/o bruits; no thyromegaly or nodules palpated; no lymphadenopathy.    RESP   Clear  P & A; w/o, wheezes/ rales/ or rhonchi. no accessory muscle use, no dullness to percussion  CARD:  RRR, no m/r/g, no peripheral edema, pulses intact, no cyanosis or clubbing.  GI:   Soft & nt; nml bowel sounds; no organomegaly or masses detected.   Musco: Warm bil, no deformities or joint swelling noted.   Neuro: alert, no focal deficits noted.    Skin: Warm, no lesions or rashes    Lab Results:  CBC  BNP No results found for: BNP  ProBNP    Component Value Date/Time  PROBNP 66.0 05/03/2019 1542    Imaging: No results found.    PFT Results Latest Ref Rng & Units 07/29/2019 10/20/2014  FVC-Pre L 3.47 4.21  FVC-Predicted Pre % 91 104  FVC-Post L 3.44 4.34  FVC-Predicted Post % 90 107  Pre FEV1/FVC % % 66 74  Post FEV1/FCV % % 70 71  FEV1-Pre L 2.31 3.10  FEV1-Predicted Pre % 85 105  FEV1-Post L 2.39 3.07  DLCO UNC% % 82 85  DLCO COR %Predicted % 89 87  TLC L 5.43 6.96  TLC % Predicted % 81 103  RV % Predicted % 71 112    No results found for: NITRICOXIDE      Assessment & Plan:   COPD GOLD 0 Stage I COPD improved symptom control on LABA LAMA combo.  Will change from Stiolto to [employer] due to insurance coverage  Plan  Patient Instructions  Change Stiolto to Anoro 1 puff daily Activity as tolerated Follow-up with Dr. Melvyn Novas in 4 to 6 months and as needed      Primary cancer of left upper lobe of lung ([place]) Adenocarcinoma diagnosed in 2016.  Status post resection and XRT.  Serial CT chest shows no evidence of recurrence.  Patient has a planned CT chest in December 2021.     Rexene Edison, NP 08/18/2019

## 2019-08-18 NOTE — Progress Notes (Signed)
Mr. Okuda presents today for f/u with Dr. Sondra Come. Pt denies c/o pain. Pt reports occasional cough with occasional clear phlegm. Denies hemoptysis. Pt reports SOB has improved on Stiolto inhaler. Pt reports insurance does not cover Stiolto so pt is meeting with pulm to discuss alternative today. Pt denies difficulty swallowing.   BP (!) 149/87 (BP Location: Right Arm, Patient Position: Sitting, Cuff Size: Large)   Pulse 82   Temp 97.8 F (36.6 C)   Resp 20   Wt 188 lb 12.8 oz (85.6 kg)   SpO2 100%   BMI 28.71 kg/m   Wt Readings from Last 3 Encounters:  08/18/19 188 lb 12.8 oz (85.6 kg)  08/17/19 189 lb 9.6 oz (86 kg)  07/29/19 189 lb (85.7 kg)   Loma Sousa, RN BSN

## 2019-08-18 NOTE — Addendum Note (Signed)
Addended by: Parke Poisson E on: 08/18/2019 02:34 PM   Modules accepted: Orders

## 2019-08-18 NOTE — Assessment & Plan Note (Signed)
Adenocarcinoma diagnosed in 2016.  Status post resection and XRT.  Serial CT chest shows no evidence of recurrence.  Patient has a planned CT chest in December 2021.

## 2019-08-18 NOTE — Assessment & Plan Note (Signed)
Stage I COPD improved symptom control on LABA LAMA combo.  Will change from Stiolto to [employer] due to insurance coverage  Plan  Patient Instructions  Change Stiolto to Anoro 1 puff daily Activity as tolerated Follow-up with Dr. Melvyn Novas in 4 to 6 months and as needed

## 2019-08-25 DIAGNOSIS — I2510 Atherosclerotic heart disease of native coronary artery without angina pectoris: Secondary | ICD-10-CM | POA: Diagnosis not present

## 2019-08-25 DIAGNOSIS — Z1331 Encounter for screening for depression: Secondary | ICD-10-CM | POA: Diagnosis not present

## 2019-08-25 DIAGNOSIS — C3412 Malignant neoplasm of upper lobe, left bronchus or lung: Secondary | ICD-10-CM | POA: Diagnosis not present

## 2019-08-25 DIAGNOSIS — I10 Essential (primary) hypertension: Secondary | ICD-10-CM | POA: Diagnosis not present

## 2019-08-25 DIAGNOSIS — R0609 Other forms of dyspnea: Secondary | ICD-10-CM | POA: Diagnosis not present

## 2019-09-13 DIAGNOSIS — Z20828 Contact with and (suspected) exposure to other viral communicable diseases: Secondary | ICD-10-CM | POA: Diagnosis not present

## 2019-09-14 ENCOUNTER — Ambulatory Visit: Payer: PPO | Admitting: Cardiovascular Disease

## 2019-09-23 ENCOUNTER — Telehealth: Payer: Self-pay | Admitting: Cardiovascular Disease

## 2019-09-23 NOTE — Telephone Encounter (Signed)
Contacted patient- he states that he was just told to call back with his blood pressure after the change on the 24th.  I advised I would send a message to Dr.C and if he had any changes we would let him know.  Patient states that he feels great, no issues.

## 2019-09-23 NOTE — Telephone Encounter (Signed)
HCTZ 12.5 mg every day please.

## 2019-09-23 NOTE — Telephone Encounter (Signed)
New message:    Patient calling to report BP 09/03/19 BP 141/86. 09/04/19 164/99, 09/05/19 153/93, 09/06/19 141/91, 09/07/19 148/93, 09/08/19 147/93, 09/09/19 142/92 09/10/19 148/77, 09/11/19 129/81, 09/12/19 150/88, 03, /23, /21 156/90, 09/14/19 124/82, //09/15/19 135/88, 09/16/19 121/80, 09/17/19 142/87, 09/18/19 125/82, 09/19/19 150/93, 09/20/19 146/88, 09/21/19 149/97,09/22/19 145/90,09/23/19 142/92.

## 2019-09-23 NOTE — Telephone Encounter (Signed)
Contacted patient, gave advise from MD.  Patient will monitor and call back in about 2 weeks.

## 2019-09-23 NOTE — Telephone Encounter (Signed)
BP not bad, but with DBP often over 90. Please increase the HCTZ to every day instead of 5 days a week.

## 2019-10-24 ENCOUNTER — Telehealth: Payer: Self-pay | Admitting: Cardiovascular Disease

## 2019-10-24 NOTE — Telephone Encounter (Signed)
Patient was advised to call back in 2 weeks with BP readings-  They are below. Thank you!

## 2019-10-24 NOTE — Telephone Encounter (Signed)
Thanks. Please leave meds as they are currently.

## 2019-10-24 NOTE — Telephone Encounter (Signed)
Patient is calling to give BP readings: 4/24 151/99  4/25 133/85 4/26 142/100 4/27 134/84 4/28 129/84 4/29 105/67 4/30 127/85 5/1   125/82 5/2   117/75 5/3   124/85

## 2019-10-25 NOTE — Telephone Encounter (Signed)
The patient has been made aware and verbalized his understanding.   The patient stated that his black outs have stopped and he is much better.

## 2019-10-26 ENCOUNTER — Other Ambulatory Visit: Payer: Self-pay | Admitting: Cardiovascular Disease

## 2019-12-01 DIAGNOSIS — Z85820 Personal history of malignant melanoma of skin: Secondary | ICD-10-CM | POA: Diagnosis not present

## 2019-12-01 DIAGNOSIS — L603 Nail dystrophy: Secondary | ICD-10-CM | POA: Diagnosis not present

## 2019-12-01 DIAGNOSIS — Z85828 Personal history of other malignant neoplasm of skin: Secondary | ICD-10-CM | POA: Diagnosis not present

## 2019-12-01 DIAGNOSIS — L570 Actinic keratosis: Secondary | ICD-10-CM | POA: Diagnosis not present

## 2019-12-01 DIAGNOSIS — L821 Other seborrheic keratosis: Secondary | ICD-10-CM | POA: Diagnosis not present

## 2019-12-01 DIAGNOSIS — D1801 Hemangioma of skin and subcutaneous tissue: Secondary | ICD-10-CM | POA: Diagnosis not present

## 2019-12-20 DIAGNOSIS — H00011 Hordeolum externum right upper eyelid: Secondary | ICD-10-CM | POA: Diagnosis not present

## 2020-01-24 ENCOUNTER — Other Ambulatory Visit: Payer: Self-pay | Admitting: Cardiovascular Disease

## 2020-01-24 DIAGNOSIS — H6123 Impacted cerumen, bilateral: Secondary | ICD-10-CM | POA: Diagnosis not present

## 2020-01-24 DIAGNOSIS — H8102 Meniere's disease, left ear: Secondary | ICD-10-CM | POA: Diagnosis not present

## 2020-01-25 ENCOUNTER — Ambulatory Visit: Payer: PPO | Admitting: Internal Medicine

## 2020-01-25 ENCOUNTER — Other Ambulatory Visit: Payer: Self-pay

## 2020-01-25 ENCOUNTER — Encounter: Payer: Self-pay | Admitting: Internal Medicine

## 2020-01-25 DIAGNOSIS — J449 Chronic obstructive pulmonary disease, unspecified: Secondary | ICD-10-CM | POA: Diagnosis not present

## 2020-01-25 NOTE — Progress Notes (Signed)
George Montoya., male    DOB: [birth year]     MRN: [id number]   Brief patient profile:  78 yowm quit smoking 1983 s/p LULobectomy 04/2015 [facility])  and good residual ex tol    in [place] but at 4500 feet when toting wood less activity tolerance esp since March 2020 when retired to [employer] where lives most of the time now.  Self referred to pulmonary clinic 05/03/2019 for doe   Bartles note 11/24/2018  s/p left upper lobectomy for a pT1b, pN0 stage IA well differentiated adenocarcinoma on 05/21/2015. Hedid not have afissure between the upper and lower lobes and the stapled resection margin was positive. He was not a candidate for a completion pneumonectomy and received XRT to 66 gray in 4 fractionsby George. Alcide Montoya on 08/16/2015.     History of Present Illness  05/03/2019  Pulmonary/ 1st office eval/George Montoya  Chief Complaint  Patient presents with  . Pulmonary Consult    Self referral. Pt c/o DOE for the past several years.   Dyspnea:  None in [place] but not "toting wood" up incline  no assoc cp/ diaphoresis   Cough: onset every since surgery some worse over the last year occ white mucus sporadic, not in am, not seasonal or assoc with wheeze or obvious nasal symptoms Sleep: fine here and [place]  rec Pantoprazole (protonix) 40 mg   Take  30-60 min before first meal of the day and Pepcid (famotidine)  20 mg one @  bedtime until return to office - this is the best way to tell whether stomach acid is contributing to your problem.   GERD diet  Pace yourself to lower pace with goal of keeping your above 90% (pulse oximeter)   Please schedule a follow up office visit in 4 weeks, sooner if needed  Add:    Rec change coreg to bisoprolol 5 mg daily and venous dopplers to be complete    07/29/2019  f/u ov/George Montoya re:  GOLD 0 COPD  Chief Complaint  Patient presents with  . Follow-up    PFT's done today. Breathing is unchanged since the last visit.   Dyspnea:  No change but hasn't  really been pushing himself since came back from [place] where needed to tote wood up inclines Cough: none Sleeping: flat ok  SABA use: none  02: none  rec Stiolto 2 puffs each am to see if your exercise tolerance improves and if so fill the prescription  Work on inhaler technique:  > changed to anoro > neither improved    01/25/2020  f/u ov/George Montoya re: copd 0 / no better on lama/laba  Chief Complaint  Patient presents with  . Follow-up    pt reports feeling at baseline status; no complaints. states here for 6 month check up   Dyspnea:  Only doe  [place]  Cough: none  Sleeping: fine flat SABA use: none 02: none    No obvious day to day or daytime variability or assoc excess/ purulent sputum or mucus plugs or hemoptysis or cp or chest tightness, subjective wheeze or overt sinus or hb symptoms.   Sleeping  without nocturnal  or early am exacerbation  of respiratory  c/o's or need for noct saba. Also denies any obvious fluctuation of symptoms with weather or environmental changes or other aggravating or alleviating factors except as outlined above   No unusual exposure hx or h/o childhood pna/ asthma or knowledge of premature birth.  Current Allergies, Complete Past Medical History, Past  Surgical History, Family History, and Social History were reviewed in [employer] record.  ROS  The following are not active complaints unless bolded Hoarseness, sore throat, dysphagia, dental problems, itching, sneezing,  nasal congestion or discharge of excess mucus or purulent secretions, ear ache,   fever, chills, sweats, unintended wt loss or wt gain, classically pleuritic or exertional cp,  orthopnea pnd or arm/hand swelling  or leg swelling, presyncope, palpitations, abdominal pain, anorexia, nausea, vomiting, diarrhea  or change in bowel habits or change in bladder habits, change in stools or change in urine, dysuria, hematuria,  rash, arthralgias, visual complaints,  headache, numbness, weakness or ataxia or problems with walking or coordination,  change in mood or  memory.        Current Meds  Medication Sig  . Calcium Carb-Cholecalciferol (CALCIUM 600 + D PO) Take 600 mg by mouth daily.  . carvedilol (COREG) 6.25 MG tablet TAKE 1 TABLET BY MOUTH TWICE DAILY.  . famotidine (PEPCID) 20 MG tablet Take 1 tablet (20 mg total) by mouth at bedtime.  . hydrochlorothiazide (MICROZIDE) 12.5 MG capsule Take one tablet by mouth 5 days a week.  . Multiple Vitamin (MULTIVITAMIN WITH MINERALS) TABS tablet Take 2 tablets by mouth daily.  . potassium chloride SA (K-DUR,KLOR-CON) 20 MEQ tablet 20 mEq 2 (two) times daily.   . rosuvastatin (CRESTOR) 10 MG tablet Take 5 mg by mouth daily. In AM  . telmisartan (MICARDIS) 80 MG tablet Take 1 tablet (80 mg total) by mouth daily.  . [DISCONTINUED] pantoprazole (PROTONIX) 40 MG tablet Take 30-68min before first meal of the day                Past Medical History:  Diagnosis Date  . Aortic aneurysm (HCC)    George Montoya    JUST WATCHING   . Arthritis    spine  . Bowel obstruction ([place])   . GERD (gastroesophageal reflux disease)   . Headache    hx of 2 migraines as a college roommates  . High cholesterol   . Hypertension   . Melanoma ([place])    also basal cell carcinomas  . Meniere's disease of left ear   . Radiation 07/03/2015-08/16/2015   Left Mid Chest, surgical clips 66 gray  . Scoliosis deformity of spine   . Shingles    04/2014     RIGHT EYE, SCALP  . Shingles    right eye, forehead and head  . Shortness of breath dyspnea    WITH EXERTION   . Sleep related leg cramps   . Syncopal episodes    x 1 in October, 2015.       Objective:     01/25/2020          180   07/29/19 189 lb (85.7 kg)  06/08/19 185 lb (83.9 kg)  06/02/19 185 lb 9.6 oz (84.2 kg)     Vital signs reviewed  01/25/2020  - Note at rest 02 sats  99% on RA    amb pleasant wm / trace pseudowheeze   HEENT : pt wearing mask not removed  for exam due to covid - 19 concerns.   NECK :  without JVD/Nodes/TM/ nl carotid upstrokes bilaterally   LUNGS: no acc muscle use,  Min barrel  contour chest wall with bilateral  slightly decreased bs s audible wheeze and  without cough on insp or exp maneuvers and min  Hyperresonant  to  percussion bilaterally     CV:  RRR  no s3 or murmur or increase in P2, and trace ankle pitting edema only    ABD:  soft and nontender with pos end  insp Hoover's  in the supine position. No bruits or organomegaly appreciated, bowel sounds nl  MS:   Nl gait/  ext warm without deformities, calf tenderness, cyanosis or clubbing No obvious joint restrictions   SKIN: warm and dry without lesions    NEURO:  alert, approp, nl sensorium with  no motor or cerebellar deficits apparent.                      Assessment

## 2020-01-25 NOTE — Patient Instructions (Addendum)
No additional tests are needed for your lungs nor any medications - breathe clean air    Pulmonary follow up is as needed

## 2020-01-26 ENCOUNTER — Encounter: Payer: Self-pay | Admitting: Internal Medicine

## 2020-01-26 NOTE — Assessment & Plan Note (Signed)
Quit smoking in 1983 - s/p LULobectomy 04/2015  PFT's  07/29/2019  FEV1 2.39 (88 % ) ratio 0.70  p 3 % improvement from saba p nothing  prior to study with DLCO  19.06 (82%) corrects to 3.54 (89%)  for alv volume and FV curve classic mild concavity  - 07/29/2019  After extensive coaching inhaler device,  effectiveness =    75% smi try stiolto x sample> no better, also no better on anoro so d/c'd   His only finding on exam is mild pseudowheezing for which he says he previously worked with speech therapy but declined referral back to ST at this poin t  Given mild severity of dz  And lack of tendency to aecopd>>> no pulmonary f/u needed.          Each maintenance medication was reviewed in detail including emphasizing most importantly the difference between maintenance and prns and under what circumstances the prns are to be triggered using an action plan format where appropriate.  Total time for H and P, chart review, counseling, teaching device and generating customized AVS unique to this office visit / charting =10 min

## 2020-01-27 ENCOUNTER — Ambulatory Visit: Payer: PPO | Admitting: Internal Medicine

## 2020-02-15 NOTE — Progress Notes (Signed)
Radiation Oncology         [phone]) [phone] ________________________________  Name: George Montoya. MRN: [id number]  Date: 02/16/2020  DOB: 10/21/1940  Follow-Up Visit Note  CC: Leanna Battles, MD  Leanna Battles, MD    ICD-10-CM   1. Nodule of left lung  R91.1   2. Primary cancer of left upper lobe of lung (HCC)  C34.12     Diagnosis:  Stage IB Well-differentiated adenocarcinoma of the left lung with positive surgical margins   Interval Since Last Radiation: Four years, six months, and three days.  07/03/2015 - 08/16/2015: Left mid chest, surgical clips / 66 gray in 33 fractions  Narrative:  The patient returns today for routine follow-up. Since his last visit, he was seen by Dr. Melvyn Novas on 01/25/2020. The only significant finding on examination was mild pseudowheezing, which he stated was previously evaluated by speech therapy. Given the mild severity of his disease and lack of acute exacerbation of COPD, no pulmonary follow-up was scheduled.  On review of systems, he reports a mild but persistent cough that is occasionally productive with clear phlegm. He denies shortness of breath and poor appetite.  Denies any pain within the chest area or hemoptysis    ALLERGIES:  has No Known Allergies.  Meds: Current Outpatient Medications  Medication Sig Dispense Refill   Calcium Carb-Cholecalciferol (CALCIUM 600 + D PO) Take 600 mg by mouth daily.     carvedilol (COREG) 6.25 MG tablet TAKE 1 TABLET BY MOUTH TWICE DAILY. 180 tablet 3   famotidine (PEPCID) 20 MG tablet Take 1 tablet (20 mg total) by mouth at bedtime. 30 tablet 11   hydrochlorothiazide (MICROZIDE) 12.5 MG capsule Take one tablet by mouth 5 days a week. 20 capsule 6   Multiple Vitamin (MULTIVITAMIN WITH MINERALS) TABS tablet Take 2 tablets by mouth daily.     potassium chloride SA (K-DUR,KLOR-CON) 20 MEQ tablet 20 mEq 2 (two) times daily.      rosuvastatin (CRESTOR) 10 MG tablet Take 5 mg by mouth daily. In AM       telmisartan (MICARDIS) 80 MG tablet Take 1 tablet (80 mg total) by mouth daily. 90 tablet 3   Current Facility-Administered Medications  Medication Dose Route Frequency Provider Last Rate Last Admin   0.9 %  sodium chloride infusion  500 mL Intravenous Continuous Armbruster, Carlota Raspberry, MD        Physical Findings: The patient is in no acute distress. Patient is alert and oriented.  height is 5\' 7"  (1.702 m) and weight is 181 lb (82.1 kg). His oral temperature is 98.3 F (36.8 C). His blood pressure is 154/83 (abnormal) and his pulse is 63. His respiration is 20 and oxygen saturation is 100%.   Lungs are clear to auscultation bilaterally. Heart has regular rate and rhythm. No palpable cervical, supraclavicular, or axillary adenopathy. Abdomen soft, non-tender, normal bowel sounds.  Examination of the back area reveals scars along the right upper back previous melanoma surgery.  No evidence of recurrence.   Lab Findings: Lab Results  Component Value Date   WBC 5.8 05/03/2019   HGB 14.0 05/03/2019   HCT 40.7 05/03/2019   MCV 106.4 (H) 05/03/2019   PLT 194.0 05/03/2019    Radiographic Findings: No results found.  Impression: Stage IB Well-differentiated adenocarcinoma of the left lung with positive surgical margins  No evidence of recurrence on clinical exam today.  Plan: Patient will undergo a chest CT scan in December and then follow-up with Dr.  Bartle.  Since the patient will be 5 years out from his treatment at that time I have deferred further follow-up.  He would likely follow-up with Dr. Cyndia Bent on a yearly basis with the chest CT scan afterward.  Total time spent in this encounter was 15 minutes which included reviewing the patient's most recent follow-ups, physical examination, and documentation.  ____________________________________  Blair Promise, PhD, MD  This document serves as a record of services personally performed by Gery Pray, MD. It was created on his  behalf by Clerance Lav, a trained medical scribe. The creation of this record is based on the scribe's personal observations and the provider's statements to them. This document has been checked and approved by the attending provider.

## 2020-02-16 ENCOUNTER — Other Ambulatory Visit: Payer: Self-pay

## 2020-02-16 ENCOUNTER — Encounter: Payer: Self-pay | Admitting: Radiation Oncology

## 2020-02-16 ENCOUNTER — Ambulatory Visit
Admission: RE | Admit: 2020-02-16 | Discharge: 2020-02-16 | Disposition: A | Discharge status: Home / Self Care | Payer: PPO | Source: Ambulatory Visit | Attending: Radiation Oncology | Admitting: Radiation Oncology

## 2020-02-16 DIAGNOSIS — Z79899 Other long term (current) drug therapy: Secondary | ICD-10-CM | POA: Diagnosis not present

## 2020-02-16 DIAGNOSIS — Z923 Personal history of irradiation: Secondary | ICD-10-CM | POA: Insufficient documentation

## 2020-02-16 DIAGNOSIS — C3412 Malignant neoplasm of upper lobe, left bronchus or lung: Secondary | ICD-10-CM

## 2020-02-16 DIAGNOSIS — R911 Solitary pulmonary nodule: Secondary | ICD-10-CM

## 2020-02-16 DIAGNOSIS — Z08 Encounter for follow-up examination after completed treatment for malignant neoplasm: Secondary | ICD-10-CM | POA: Diagnosis not present

## 2020-02-16 DIAGNOSIS — Z85118 Personal history of other malignant neoplasm of bronchus and lung: Secondary | ICD-10-CM | POA: Insufficient documentation

## 2020-02-16 DIAGNOSIS — R05 Cough: Secondary | ICD-10-CM | POA: Diagnosis not present

## 2020-02-16 NOTE — Progress Notes (Signed)
Patient here for a f/u visit with Dr. Sondra Come. Patient reports a persistent small cough occasionally productive of clear phlegm. Denies shortness of breath or problems with appetite.  BP (!) 154/83 (BP Location: Left Arm, Patient Position: Sitting)   Pulse 63   Temp 98.3 F (36.8 C) (Oral)   Resp 20   Ht 5\' 7"  (1.702 m)   Wt 181 lb (82.1 kg)   SpO2 100%   BMI 28.35 kg/m   Wt Readings from Last 3 Encounters:  02/16/20 181 lb (82.1 kg)  01/25/20 180 lb (81.6 kg)  08/18/19 188 lb 12.8 oz (85.6 kg)

## 2020-03-21 ENCOUNTER — Telehealth: Payer: Self-pay | Admitting: Cardiovascular Disease

## 2020-03-21 NOTE — Telephone Encounter (Signed)
Spoke with patient about scheduling follow up visit with Croitoru from recall lists and patient asked to be called back tomorrow

## 2020-03-25 ENCOUNTER — Other Ambulatory Visit: Payer: Self-pay | Admitting: Cardiovascular Disease

## 2020-03-30 DIAGNOSIS — H00011 Hordeolum externum right upper eyelid: Secondary | ICD-10-CM | POA: Diagnosis not present

## 2020-04-10 ENCOUNTER — Other Ambulatory Visit: Payer: Self-pay | Admitting: Otolaryngology

## 2020-04-10 DIAGNOSIS — J3801 Paralysis of vocal cords and larynx, unilateral: Secondary | ICD-10-CM | POA: Diagnosis not present

## 2020-04-10 DIAGNOSIS — R490 Dysphonia: Secondary | ICD-10-CM | POA: Diagnosis not present

## 2020-04-10 DIAGNOSIS — J3800 Paralysis of vocal cords and larynx, unspecified: Secondary | ICD-10-CM

## 2020-04-24 DIAGNOSIS — Z125 Encounter for screening for malignant neoplasm of prostate: Secondary | ICD-10-CM | POA: Diagnosis not present

## 2020-04-24 DIAGNOSIS — E785 Hyperlipidemia, unspecified: Secondary | ICD-10-CM | POA: Diagnosis not present

## 2020-04-25 ENCOUNTER — Ambulatory Visit
Admission: RE | Admit: 2020-04-25 | Discharge: 2020-04-25 | Disposition: A | Discharge status: Home / Self Care | Payer: PPO | Source: Ambulatory Visit | Attending: Otolaryngology | Admitting: Otolaryngology

## 2020-04-25 DIAGNOSIS — J3800 Paralysis of vocal cords and larynx, unspecified: Secondary | ICD-10-CM | POA: Diagnosis not present

## 2020-04-25 MED ORDER — IOPAMIDOL (ISOVUE-300) INJECTION 61%
75.0000 mL | Freq: Once | INTRAVENOUS | Status: AC | PRN
  Administered 2020-04-25: 75 mL via INTRAVENOUS

## 2020-04-30 ENCOUNTER — Other Ambulatory Visit: Payer: Self-pay | Admitting: Cardiovascular Disease

## 2020-05-01 DIAGNOSIS — E785 Hyperlipidemia, unspecified: Secondary | ICD-10-CM | POA: Diagnosis not present

## 2020-05-01 DIAGNOSIS — R030 Elevated blood-pressure reading, without diagnosis of hypertension: Secondary | ICD-10-CM | POA: Diagnosis not present

## 2020-05-01 DIAGNOSIS — H8109 Meniere's disease, unspecified ear: Secondary | ICD-10-CM | POA: Diagnosis not present

## 2020-05-01 DIAGNOSIS — C3412 Malignant neoplasm of upper lobe, left bronchus or lung: Secondary | ICD-10-CM | POA: Diagnosis not present

## 2020-05-01 DIAGNOSIS — Z0000 Encounter for general adult medical examination without abnormal findings: Secondary | ICD-10-CM | POA: Diagnosis not present

## 2020-05-01 DIAGNOSIS — I2510 Atherosclerotic heart disease of native coronary artery without angina pectoris: Secondary | ICD-10-CM | POA: Diagnosis not present

## 2020-05-01 DIAGNOSIS — R82998 Other abnormal findings in urine: Secondary | ICD-10-CM | POA: Diagnosis not present

## 2020-05-01 DIAGNOSIS — I10 Essential (primary) hypertension: Secondary | ICD-10-CM | POA: Diagnosis not present

## 2020-05-07 ENCOUNTER — Other Ambulatory Visit: Payer: Self-pay | Admitting: *Deleted

## 2020-05-07 DIAGNOSIS — Z08 Encounter for follow-up examination after completed treatment for malignant neoplasm: Secondary | ICD-10-CM

## 2020-05-14 DIAGNOSIS — L718 Other rosacea: Secondary | ICD-10-CM | POA: Diagnosis not present

## 2020-05-14 DIAGNOSIS — D485 Neoplasm of uncertain behavior of skin: Secondary | ICD-10-CM | POA: Diagnosis not present

## 2020-05-14 DIAGNOSIS — Z85828 Personal history of other malignant neoplasm of skin: Secondary | ICD-10-CM | POA: Diagnosis not present

## 2020-05-14 DIAGNOSIS — L738 Other specified follicular disorders: Secondary | ICD-10-CM | POA: Diagnosis not present

## 2020-05-16 DIAGNOSIS — Z1212 Encounter for screening for malignant neoplasm of rectum: Secondary | ICD-10-CM | POA: Diagnosis not present

## 2020-05-23 ENCOUNTER — Encounter: Payer: Self-pay | Admitting: Physician Assistant

## 2020-05-23 ENCOUNTER — Other Ambulatory Visit: Payer: Self-pay

## 2020-05-23 ENCOUNTER — Ambulatory Visit (INDEPENDENT_AMBULATORY_CARE_PROVIDER_SITE_OTHER): Payer: PPO | Admitting: Physician Assistant

## 2020-05-23 VITALS — BP 168/88 | HR 61 | Ht 67.5 in | Wt 179.0 lb

## 2020-05-23 DIAGNOSIS — I10 Essential (primary) hypertension: Secondary | ICD-10-CM | POA: Diagnosis not present

## 2020-05-23 DIAGNOSIS — Z87898 Personal history of other specified conditions: Secondary | ICD-10-CM

## 2020-05-23 DIAGNOSIS — E7800 Pure hypercholesterolemia, unspecified: Secondary | ICD-10-CM | POA: Diagnosis not present

## 2020-05-23 NOTE — Progress Notes (Signed)
Cardiology Office Note:    Date:  05/25/2020   ID:  George Handler., DOB 1940-07-15, MRN [id number]  PCP:  Leanna Battles, MD  [facility] Cardiologist:  Sanda Klein, MD  [place] Electrophysiologist:  None   Referring MD: Leanna Battles, MD   Chief Complaint  Patient presents with  . Follow-up    seen for Dr. Sallyanne Kuster    History of Present Illness:    George Onorato. is a 79 y.o. male with a hx of syncope, aortic aneurysm, GERD, HTN, HLD, lung cancer s/p left upper lobectomy, Mnire's disease and shingles. Patient had 3 syncopal events in the past. In 2015, he had syncope in the setting of acute diarrhea. Echocardiogram obtained in 2015 showed normal EF without significant valvular disease. Myoview in 2016 was normal. He wore a event monitor in June 2019 which did not show significant arrhythmia. He had 2 more episode of syncope in March and April 2020. Both episode occurred early in the morning when he tried to jump out of the bed to go to the bathroom. He was last seen by Dr. Sallyanne Kuster in February 2021, hydrochlorothiazide was increased to 5 days a week for elevated blood pressure. Dr. Sallyanne Kuster intentionally avoided excessive diuretic due to history of vasovagal syncope.  Patient presents today for follow-up.  Instead of 5 days weekly dosing of HCTZ, he is actually taking HCTZ daily.  He denies any recent dizziness or feeling of passing out.  He usually count to 15 before try to set up from the side of the bed.  He denies any chest pain or shortness of breath.  He has been exercising at home.  Due to history of Mnire's disease, he is somewhat unsteady on his feet when trying to bend forward.  He is very careful with body position changes.  Overall he has been doing well and can follow-up in 6 months.  Note, his blood pressure is elevated in the office today, either on repeat manual check, blood pressure remains elevated at 168/90.  However patient says his blood  pressure is typically high in office and runs a lot lower at home, he mentions his home blood pressure usually runs in the 120s.  I decided to hold off on adjusting his blood pressure medication today.   Past Medical History:  Diagnosis Date  . Aortic aneurysm (HCC)    DR BARTLE    JUST WATCHING   . Arthritis    spine  . Bowel obstruction ([place])   . GERD (gastroesophageal reflux disease)   . Headache    hx of 2 migraines as a college roommates  . High cholesterol   . Hypertension   . Melanoma ([place])    also basal cell carcinomas  . Meniere's disease of left ear   . Radiation 07/03/2015-08/16/2015   Left Mid Chest, surgical clips 66 gray  . Scoliosis deformity of spine   . Shingles    04/2014     RIGHT EYE, SCALP  . Shingles    right eye, forehead and head  . Shortness of breath dyspnea    WITH EXERTION   . Sleep related leg cramps   . Syncopal episodes    x 1 in October, 2015.    Past Surgical History:  Procedure Laterality Date  . APPENDECTOMY    . CATARACT EXTRACTION W/ INTRAOCULAR LENS  IMPLANT, BILATERAL    . COLON SURGERY     6 YRS AGO  "SNIPPED BAND BINDING INTESTINE"  .  COLONOSCOPY    . HERNIA REPAIR    . MELANOMA EXCISION    . pilonadal cyst removed    . surgery for bowel obstruction    . THORACOTOMY/LOBECTOMY Left 05/21/2015   Procedure: LEFT THORACOTOMY/LEFT UPPER LOBECTOMY;  Surgeon: Gaye Pollack, MD;  Location: [facility] OR;  Service: Thoracic;  Laterality: Left;  . TONSILLECTOMY    . VASECTOMY    . VIDEO BRONCHOSCOPY WITH ENDOBRONCHIAL NAVIGATION N/A 10/18/2014   Procedure: VIDEO BRONCHOSCOPY WITH ENDOBRONCHIAL NAVIGATION;  Surgeon: Collene Gobble, MD;  Location: [facility] OR;  Service: Thoracic;  Laterality: N/A;    Current Medications: Current Meds  Medication Sig  . Calcium Carb-Cholecalciferol (CALCIUM 600 + D PO) Take 600 mg by mouth daily.  . carvedilol (COREG) 6.25 MG tablet TAKE 1 TABLET BY MOUTH TWICE DAILY.  . famotidine (PEPCID) 20 MG tablet Take 1  tablet (20 mg total) by mouth at bedtime.  . hydrochlorothiazide (MICROZIDE) 12.5 MG capsule TAKE (1) CAPSULE DAILY.  . Multiple Vitamin (MULTIVITAMIN WITH MINERALS) TABS tablet Take 2 tablets by mouth daily.  . potassium chloride SA (K-DUR,KLOR-CON) 20 MEQ tablet 20 mEq 2 (two) times daily.   . rosuvastatin (CRESTOR) 10 MG tablet Take 5 mg by mouth daily. In AM  . telmisartan (MICARDIS) 80 MG tablet TAKE 1 TABLET ONCE DAILY.   Current Facility-Administered Medications for the 05/23/20 encounter (Office Visit) with Almyra Deforest, PA  Medication  . 0.9 %  sodium chloride infusion     Allergies:   Patient has no known allergies.   Social History   Socioeconomic History  . Marital status: Married    Spouse name: Not on file  . Number of children: Not on file  . Years of education: Not on file  . Highest education level: Not on file  Occupational History  . Occupation: executive  Tobacco Use  . Smoking status: Former Smoker    Packs/day: 2.00    Years: 25.00    Pack years: 50.00    Types: Cigarettes    Quit date: 10/21/1981    Years since quitting: 38.6  . Smokeless tobacco: Never Used  Substance and Sexual Activity  . Alcohol use: Yes    Alcohol/week: 20.0 standard drinks    Types: 20 Standard drinks or equivalent per week    Comment: occ  . Drug use: No  . Sexual activity: Not on file  Other Topics Concern  . Not on file  Social History Narrative  . Not on file   Social Determinants of Health   Financial Resource Strain:   . Difficulty of Paying Living Expenses: Not on file  Food Insecurity:   . Worried About Charity fundraiser in the Last Year: Not on file  . Ran Out of Food in the Last Year: Not on file  Transportation Needs:   . Lack of Transportation (Medical): Not on file  . Lack of Transportation (Non-Medical): Not on file  Physical Activity:   . Days of Exercise per Week: Not on file  . Minutes of Exercise per Session: Not on file  Stress:   . Feeling of  Stress : Not on file  Social Connections:   . Frequency of Communication with Friends and Family: Not on file  . Frequency of Social Gatherings with Friends and Family: Not on file  . Attends Religious Services: Not on file  . Active Member of Clubs or Organizations: Not on file  . Attends Archivist Meetings: Not on file  .  Marital Status: Not on file     Family History: The patient's family history includes Breast cancer in his mother; Prostate cancer in his father.  ROS:   Please see the history of present illness.     All other systems reviewed and are negative.  EKGs/Labs/Other Studies Reviewed:    The following studies were reviewed today:  Myoview 05/16/2015  The left ventricular ejection fraction is normal (55-65%).  Nuclear stress EF: 63%.  Blood pressure demonstrated a hypertensive response to exercise.  Upsloping ST segment depression ST segment depression of 1 mm was noted during stress in the III, II and aVF leads.  This is a low risk study.   Normal perfusion. LVEF 63% with normal wall motion. Fair exercise tolerance without chest pain. Hypertensive response to exercise. This is a low risk study.   EKG:  EKG is ordered today.  The ekg ordered today demonstrates normal sinus rhythm without significant ST-T wave changes  Recent Labs: No results found for requested labs within last 8760 hours.  Recent Lipid Panel No results found for: CHOL, TRIG, HDL, CHOLHDL, VLDL, LDLCALC, LDLDIRECT   Risk Assessment/Calculations:       Physical Exam:    VS:  BP (!) 168/88   Pulse 61   Ht 5' 7.5" (1.715 m)   Wt 179 lb (81.2 kg)   SpO2 99%   BMI 27.62 kg/m     Wt Readings from Last 3 Encounters:  05/23/20 179 lb (81.2 kg)  02/16/20 181 lb (82.1 kg)  01/25/20 180 lb (81.6 kg)     GEN:  Well nourished, well developed in no acute distress HEENT: Normal NECK: No JVD; No carotid bruits LYMPHATICS: No lymphadenopathy CARDIAC: RRR, no murmurs, rubs,  gallops RESPIRATORY:  Clear to auscultation without rales, wheezing or rhonchi  ABDOMEN: Soft, non-tender, non-distended MUSCULOSKELETAL:  No edema; No deformity  SKIN: Warm and dry NEUROLOGIC:  Alert and oriented x 3 PSYCHIATRIC:  Normal affect   ASSESSMENT:    1. History of syncope   2. Essential hypertension   3. Pure hypercholesterolemia    PLAN:    In order of problems listed above:  1. History of syncope: No recent recurrence.  Given prior history of orthostatic dizziness, he is quite careful with change in the body positions.  2. Hypertension: Blood pressure elevated in the office today, however blood pressure is normally quite well controlled at home  3. Hyperlipidemia: On Crestor.    Shared Decision Making/Informed Consent        Medication Adjustments/Labs and Tests Ordered: Current medicines are reviewed at length with the patient today.  Concerns regarding medicines are outlined above.  Orders Placed This Encounter  Procedures  . EKG 12-Lead   No orders of the defined types were placed in this encounter.   Patient Instructions  Medication Instructions:  Your physician recommends that you continue on your current medications as directed. Please refer to the Current Medication list given to you today.  *If you need a refill on your cardiac medications before your next appointment, please call your pharmacy*  Lab Work: NONE ordered at this time of appointment   If you have labs (blood work) drawn today and your tests are completely normal, you will receive your results only by: Marland Kitchen MyChart Message (if you have MyChart) OR . A paper copy in the mail If you have any lab test that is abnormal or we need to change your treatment, we will call you to review the results.  Testing/Procedures: NONE ordered at this time of appointment   Follow-Up: At [facility], you and your health needs are our priority.  As part of our continuing mission to provide you  with exceptional heart care, we have created designated Provider Care Teams.  These Care Teams include your primary Cardiologist (physician) and Advanced Practice Providers (APPs -  Physician Assistants and Nurse Practitioners) who all work together to provide you with the care you need, when you need it.  Your next appointment:   6 month(s)  The format for your next appointment:   In Person  Provider:   Sanda Klein, MD  Other Instructions     Signed, Almyra Deforest, [place]  05/25/2020 11:49 PM    [place]

## 2020-05-23 NOTE — Patient Instructions (Signed)
Medication Instructions:  Your physician recommends that you continue on your current medications as directed. Please refer to the Current Medication list given to you today.  *If you need a refill on your cardiac medications before your next appointment, please call your pharmacy*  Lab Work: NONE ordered at this time of appointment   If you have labs (blood work) drawn today and your tests are completely normal, you will receive your results only by: [place] MyChart Message (if you have MyChart) OR . A paper copy in the mail If you have any lab test that is abnormal or we need to change your treatment, we will call you to review the results.  Testing/Procedures: NONE ordered at this time of appointment   Follow-Up: At [facility], you and your health needs are our priority.  As part of our continuing mission to provide you with exceptional heart care, we have created designated Provider Care Teams.  These Care Teams include your primary Cardiologist (physician) and Advanced Practice Providers (APPs -  Physician Assistants and Nurse Practitioners) who all work together to provide you with the care you need, when you need it.  Your next appointment:   6 month(s)  The format for your next appointment:   In Person  Provider:   Sanda Klein, MD  Other Instructions

## 2020-05-25 ENCOUNTER — Encounter: Payer: Self-pay | Admitting: Physician Assistant

## 2020-06-05 DIAGNOSIS — L821 Other seborrheic keratosis: Secondary | ICD-10-CM | POA: Diagnosis not present

## 2020-06-05 DIAGNOSIS — L820 Inflamed seborrheic keratosis: Secondary | ICD-10-CM | POA: Diagnosis not present

## 2020-06-05 DIAGNOSIS — D1801 Hemangioma of skin and subcutaneous tissue: Secondary | ICD-10-CM | POA: Diagnosis not present

## 2020-06-05 DIAGNOSIS — Z85828 Personal history of other malignant neoplasm of skin: Secondary | ICD-10-CM | POA: Diagnosis not present

## 2020-06-05 DIAGNOSIS — D485 Neoplasm of uncertain behavior of skin: Secondary | ICD-10-CM | POA: Diagnosis not present

## 2020-06-05 DIAGNOSIS — C44619 Basal cell carcinoma of skin of left upper limb, including shoulder: Secondary | ICD-10-CM | POA: Diagnosis not present

## 2020-06-05 DIAGNOSIS — L570 Actinic keratosis: Secondary | ICD-10-CM | POA: Diagnosis not present

## 2020-06-05 DIAGNOSIS — C44729 Squamous cell carcinoma of skin of left lower limb, including hip: Secondary | ICD-10-CM | POA: Diagnosis not present

## 2020-06-05 DIAGNOSIS — Z85820 Personal history of malignant melanoma of skin: Secondary | ICD-10-CM | POA: Diagnosis not present

## 2020-06-13 ENCOUNTER — Other Ambulatory Visit: Payer: Self-pay

## 2020-06-13 ENCOUNTER — Ambulatory Visit
Admission: RE | Admit: 2020-06-13 | Discharge: 2020-06-13 | Disposition: A | Discharge status: Home / Self Care | Payer: PPO | Source: Ambulatory Visit | Attending: Surgery | Admitting: Surgery

## 2020-06-13 ENCOUNTER — Other Ambulatory Visit: Payer: Self-pay | Admitting: Surgery

## 2020-06-13 ENCOUNTER — Encounter: Payer: Self-pay | Admitting: Surgery

## 2020-06-13 ENCOUNTER — Ambulatory Visit: Payer: PPO | Admitting: Surgery

## 2020-06-13 VITALS — BP 179/92 | HR 73 | Resp 20 | Ht 67.0 in | Wt 180.0 lb

## 2020-06-13 DIAGNOSIS — C3412 Malignant neoplasm of upper lobe, left bronchus or lung: Secondary | ICD-10-CM

## 2020-06-13 DIAGNOSIS — R918 Other nonspecific abnormal finding of lung field: Secondary | ICD-10-CM | POA: Diagnosis not present

## 2020-06-13 DIAGNOSIS — Z85118 Personal history of other malignant neoplasm of bronchus and lung: Secondary | ICD-10-CM

## 2020-06-13 DIAGNOSIS — Z08 Encounter for follow-up examination after completed treatment for malignant neoplasm: Secondary | ICD-10-CM

## 2020-06-13 NOTE — Progress Notes (Signed)
HPI:  The patient returns for follow up s/p left upper lobectomy for a pT1b, pN0 stage IA well differentiated adenocarcinoma on 05/21/2015. Hedid not have afissure between the upper and lower lobes and the stapled resection margin was positive. He was not a candidate for a completion pneumonectomy and received XRT to 66 gray in 60 fractionsby Dr. Alcide Goodness on 08/16/2015. Since I saw him 1 year ago he has been feeling well he has some chronic exertional shortness of breath related to prior smoking, lung resection, and the effects of XRT.  He saw Dr. Melvyn Novas for this and no further intervention was warranted.  He has had some hoarseness and had left vocal cord weakness diagnosed by Dr. Benjamine Mola.  This started about 2 years after his surgery and radiation.  It improved with vocal exercises.  He said that he stopped doing the exercises and it seemed to worsen.  He is now doing them again.  He had a CT of the neck on 04/25/2020 which did not show any significant abnormality.  It was felt that most likely his vocal cord symptoms were related to the effects of radiation therapy.  Current Outpatient Medications  Medication Sig Dispense Refill  . Calcium Carb-Cholecalciferol (CALCIUM 600 + D PO) Take 600 mg by mouth daily.    . carvedilol (COREG) 6.25 MG tablet TAKE 1 TABLET BY MOUTH TWICE DAILY. 180 tablet 3  . famotidine (PEPCID) 20 MG tablet Take 1 tablet (20 mg total) by mouth at bedtime. 30 tablet 11  . hydrochlorothiazide (MICROZIDE) 12.5 MG capsule TAKE (1) CAPSULE DAILY. 90 capsule 0  . Multiple Vitamin (MULTIVITAMIN WITH MINERALS) TABS tablet Take 2 tablets by mouth daily.    . potassium chloride SA (K-DUR,KLOR-CON) 20 MEQ tablet 20 mEq 2 (two) times daily.     . rosuvastatin (CRESTOR) 10 MG tablet Take 5 mg by mouth daily. In AM    . telmisartan (MICARDIS) 80 MG tablet TAKE 1 TABLET ONCE DAILY. 90 tablet 2   Current Facility-Administered Medications  Medication Dose Route Frequency  Provider Last Rate Last Admin  . 0.9 %  sodium chloride infusion  500 mL Intravenous Continuous Armbruster, Carlota Raspberry, MD         Physical Exam: BP (!) 179/92 (BP Location: Right Arm, Patient Position: Sitting)   Pulse 73   Resp 20   Ht 5\' 7"  (1.702 m)   Wt 180 lb (81.6 kg)   SpO2 98% Comment: RA with mask on  BMI 28.19 kg/m  He looks well. There is no cervical or supraclavicular adenopathy. Lung exam is clear. Cardiac exam shows a regular rate and rhythm with normal heart sounds.  Diagnostic Tests:  Narrative & Impression  CLINICAL DATA:  Follow-up left lung cancer, status post thoracotomy  EXAM: CT CHEST WITHOUT CONTRAST  TECHNIQUE: Multidetector CT imaging of the chest was performed following the standard protocol without IV contrast.  COMPARISON:  06/08/2019, 11/24/2018  FINDINGS: Cardiovascular: Aortic atherosclerosis. Normal heart size. Three-vessel coronary artery calcifications. No pericardial effusion.  Mediastinum/Nodes: No enlarged mediastinal, hilar, or axillary lymph nodes. Thyroid gland, trachea, and esophagus demonstrate no significant findings.  Lungs/Pleura: Redemonstrated postoperative findings of left upper lobectomy with dense radiation fibrosis of the remaining apical left lower lobe (series 8, image 48). Scattered tiny centrilobular pulmonary nodules throughout the right upper lobe. No pleural effusion or pneumothorax.  Upper Abdomen: No acute abnormality.  Hepatic steatosis.  Musculoskeletal: No chest wall mass or suspicious bone lesions identified.  IMPRESSION: 1.  Unchanged post treatment appearance of the chest, with redemonstrated postoperative findings of left upper lobectomy and dense radiation fibrosis of the remaining apical left lower lobe. 2. Scattered tiny centrilobular pulmonary nodules throughout the right upper lobe, benign and likely smoking-related respiratory bronchiolitis. 3. Coronary artery disease. 4.  Hepatic steatosis.  Aortic Atherosclerosis (ICD10-I70.0).   Electronically Signed   By: Eddie Candle M.D.   On: 06/13/2020 13:43      Impression:  He is doing well 5 years following lung cancer resection followed by XRT with no evidence of recurrent or new malignancy on CT scan of the chest today.  I reviewed the CT images with him and answered his questions.  I recommend that he have a follow-up scan in 1 year.  He does have some hoarseness but its been going on for several years which I suspect is due to radiation effects on the left recurrent laryngeal nerve.  He will continue his voice exercises for this.  I do not see anything of concern on his CT scan to suggest malignant involvement of the recurrent laryngeal nerve.  Plan:  I will see him back in 1 year with a CT scan of the chest without contrast for surveillance of his lung cancer.  I spent 20 minutes performing this established patient evaluation and > 50% of this time was spent face to face counseling and coordinating the surveillance of his previously resected lung cancer.    Gaye Pollack, MD [facility] [phone]

## 2020-07-23 ENCOUNTER — Other Ambulatory Visit: Payer: Self-pay | Admitting: Cardiovascular Disease

## 2020-07-24 DIAGNOSIS — R490 Dysphonia: Secondary | ICD-10-CM | POA: Diagnosis not present

## 2020-07-24 DIAGNOSIS — H903 Sensorineural hearing loss, bilateral: Secondary | ICD-10-CM | POA: Diagnosis not present

## 2020-07-24 DIAGNOSIS — H8103 Meniere's disease, bilateral: Secondary | ICD-10-CM | POA: Diagnosis not present

## 2020-07-24 DIAGNOSIS — J3801 Paralysis of vocal cords and larynx, unilateral: Secondary | ICD-10-CM | POA: Diagnosis not present

## 2020-09-14 DIAGNOSIS — Z961 Presence of intraocular lens: Secondary | ICD-10-CM | POA: Diagnosis not present

## 2020-09-14 DIAGNOSIS — H52203 Unspecified astigmatism, bilateral: Secondary | ICD-10-CM | POA: Diagnosis not present

## 2020-09-14 DIAGNOSIS — H35371 Puckering of macula, right eye: Secondary | ICD-10-CM | POA: Diagnosis not present

## 2020-10-01 DIAGNOSIS — J3801 Paralysis of vocal cords and larynx, unilateral: Secondary | ICD-10-CM | POA: Diagnosis not present

## 2020-10-01 DIAGNOSIS — J383 Other diseases of vocal cords: Secondary | ICD-10-CM | POA: Diagnosis not present

## 2020-10-01 DIAGNOSIS — J384 Edema of larynx: Secondary | ICD-10-CM | POA: Diagnosis not present

## 2020-10-01 DIAGNOSIS — Z87891 Personal history of nicotine dependence: Secondary | ICD-10-CM | POA: Diagnosis not present

## 2020-10-02 DIAGNOSIS — J383 Other diseases of vocal cords: Secondary | ICD-10-CM | POA: Diagnosis not present

## 2020-10-02 DIAGNOSIS — J3801 Paralysis of vocal cords and larynx, unilateral: Secondary | ICD-10-CM | POA: Diagnosis not present

## 2020-10-02 DIAGNOSIS — R490 Dysphonia: Secondary | ICD-10-CM | POA: Diagnosis not present

## 2020-10-30 DIAGNOSIS — J383 Other diseases of vocal cords: Secondary | ICD-10-CM | POA: Diagnosis not present

## 2020-10-30 DIAGNOSIS — J3801 Paralysis of vocal cords and larynx, unilateral: Secondary | ICD-10-CM | POA: Diagnosis not present

## 2020-10-30 DIAGNOSIS — R490 Dysphonia: Secondary | ICD-10-CM | POA: Diagnosis not present

## 2020-11-01 ENCOUNTER — Other Ambulatory Visit: Payer: Self-pay | Admitting: Cardiovascular Disease

## 2020-11-06 DIAGNOSIS — R490 Dysphonia: Secondary | ICD-10-CM | POA: Diagnosis not present

## 2020-11-13 DIAGNOSIS — J383 Other diseases of vocal cords: Secondary | ICD-10-CM | POA: Diagnosis not present

## 2020-11-13 DIAGNOSIS — R490 Dysphonia: Secondary | ICD-10-CM | POA: Diagnosis not present

## 2020-11-13 DIAGNOSIS — J3801 Paralysis of vocal cords and larynx, unilateral: Secondary | ICD-10-CM | POA: Diagnosis not present

## 2020-12-05 DIAGNOSIS — L570 Actinic keratosis: Secondary | ICD-10-CM | POA: Diagnosis not present

## 2020-12-05 DIAGNOSIS — Z85828 Personal history of other malignant neoplasm of skin: Secondary | ICD-10-CM | POA: Diagnosis not present

## 2020-12-05 DIAGNOSIS — Z85820 Personal history of malignant melanoma of skin: Secondary | ICD-10-CM | POA: Diagnosis not present

## 2020-12-05 DIAGNOSIS — D485 Neoplasm of uncertain behavior of skin: Secondary | ICD-10-CM | POA: Diagnosis not present

## 2020-12-05 DIAGNOSIS — D1801 Hemangioma of skin and subcutaneous tissue: Secondary | ICD-10-CM | POA: Diagnosis not present

## 2020-12-05 DIAGNOSIS — L820 Inflamed seborrheic keratosis: Secondary | ICD-10-CM | POA: Diagnosis not present

## 2020-12-05 DIAGNOSIS — L821 Other seborrheic keratosis: Secondary | ICD-10-CM | POA: Diagnosis not present

## 2020-12-05 DIAGNOSIS — C44319 Basal cell carcinoma of skin of other parts of face: Secondary | ICD-10-CM | POA: Diagnosis not present

## 2020-12-09 ENCOUNTER — Other Ambulatory Visit: Payer: Self-pay | Admitting: Cardiovascular Disease

## 2020-12-18 DIAGNOSIS — J383 Other diseases of vocal cords: Secondary | ICD-10-CM | POA: Diagnosis not present

## 2020-12-18 DIAGNOSIS — J3801 Paralysis of vocal cords and larynx, unilateral: Secondary | ICD-10-CM | POA: Diagnosis not present

## 2020-12-18 DIAGNOSIS — R490 Dysphonia: Secondary | ICD-10-CM | POA: Diagnosis not present

## 2021-01-01 ENCOUNTER — Other Ambulatory Visit: Payer: Self-pay | Admitting: Cardiovascular Disease

## 2021-01-01 ENCOUNTER — Telehealth: Payer: Self-pay

## 2021-01-01 DIAGNOSIS — C44319 Basal cell carcinoma of skin of other parts of face: Secondary | ICD-10-CM | POA: Diagnosis not present

## 2021-01-01 DIAGNOSIS — J387 Other diseases of larynx: Secondary | ICD-10-CM | POA: Diagnosis not present

## 2021-01-01 DIAGNOSIS — J383 Other diseases of vocal cords: Secondary | ICD-10-CM | POA: Diagnosis not present

## 2021-01-01 DIAGNOSIS — Z85828 Personal history of other malignant neoplasm of skin: Secondary | ICD-10-CM | POA: Diagnosis not present

## 2021-01-01 DIAGNOSIS — J384 Edema of larynx: Secondary | ICD-10-CM | POA: Diagnosis not present

## 2021-01-01 DIAGNOSIS — J3801 Paralysis of vocal cords and larynx, unilateral: Secondary | ICD-10-CM | POA: Diagnosis not present

## 2021-01-01 MED ORDER — CARVEDILOL 6.25 MG PO TABS
6.2500 mg | ORAL_TABLET | Freq: Two times a day (BID) | ORAL | 0 refills | Status: DC
Start: 2021-01-01 — End: 2021-04-03

## 2021-01-01 NOTE — Telephone Encounter (Signed)
Received call from patient requesting a refill on his carvedilol.   I reviewed patients chart and informed him that he needed a six month follow up appointment scheduled for additional refills. Patient stated he has never been told that he must be seen routinely for additional refills. I advised patient that he saw Dr Lurline Del PA Isaac Laud back in December 2021, and he recommend he follow up with Dr C in six months which would be July. I told the patient that he may have received a letter in the mail reminding him to contact the office for an appointment. He states he may have received a letter but overlooked what the letter was saying. He states he will be traveling since his plans were stopped due to covid.  I advised patient that I could send a message to Dr Lurline Del nurse and she could give him a call back. He declined and stated he did not mind making an appointment.  Follow up appointment has been made with Dr C in November.  Rx(s) sent to pharmacy electronically.  Patient voiced understanding.

## 2021-01-08 DIAGNOSIS — Z4802 Encounter for removal of sutures: Secondary | ICD-10-CM | POA: Diagnosis not present

## 2021-01-31 ENCOUNTER — Other Ambulatory Visit: Payer: Self-pay | Admitting: Cardiovascular Disease

## 2021-02-12 DIAGNOSIS — H8103 Meniere's disease, bilateral: Secondary | ICD-10-CM | POA: Diagnosis not present

## 2021-02-12 DIAGNOSIS — R490 Dysphonia: Secondary | ICD-10-CM | POA: Diagnosis not present

## 2021-02-12 DIAGNOSIS — H903 Sensorineural hearing loss, bilateral: Secondary | ICD-10-CM | POA: Diagnosis not present

## 2021-04-03 ENCOUNTER — Other Ambulatory Visit: Payer: Self-pay | Admitting: Cardiovascular Disease

## 2021-05-03 ENCOUNTER — Other Ambulatory Visit: Payer: Self-pay | Admitting: *Deleted

## 2021-05-03 DIAGNOSIS — Z85118 Personal history of other malignant neoplasm of bronchus and lung: Secondary | ICD-10-CM

## 2021-05-08 ENCOUNTER — Ambulatory Visit: Payer: PPO | Admitting: Cardiovascular Disease

## 2021-05-08 ENCOUNTER — Other Ambulatory Visit: Payer: Self-pay

## 2021-05-08 ENCOUNTER — Encounter: Payer: Self-pay | Admitting: Cardiovascular Disease

## 2021-05-08 VITALS — BP 138/80 | HR 65 | Ht 67.0 in | Wt 179.0 lb

## 2021-05-08 DIAGNOSIS — J3800 Paralysis of vocal cords and larynx, unspecified: Secondary | ICD-10-CM

## 2021-05-08 DIAGNOSIS — I10 Essential (primary) hypertension: Secondary | ICD-10-CM

## 2021-05-08 DIAGNOSIS — I700 Atherosclerosis of aorta: Secondary | ICD-10-CM | POA: Diagnosis not present

## 2021-05-08 DIAGNOSIS — C3412 Malignant neoplasm of upper lobe, left bronchus or lung: Secondary | ICD-10-CM | POA: Diagnosis not present

## 2021-05-08 DIAGNOSIS — E7800 Pure hypercholesterolemia, unspecified: Secondary | ICD-10-CM

## 2021-05-08 DIAGNOSIS — I77810 Thoracic aortic ectasia: Secondary | ICD-10-CM | POA: Diagnosis not present

## 2021-05-08 DIAGNOSIS — Z87898 Personal history of other specified conditions: Secondary | ICD-10-CM

## 2021-05-08 DIAGNOSIS — J449 Chronic obstructive pulmonary disease, unspecified: Secondary | ICD-10-CM

## 2021-05-08 NOTE — Patient Instructions (Signed)

## 2021-05-08 NOTE — Progress Notes (Signed)
Cardiology Office Note:    Date:  05/08/2021   ID:  [name]., DOB 1940-11-02, MRN [id number]  PCP:  Leanna Battles, MD   [facility] Providers Cardiologist:  Sanda Klein, MD     Referring MD: Leanna Battles, MD   Chief Complaint  Patient presents with   Follow-up    6 months.    History of Present Illness:    George Montoya. is a 80 y.o. male with a hx of syncope with a pattern suggestive of orthostatic hypotension/vasovagal syncope, Mnire's disease, essential hypertension, hypercholesterolemia, aortic atherosclerosis, borderline dilation of the thoracic aorta, lung cancer status postsurgery and radiation therapy.  He has not had any syncopal events since his last appointment.  He also denies chest pain or shortness of breath.  He has a change in his voice due to injury to the left recurrent laryngeal nerve following surgery and radiation therapy, but does not have any difficulty with swallowing choking or coughing.  He denies angina at rest with activity.  He does not have orthopnea, PND, lower extremity edema or palpitations.    His vertigo has improved as he is lost hearing in his left ear from Mnire's disease.  He does feel that his balance is not as steady.  He is well aware of his risk of orthostatic hypotension and always counts to 15 in a sitting position before getting up from the bed.  He has not had any falls.  His blood pressure at home is typically in the 130/80 range.  The radiologist did not comment on any dilation of the ascending aorta on his most recent study from 06/13/2020 (noncontrast study).  The maximum diameter that I can measure is 4.2 cm, which has been unchanged over the years.  He has now had 3 syncopal events.  In 2015 at work he had an episode of syncope that was probably associated with hypotension due to an acute diarrheal illness.  It was associated with some prodromal symptoms.  However he has had syncope twice this year in  March and in April.  The episodes were similar.  They both occurred when he jumped out of bed to use the restroom at around 2 or 3:00 in the morning.  He does not recall dizziness, lightheadedness, diaphoresis, nausea, flushing or any of the typical prodromal symptoms of a vasovagal event.  On both occasions he had head impact, had bleeding lacerations and required stitches, thankfully no intracranial bleeding.  Neither episode was associated with tonic-clonic movements, incontinence or postictal state.  The first time he was in his daughter's home and thinks he was simply unfamiliar with the surroundings and hit an obstacle.  The second syncopal event however happened in his home.  Echocardiogram in 2015 was essentially a normal study.  He had a normal nuclear stress test in 2016.   He wore an event monitor in June 2019 with completely normal results.  He has mild ectasia of the ascending thoracic aorta measured at 4 cm by CT, where he also had evidence of coronary and aortic atherosclerosis.  He has follow-up chest CT scans every 6-12 months and his aorta has not been described as dilated on the most recent studies.  He has previous undergone a left upper lobectomy for a stage I well-differentiated adenocarcinoma of the lung in 2016, followed by XRT. He has chronic XRT related changes in the left lower lobe.   Past Medical History:  Diagnosis Date   Aortic aneurysm ([place])  DR Cassandria Santee WATCHING    Arthritis    spine   Bowel obstruction (HCC)    GERD (gastroesophageal reflux disease)    Headache    hx of 2 migraines as a college roommates   High cholesterol    Hypertension    Melanoma ([place])    also basal cell carcinomas   Meniere's disease of left ear    Radiation 07/03/2015-08/16/2015   Left Mid Chest, surgical clips 66 gray   Scoliosis deformity of spine    Shingles    04/2014     RIGHT EYE, SCALP   Shingles    right eye, forehead and head   Shortness of breath dyspnea    WITH  EXERTION    Sleep related leg cramps    Syncopal episodes    x 1 in October, 2015.    Past Surgical History:  Procedure Laterality Date   APPENDECTOMY     CATARACT EXTRACTION W/ INTRAOCULAR LENS  IMPLANT, BILATERAL     COLON SURGERY     6 YRS AGO  "SNIPPED BAND BINDING INTESTINE"   COLONOSCOPY     HERNIA REPAIR     MELANOMA EXCISION     pilonadal cyst removed     surgery for bowel obstruction     THORACOTOMY/LOBECTOMY Left 05/21/2015   Procedure: LEFT THORACOTOMY/LEFT UPPER LOBECTOMY;  Surgeon: Gaye Pollack, MD;  Location: [facility];  Service: Thoracic;  Laterality: Left;   TONSILLECTOMY     VASECTOMY     VIDEO BRONCHOSCOPY WITH ENDOBRONCHIAL NAVIGATION N/A 10/18/2014   Procedure: VIDEO BRONCHOSCOPY WITH ENDOBRONCHIAL NAVIGATION;  Surgeon: Collene Gobble, MD;  Location: [facility];  Service: Thoracic;  Laterality: N/A;    Current Medications: Current Meds  Medication Sig   Calcium Carb-Cholecalciferol (CALCIUM 600 + D PO) Take 600 mg by mouth daily.   carvedilol (COREG) 6.25 MG tablet Take 1 tablet (6.25 mg total) by mouth 2 (two) times daily.   famotidine (PEPCID) 20 MG tablet Take 1 tablet (20 mg total) by mouth at bedtime.   hydrochlorothiazide (MICROZIDE) 12.5 MG capsule TAKE ONE CAPSULE BY MOUTH DAILY   Multiple Vitamin (MULTIVITAMIN WITH MINERALS) TABS tablet Take 2 tablets by mouth daily.   potassium chloride SA (K-DUR,KLOR-CON) 20 MEQ tablet 20 mEq 2 (two) times daily.    rosuvastatin (CRESTOR) 10 MG tablet Take 5 mg by mouth daily. In AM   telmisartan (MICARDIS) 80 MG tablet TAKE 1 TABLET ONCE DAILY.   Current Facility-Administered Medications for the 05/08/21 encounter (Office Visit) with Dontez Hauss, Dani Gobble, MD  Medication   0.9 %  sodium chloride infusion     Allergies:   Patient has no known allergies.   Social History   Socioeconomic History   Marital status: Married    Spouse name: Not on file   Number of children: Not on file   Years of education: Not on file    Highest education level: Not on file  Occupational History   Occupation: executive  Tobacco Use   Smoking status: Former    Packs/day: 2.00    Years: 25.00    Pack years: 50.00    Types: Cigarettes    Quit date: 10/21/1981    Years since quitting: 39.5   Smokeless tobacco: Never  Substance and Sexual Activity   Alcohol use: Yes    Alcohol/week: 20.0 standard drinks    Types: 20 Standard drinks or equivalent per week    Comment: occ   Drug use: No  Sexual activity: Not on file  Other Topics Concern   Not on file  Social History Narrative   Not on file   Social Determinants of Health   Financial Resource Strain: Not on file  Food Insecurity: Not on file  Transportation Needs: Not on file  Physical Activity: Not on file  Stress: Not on file  Social Connections: Not on file     Family History: The patient's family history includes Breast cancer in his mother; Prostate cancer in his father.  ROS:   Please see the history of present illness.     All other systems reviewed and are negative.  EKGs/Labs/Other Studies Reviewed:    The following studies were reviewed today: Chest CT 06/13/2020  EKG:  EKG is  ordered today.  The ekg ordered today demonstrates normal sinus rhythm, normal ECG  Recent Labs: No results found for requested labs within last 8760 hours.  Recent Lipid Panel No results found for: CHOL, TRIG, HDL, CHOLHDL, VLDL, LDLCALC, LDLDIRECT 04/24/2020 Cholesterol 170, HDL 91, LDL 59, triglycerides 99  Risk Assessment/Calculations:           Physical Exam:    VS:  BP 138/80 (BP Location: Left Arm, Patient Position: Sitting, Cuff Size: Normal)   Pulse 65   Ht 5\' 7"  (1.702 m)   Wt 179 lb (81.2 kg)   BMI 28.04 kg/m     Wt Readings from Last 3 Encounters:  05/08/21 179 lb (81.2 kg)  06/13/20 180 lb (81.6 kg)  05/23/20 179 lb (81.2 kg)     GEN: Well nourished, well developed in no acute distress HEENT: Normal.  Slightly bitonal voice NECK: No  JVD; No carotid bruits LYMPHATICS: No lymphadenopathy CARDIAC: RRR, no murmurs, rubs, gallops RESPIRATORY:  Clear to auscultation without rales, wheezing or rhonchi  ABDOMEN: Soft, non-tender, non-distended MUSCULOSKELETAL:  No edema; No deformity  SKIN: Warm and dry NEUROLOGIC:  Alert and oriented x 3 PSYCHIATRIC:  Normal affect   ASSESSMENT:    1. History of syncope   2. Essential hypertension   3. Atherosclerosis of aorta ([place])   4. Ascending aorta dilatation (HCC)   5. Pure hypercholesterolemia   6. COPD GOLD 0   7. Primary cancer of left upper lobe of lung ([place])   8. Recurrent laryngeal nerve palsy    PLAN:    In order of problems listed above:  Syncope: Very infrequent, pending suggestive of orthostatic hypotension and/or vasovagal syncope.  Avoid "perfect" blood pressure control.  Target 140/90.  Avoid diuretics.  Change position gradually, as he is doing. HTN: Well-controlled. Aortic/coronary atherosclerosis: Denies angina pectoris.  Low risk nuclear stress test in 2016.  Focus on risk factor modification. Mild dilation of the ascending aorta: On my measurement of study from 06/13/2020 the ascending aorta measures a maximum of 4.2 cm in diameter (noncontrast study which probably exaggerates the measurement).  This is not changed from previous evaluations.  He will have periodic CTs to monitor his lung cancer progress and we will be able to see the aorta well on those tests. HLP: On statin.  All lipid parameters well within target range. COPD/lung cancer/left recurrent laryngeal nerve palsy: No evidence of recurrent neoplasm status post surgery and XRT.  Does not have a lot of problems with shortness of breath.  Tolerates carvedilol without worsening wheezing.  Quit smoking over 20 years ago.           Medication Adjustments/Labs and Tests Ordered: Current medicines are reviewed at length with the  patient today.  Concerns regarding medicines are outlined above.  Orders  Placed This Encounter  Procedures   EKG 12-Lead   No orders of the defined types were placed in this encounter.   Patient Instructions  Medication Instructions:  No changes *If you need a refill on your cardiac medications before your next appointment, please call your pharmacy*   Lab Work: None ordered If you have labs (blood work) drawn today and your tests are completely normal, you will receive your results only by: [place] (if you have MyChart) OR A paper copy in the mail If you have any lab test that is abnormal or we need to change your treatment, we will call you to review the results.   Testing/Procedures: None ordered   Follow-Up: At [facility], you and your health needs are our priority.  As part of our continuing mission to provide you with exceptional heart care, we have created designated Provider Care Teams.  These Care Teams include your primary Cardiologist (physician) and Advanced Practice Providers (APPs -  Physician Assistants and Nurse Practitioners) who all work together to provide you with the care you need, when you need it.  We recommend signing up for the patient portal called "MyChart".  Sign up information is provided on this After Visit Summary.  MyChart is used to connect with patients for Virtual Visits (Telemedicine).  Patients are able to view lab/test results, encounter notes, upcoming appointments, etc.  Non-urgent messages can be sent to your provider as well.   To learn more about what you can do with MyChart, go to [email or web address].    Your next appointment:   12 month(s)  The format for your next appointment:   In Person  Provider:   Sanda Klein, MD       Signed, Sanda Klein, MD  05/08/2021 11:54 AM    [place]

## 2021-05-23 DIAGNOSIS — E785 Hyperlipidemia, unspecified: Secondary | ICD-10-CM | POA: Diagnosis not present

## 2021-05-23 DIAGNOSIS — Z125 Encounter for screening for malignant neoplasm of prostate: Secondary | ICD-10-CM | POA: Diagnosis not present

## 2021-05-23 DIAGNOSIS — I10 Essential (primary) hypertension: Secondary | ICD-10-CM | POA: Diagnosis not present

## 2021-05-24 ENCOUNTER — Other Ambulatory Visit: Payer: Self-pay | Admitting: Cardiovascular Disease

## 2021-05-30 DIAGNOSIS — Z0000 Encounter for general adult medical examination without abnormal findings: Secondary | ICD-10-CM | POA: Diagnosis not present

## 2021-05-30 DIAGNOSIS — R490 Dysphonia: Secondary | ICD-10-CM | POA: Diagnosis not present

## 2021-05-30 DIAGNOSIS — E785 Hyperlipidemia, unspecified: Secondary | ICD-10-CM | POA: Diagnosis not present

## 2021-05-30 DIAGNOSIS — I2510 Atherosclerotic heart disease of native coronary artery without angina pectoris: Secondary | ICD-10-CM | POA: Diagnosis not present

## 2021-05-30 DIAGNOSIS — R82998 Other abnormal findings in urine: Secondary | ICD-10-CM | POA: Diagnosis not present

## 2021-06-06 DIAGNOSIS — L821 Other seborrheic keratosis: Secondary | ICD-10-CM | POA: Diagnosis not present

## 2021-06-06 DIAGNOSIS — Z85820 Personal history of malignant melanoma of skin: Secondary | ICD-10-CM | POA: Diagnosis not present

## 2021-06-06 DIAGNOSIS — L570 Actinic keratosis: Secondary | ICD-10-CM | POA: Diagnosis not present

## 2021-06-06 DIAGNOSIS — Z85828 Personal history of other malignant neoplasm of skin: Secondary | ICD-10-CM | POA: Diagnosis not present

## 2021-06-19 ENCOUNTER — Ambulatory Visit
Admission: RE | Admit: 2021-06-19 | Discharge: 2021-06-19 | Disposition: A | Discharge status: Home / Self Care | Payer: PPO | Source: Ambulatory Visit | Attending: Surgery | Admitting: Surgery

## 2021-06-19 ENCOUNTER — Ambulatory Visit: Payer: PPO | Admitting: Physician Assistant

## 2021-06-19 ENCOUNTER — Other Ambulatory Visit: Payer: Self-pay

## 2021-06-19 VITALS — BP 128/74 | HR 64 | Resp 20 | Ht 67.0 in | Wt 185.0 lb

## 2021-06-19 DIAGNOSIS — Z85118 Personal history of other malignant neoplasm of bronchus and lung: Secondary | ICD-10-CM

## 2021-06-19 DIAGNOSIS — C3490 Malignant neoplasm of unspecified part of unspecified bronchus or lung: Secondary | ICD-10-CM | POA: Diagnosis not present

## 2021-06-19 NOTE — Progress Notes (Signed)
[address]       , [id number]             [phone]       HPI:  Mr. Pickerill returns for follow up s/p left upper lobectomy for a pT1b, pN0 stage IA well differentiated adenocarcinoma on 05/21/2015. He did not have a fissure between the upper and lower lobes and the stapled resection margin was positive. He was not a candidate for a completion pneumonectomy and received XRT to 66 gray in 33 fractions by Dr. Sondra Come completed on 08/16/2015.  Since completion of radiation the patient has had some issues with hoarseness for which he has been evaluated by Dr. Benjamine Mola.  Overall this has improved with voice exercises.  He presents today for 1 year follow up with repeat CT scan.  The patient overall is doing very well.  He has had no further issues with hoarseness.  He denies chest pain and shortness of breath.  Current Outpatient Medications  Medication Sig Dispense Refill   Calcium Carb-Cholecalciferol (CALCIUM 600 + D PO) Take 600 mg by mouth daily.     carvedilol (COREG) 6.25 MG tablet Take 1 tablet (6.25 mg total) by mouth 2 (two) times daily. 60 tablet 0   famotidine (PEPCID) 20 MG tablet Take 1 tablet (20 mg total) by mouth at bedtime. 30 tablet 11   hydrochlorothiazide (MICROZIDE) 12.5 MG capsule TAKE ONE CAPSULE BY MOUTH DAILY 90 capsule 0   Multiple Vitamin (MULTIVITAMIN WITH MINERALS) TABS tablet Take 2 tablets by mouth daily.     potassium chloride SA (K-DUR,KLOR-CON) 20 MEQ tablet 20 mEq 2 (two) times daily.      rosuvastatin (CRESTOR) 10 MG tablet Take 5 mg by mouth daily. In AM     telmisartan (MICARDIS) 80 MG tablet TAKE 1 TABLET ONCE DAILY. 90 tablet 3   Current Facility-Administered Medications  Medication Dose Route Frequency Provider Last Rate Last Admin   0.9 %  sodium chloride infusion  500 mL Intravenous Continuous Armbruster, Carlota Raspberry, MD        Physical Exam:  BP 128/74 (BP Location: Left Arm, Patient Position: Sitting)    Pulse 64    Resp 20     Ht 5\' 7"  (1.702 m)    Wt 185 lb (83.9 kg)    SpO2 99% Comment: Ra   BMI 28.98 kg/m   Gen: no apparent distress Heart: RRR Lungs: CTA bilaterally Abd: soft non-tender, non-distended Neuro: grossly intact  Diagnostic Tests:  CT Scan:  Lungs/Pleura: Prior left upper lobe lobectomy. Stable left paramediastinal and apical post radiation changes. Stable solid pulmonary nodule of the upper left lower lobe measuring 5 mm on series 8, image 46. No new or enlarging pulmonary nodules.  IMPRESSION: 1. Prior left upper lobectomy with stable post treatment changes. No evidence of recurrent or metastatic disease. 2. Aortic Atherosclerosis (ICD10-I70.0).    Electronically Signed   By: Yetta Glassman M.D.   On: 06/19/2021 11:48   A/P:  S/p left upper lobectomy for a pT1b, pN0 stage IA well differentiated adenocarcinoma on 05/21/2015. Followed by XRT to 66 gray in 33 fractions by Dr. Sondra Come completed on 08/16/2015.  He is now 6 years out from surgical resection and 5 years out from completing radiation therapy.  His CT scan today shows stable appearance of a 5 mm Left lower lobe nodule which is stable.  There is no evidence of metastatic or recurrent malignancy.  We will plan  to see him back in 1 year with repeat CT chest    [name], PA-C [facility] [phone]

## 2021-06-30 ENCOUNTER — Other Ambulatory Visit: Payer: Self-pay | Admitting: Cardiovascular Disease

## 2021-08-14 DIAGNOSIS — H903 Sensorineural hearing loss, bilateral: Secondary | ICD-10-CM | POA: Diagnosis not present

## 2021-08-14 DIAGNOSIS — H8102 Meniere's disease, left ear: Secondary | ICD-10-CM | POA: Diagnosis not present

## 2021-08-30 DIAGNOSIS — R5383 Other fatigue: Secondary | ICD-10-CM | POA: Diagnosis not present

## 2021-08-30 DIAGNOSIS — J302 Other seasonal allergic rhinitis: Secondary | ICD-10-CM | POA: Diagnosis not present

## 2021-08-30 DIAGNOSIS — H8109 Meniere's disease, unspecified ear: Secondary | ICD-10-CM | POA: Diagnosis not present

## 2021-08-30 DIAGNOSIS — E871 Hypo-osmolality and hyponatremia: Secondary | ICD-10-CM | POA: Diagnosis not present

## 2021-08-30 DIAGNOSIS — I959 Hypotension, unspecified: Secondary | ICD-10-CM | POA: Diagnosis not present

## 2021-08-30 DIAGNOSIS — I2510 Atherosclerotic heart disease of native coronary artery without angina pectoris: Secondary | ICD-10-CM | POA: Diagnosis not present

## 2021-08-30 DIAGNOSIS — C3412 Malignant neoplasm of upper lobe, left bronchus or lung: Secondary | ICD-10-CM | POA: Diagnosis not present

## 2021-09-02 DIAGNOSIS — Z8616 Personal history of COVID-19: Secondary | ICD-10-CM | POA: Diagnosis not present

## 2021-09-02 DIAGNOSIS — C3412 Malignant neoplasm of upper lobe, left bronchus or lung: Secondary | ICD-10-CM | POA: Diagnosis not present

## 2021-09-02 DIAGNOSIS — E871 Hypo-osmolality and hyponatremia: Secondary | ICD-10-CM | POA: Diagnosis not present

## 2021-09-02 DIAGNOSIS — J302 Other seasonal allergic rhinitis: Secondary | ICD-10-CM | POA: Diagnosis not present

## 2021-09-02 DIAGNOSIS — R058 Other specified cough: Secondary | ICD-10-CM | POA: Diagnosis not present

## 2021-09-09 ENCOUNTER — Other Ambulatory Visit: Payer: Self-pay | Admitting: Cardiovascular Disease

## 2021-09-12 DIAGNOSIS — I10 Essential (primary) hypertension: Secondary | ICD-10-CM | POA: Diagnosis not present

## 2021-09-12 DIAGNOSIS — R5383 Other fatigue: Secondary | ICD-10-CM | POA: Diagnosis not present

## 2021-09-12 DIAGNOSIS — H8109 Meniere's disease, unspecified ear: Secondary | ICD-10-CM | POA: Diagnosis not present

## 2021-09-12 DIAGNOSIS — E871 Hypo-osmolality and hyponatremia: Secondary | ICD-10-CM | POA: Diagnosis not present

## 2021-09-18 ENCOUNTER — Ambulatory Visit: Payer: PPO | Admitting: Primary Care

## 2021-09-18 ENCOUNTER — Encounter: Payer: Self-pay | Admitting: Primary Care

## 2021-09-18 ENCOUNTER — Other Ambulatory Visit: Payer: Self-pay

## 2021-09-18 VITALS — BP 152/82 | HR 70 | Temp 98.4°F | Ht 67.25 in | Wt 179.0 lb

## 2021-09-18 DIAGNOSIS — K219 Gastro-esophageal reflux disease without esophagitis: Secondary | ICD-10-CM | POA: Diagnosis not present

## 2021-09-18 DIAGNOSIS — J449 Chronic obstructive pulmonary disease, unspecified: Secondary | ICD-10-CM

## 2021-09-18 DIAGNOSIS — Z961 Presence of intraocular lens: Secondary | ICD-10-CM | POA: Diagnosis not present

## 2021-09-18 DIAGNOSIS — H35371 Puckering of macula, right eye: Secondary | ICD-10-CM | POA: Diagnosis not present

## 2021-09-18 DIAGNOSIS — H52203 Unspecified astigmatism, bilateral: Secondary | ICD-10-CM | POA: Diagnosis not present

## 2021-09-18 NOTE — Patient Instructions (Addendum)
?  You have very mild COPD, for the most part your are not limited by this. No need for daily inhalers if they do not help. Try off Trelegy, if cough returns notify our office and we will send in RX. Stay active. Continue omeprazole 20mg  daily.  ? ?Orders: ?PFTs ? ?Follow-up ?6 months with Dr. Melvyn Novas (1 hour PFT prior) ?

## 2021-09-18 NOTE — Assessment & Plan Note (Signed)
-   Former smoker, quit smoking in 1983. S/P lobectomy in November 2016. Patient had mild obstruction on PFTs in 2021. He is minimally symptomatic. No clear benefit from Trelegy except for improvement in cough, however, he did start taking PPI at this time as well. Recommend trial off Trelegy, if cough worsens off maintenance inhaler advised he resume and call our office for prescription. FU in 6 months with repeat pulmonary function testing.  ?

## 2021-09-18 NOTE — Progress Notes (Signed)
? ?@Patient  ID: George Montoya., male    DOB: 1941-05-28, 81 y.o.   MRN: [id number] ? ?Chief Complaint  ?Patient presents with  ? Follow-up  ?  C/o cough-dry, wheeze, sob-same  ? ? ?Referring provider: ?Donnajean Lopes, MD ? ?HPI: ?81 year male, former smoker. PMH COPD GOLD 0, primary cancer left upper lung s/p lobectomy, HTN, CAD. Patient of Dr. Melvyn Novas, last seen on 01/25/20.  ? ?Previous [facility] pulmonary encounter: ?01/25/2020  f/u ov/Wert re: copd 0 / no better on lama/laba  ?    ?Chief Complaint  ?Patient presents with  ? Follow-up  ?    pt reports feeling at baseline status; no complaints. states here for 6 month check up  ? Dyspnea:  [place]  ?Cough: none  ?Sleeping: fine flat ?SABA use: none ?02: none  ?  ?09/18/2021- Interim hx  ?Patient presents today for 1 year follow-up. He is doing well, no acute respiratory complaints. States that he has a bad habit of breathing loudly with his mouth open. Once he corrects this there he has no issues. This does not particularly bother him. He denies shortness of breath. He walks 10,000 steps a day. He has learned to pace himself better. He had a dry cough. His GP put him on Trelegy 210mcg. At that same time he was started on omeprazole. He is no longer coughing at night.  ? ?Imaging: ?06/19/21 CT chest wo contrast  >> prior left upper lobectomy with stable post treatment changes. NO evidence of recurrent or metastatic disease. Aortic atherosclerosis.  ? ?Pulmonary function testing: ?PFT's  07/29/2019  FEV1 2.39 (88 % ) ratio 0.70  p 3 % improvement from saba p nothing  prior to study with DLCO  19.06 (82%) corrects to 3.54 (89%)  for alv volume and FV curve classic mild concavity  ? ?No Known Allergies ? ?Immunization History  ?Administered Date(s) Administered  ? Influenza, High Dose Seasonal PF 03/21/2019  ? Moderna Sars-Covid-2 Vaccination 07/14/2019, 08/10/2019  ? Pneumococcal-Unspecified 06/23/2004  ? Tdap 08/30/2017  ? Zoster Recombinat (Shingrix)  06/01/2017, 06/23/2017, 10/26/2017, 12/30/2017  ? ? ?Past Medical History:  ?Diagnosis Date  ? Aortic aneurysm ([place])   ? DR Cassandria Santee WATCHING   ? Arthritis   ? spine  ? Bowel obstruction ([place])   ? GERD (gastroesophageal reflux disease)   ? Headache   ? hx of 2 migraines as a college roommates  ? High cholesterol   ? Hypertension   ? Melanoma ([place])   ? also basal cell carcinomas  ? Meniere's disease of left ear   ? Radiation 07/03/2015-08/16/2015  ? Left Mid Chest, surgical clips 66 gray  ? Scoliosis deformity of spine   ? Shingles   ? 04/2014     RIGHT EYE, SCALP  ? Shingles   ? right eye, forehead and head  ? Shortness of breath dyspnea   ? WITH EXERTION   ? Sleep related leg cramps   ? Syncopal episodes   ? x 1 in October, 2015.  ? ? ?Tobacco History: ?Social History  ? ?Tobacco Use  ?Smoking Status Former  ? Packs/day: 2.00  ? Years: 25.00  ? Pack years: 50.00  ? Types: Cigarettes  ? Quit date: 10/21/1981  ? Years since quitting: 39.9  ?Smokeless Tobacco Never  ? ?Counseling given: Not Answered ? ? ?Outpatient Medications Prior to Visit  ?Medication Sig Dispense Refill  ? Calcium Carb-Cholecalciferol (CALCIUM 600 + D  PO) Take 600 mg by mouth daily.    ? carvedilol (COREG) 6.25 MG tablet Take 1 tablet (6.25 mg total) by mouth 2 (two) times daily. 180 tablet 3  ? Multiple Vitamin (MULTIVITAMIN WITH MINERALS) TABS tablet Take 2 tablets by mouth daily.    ? omeprazole (PRILOSEC) 20 MG capsule 1 capsule 30 minutes before morning meal    ? potassium chloride SA (K-DUR,KLOR-CON) 20 MEQ tablet 20 mEq 2 (two) times daily.     ? rosuvastatin (CRESTOR) 10 MG tablet Take 5 mg by mouth daily. In AM    ? telmisartan (MICARDIS) 80 MG tablet TAKE 1 TABLET ONCE DAILY. 90 tablet 3  ? famotidine (PEPCID) 20 MG tablet Take 1 tablet (20 mg total) by mouth at bedtime. (Patient not taking: Reported on 09/18/2021) 30 tablet 11  ? hydrochlorothiazide (MICROZIDE) 12.5 MG capsule TAKE ONE CAPSULE BY MOUTH DAILY (Patient not taking:  Reported on 09/18/2021) 90 capsule 2  ? ?Facility-Administered Medications Prior to Visit  ?Medication Dose Route Frequency Provider Last Rate Last Admin  ? 0.9 %  sodium chloride infusion  500 mL Intravenous Continuous Armbruster, Carlota Raspberry, MD      ? ? ?Review of Systems ? ?Review of Systems  ?Constitutional: Negative.   ?HENT: Negative.    ?Respiratory:  Negative for cough, chest tightness, shortness of breath and wheezing.   ?     Rare nocturnal cough, mild dyspnea  ?Cardiovascular: Negative.   ? ? ?Physical Exam ? ?BP (!) 152/82 (BP Location: Right Arm, Cuff Size: Normal)   Pulse 70   Temp 98.4 ?F (36.9 ?C) (Temporal)   Ht 5' 7.25" (1.708 m)   Wt 179 lb (81.2 kg)   SpO2 97%   BMI 27.83 kg/m?  ?Physical Exam ?Constitutional:   ?   Appearance: Normal appearance.  ?HENT:  ?   Head: Normocephalic and atraumatic.  ?   Mouth/Throat:  ?   Mouth: Mucous membranes are moist.  ?   Pharynx: Oropharynx is clear.  ?Cardiovascular:  ?   Rate and Rhythm: Normal rate and regular rhythm.  ?Pulmonary:  ?   Effort: Pulmonary effort is normal.  ?   Breath sounds: Normal breath sounds.  ?   Comments: Faint wheezing right base ?Musculoskeletal:     ?   General: Normal range of motion.  ?Skin: ?   General: Skin is warm and dry.  ?Neurological:  ?   General: No focal deficit present.  ?   Mental Status: He is alert and oriented to person, place, and time. Mental status is at baseline.  ?Psychiatric:     ?   Mood and Affect: Mood normal.     ?   Behavior: Behavior normal.     ?   Thought Content: Thought content normal.     ?   Judgment: Judgment normal.  ?  ? ?Lab Results: ? ?CBC ?   ?Component Value Date/Time  ? WBC 5.8 05/03/2019 1542  ? RBC 3.82 (L) 05/03/2019 1542  ? HGB 14.0 05/03/2019 1542  ? HCT 40.7 05/03/2019 1542  ? PLT 194.0 05/03/2019 1542  ? MCV 106.4 (H) 05/03/2019 1542  ? MCV 102.6 (A) 02/01/2014 1635  ? MCH 35.6 (H) 10/09/2017 4854  ? MCHC 34.3 05/03/2019 1542  ? RDW 12.7 05/03/2019 1542  ? LYMPHSABS 0.7 05/03/2019  1542  ? MONOABS 0.8 05/03/2019 1542  ? EOSABS 0.1 05/03/2019 1542  ? BASOSABS 0.0 05/03/2019 1542  ? ? ?BMET ?   ?Component  Value Date/Time  ? NA 130 (L) 05/03/2019 1542  ? K 4.2 05/03/2019 1542  ? CL 92 (L) 05/03/2019 1542  ? CO2 30 05/03/2019 1542  ? GLUCOSE 104 (H) 05/03/2019 1542  ? BUN 16 05/03/2019 1542  ? CREATININE 0.83 05/03/2019 1542  ? CREATININE 1.1 10/04/2015 1438  ? CALCIUM 9.9 05/03/2019 1542  ? GFRNONAA >60 05/22/2015 0450  ? GFRAA >60 05/22/2015 0450  ? ? ?BNP ?No results found for: BNP ? ?ProBNP ?   ?Component Value Date/Time  ? PROBNP 66.0 05/03/2019 1542  ? ? ?Imaging: ?No results found. ? ? ?Assessment & Plan:  ? ?COPD GOLD 0 ?- Former smoker, quit smoking in 1983. S/P lobectomy in November 2016. Patient had mild obstruction on PFTs in 2021. He is minimally symptomatic. No clear benefit from Trelegy except for improvement in cough, however, he did start taking PPI at this time as well. Recommend trial off Trelegy, if cough worsens off maintenance inhaler advised he resume and call our office for prescription. FU in 6 months with repeat pulmonary function testing.  ? ?GERD (gastroesophageal reflux disease) ?- Nocturnal cough resolved while on PPI  ?- Continue Omeprazole 20mg  daily  ? ? ?Martyn Ehrich, NP ?09/18/2021 ? ?

## 2021-09-18 NOTE — Assessment & Plan Note (Addendum)
-   Nocturnal cough resolved while on PPI  ?- Continue Omeprazole 20mg  daily  ?

## 2021-09-19 DIAGNOSIS — H8109 Meniere's disease, unspecified ear: Secondary | ICD-10-CM | POA: Diagnosis not present

## 2021-09-19 DIAGNOSIS — E871 Hypo-osmolality and hyponatremia: Secondary | ICD-10-CM | POA: Diagnosis not present

## 2021-09-19 DIAGNOSIS — R5383 Other fatigue: Secondary | ICD-10-CM | POA: Diagnosis not present

## 2021-09-19 DIAGNOSIS — I10 Essential (primary) hypertension: Secondary | ICD-10-CM | POA: Diagnosis not present

## 2021-10-03 DIAGNOSIS — E871 Hypo-osmolality and hyponatremia: Secondary | ICD-10-CM | POA: Diagnosis not present

## 2021-10-25 ENCOUNTER — Ambulatory Visit
Admission: RE | Admit: 2021-10-25 | Discharge: 2021-10-25 | Disposition: A | Discharge status: Home / Self Care | Payer: PPO | Source: Ambulatory Visit | Attending: Registered Nurse | Admitting: Registered Nurse

## 2021-10-25 ENCOUNTER — Other Ambulatory Visit: Payer: Self-pay | Admitting: Registered Nurse

## 2021-10-25 DIAGNOSIS — R112 Nausea with vomiting, unspecified: Secondary | ICD-10-CM

## 2021-10-25 DIAGNOSIS — I10 Essential (primary) hypertension: Secondary | ICD-10-CM | POA: Diagnosis not present

## 2021-10-25 DIAGNOSIS — R1084 Generalized abdominal pain: Secondary | ICD-10-CM

## 2021-10-25 DIAGNOSIS — K5730 Diverticulosis of large intestine without perforation or abscess without bleeding: Secondary | ICD-10-CM | POA: Diagnosis not present

## 2021-10-25 DIAGNOSIS — E871 Hypo-osmolality and hyponatremia: Secondary | ICD-10-CM | POA: Diagnosis not present

## 2021-10-25 DIAGNOSIS — H8109 Meniere's disease, unspecified ear: Secondary | ICD-10-CM | POA: Diagnosis not present

## 2021-10-25 DIAGNOSIS — K4090 Unilateral inguinal hernia, without obstruction or gangrene, not specified as recurrent: Secondary | ICD-10-CM | POA: Diagnosis not present

## 2021-10-25 DIAGNOSIS — M47816 Spondylosis without myelopathy or radiculopathy, lumbar region: Secondary | ICD-10-CM | POA: Diagnosis not present

## 2021-10-25 MED ORDER — IOPAMIDOL (ISOVUE-300) INJECTION 61%
100.0000 mL | Freq: Once | INTRAVENOUS | Status: AC | PRN
  Administered 2021-10-25: 100 mL via INTRAVENOUS

## 2021-10-30 DIAGNOSIS — H8109 Meniere's disease, unspecified ear: Secondary | ICD-10-CM | POA: Diagnosis not present

## 2021-10-30 DIAGNOSIS — I10 Essential (primary) hypertension: Secondary | ICD-10-CM | POA: Diagnosis not present

## 2021-10-30 DIAGNOSIS — E785 Hyperlipidemia, unspecified: Secondary | ICD-10-CM | POA: Diagnosis not present

## 2021-10-30 DIAGNOSIS — E871 Hypo-osmolality and hyponatremia: Secondary | ICD-10-CM | POA: Diagnosis not present

## 2021-10-31 ENCOUNTER — Encounter: Payer: Self-pay | Admitting: Gastroenterology

## 2021-11-06 DIAGNOSIS — L718 Other rosacea: Secondary | ICD-10-CM | POA: Diagnosis not present

## 2021-11-06 DIAGNOSIS — Z85828 Personal history of other malignant neoplasm of skin: Secondary | ICD-10-CM | POA: Diagnosis not present

## 2021-11-13 DIAGNOSIS — E871 Hypo-osmolality and hyponatremia: Secondary | ICD-10-CM | POA: Diagnosis not present

## 2021-11-13 DIAGNOSIS — E785 Hyperlipidemia, unspecified: Secondary | ICD-10-CM | POA: Diagnosis not present

## 2021-11-13 DIAGNOSIS — I10 Essential (primary) hypertension: Secondary | ICD-10-CM | POA: Diagnosis not present

## 2021-11-13 DIAGNOSIS — C3412 Malignant neoplasm of upper lobe, left bronchus or lung: Secondary | ICD-10-CM | POA: Diagnosis not present

## 2021-12-02 DIAGNOSIS — D485 Neoplasm of uncertain behavior of skin: Secondary | ICD-10-CM | POA: Diagnosis not present

## 2021-12-02 DIAGNOSIS — E871 Hypo-osmolality and hyponatremia: Secondary | ICD-10-CM | POA: Diagnosis not present

## 2021-12-02 DIAGNOSIS — I10 Essential (primary) hypertension: Secondary | ICD-10-CM | POA: Diagnosis not present

## 2021-12-02 DIAGNOSIS — D1801 Hemangioma of skin and subcutaneous tissue: Secondary | ICD-10-CM | POA: Diagnosis not present

## 2021-12-02 DIAGNOSIS — Z85828 Personal history of other malignant neoplasm of skin: Secondary | ICD-10-CM | POA: Diagnosis not present

## 2021-12-02 DIAGNOSIS — Z85820 Personal history of malignant melanoma of skin: Secondary | ICD-10-CM | POA: Diagnosis not present

## 2021-12-02 DIAGNOSIS — L821 Other seborrheic keratosis: Secondary | ICD-10-CM | POA: Diagnosis not present

## 2021-12-27 DIAGNOSIS — Z20822 Contact with and (suspected) exposure to covid-19: Secondary | ICD-10-CM | POA: Diagnosis not present

## 2021-12-27 DIAGNOSIS — R059 Cough, unspecified: Secondary | ICD-10-CM | POA: Diagnosis not present

## 2021-12-27 DIAGNOSIS — J069 Acute upper respiratory infection, unspecified: Secondary | ICD-10-CM | POA: Diagnosis not present

## 2022-01-06 ENCOUNTER — Other Ambulatory Visit: Payer: Self-pay | Admitting: Cardiovascular Disease

## 2022-01-06 DIAGNOSIS — R112 Nausea with vomiting, unspecified: Secondary | ICD-10-CM | POA: Diagnosis not present

## 2022-01-06 DIAGNOSIS — E785 Hyperlipidemia, unspecified: Secondary | ICD-10-CM | POA: Diagnosis not present

## 2022-01-06 DIAGNOSIS — I10 Essential (primary) hypertension: Secondary | ICD-10-CM | POA: Diagnosis not present

## 2022-01-06 DIAGNOSIS — E871 Hypo-osmolality and hyponatremia: Secondary | ICD-10-CM | POA: Diagnosis not present

## 2022-02-12 DIAGNOSIS — H903 Sensorineural hearing loss, bilateral: Secondary | ICD-10-CM | POA: Diagnosis not present

## 2022-02-12 DIAGNOSIS — H8102 Meniere's disease, left ear: Secondary | ICD-10-CM | POA: Diagnosis not present

## 2022-02-12 DIAGNOSIS — H6122 Impacted cerumen, left ear: Secondary | ICD-10-CM | POA: Diagnosis not present

## 2022-02-25 DIAGNOSIS — R7989 Other specified abnormal findings of blood chemistry: Secondary | ICD-10-CM | POA: Diagnosis not present

## 2022-02-25 DIAGNOSIS — I10 Essential (primary) hypertension: Secondary | ICD-10-CM | POA: Diagnosis not present

## 2022-04-14 DIAGNOSIS — L259 Unspecified contact dermatitis, unspecified cause: Secondary | ICD-10-CM | POA: Diagnosis not present

## 2022-04-14 DIAGNOSIS — I10 Essential (primary) hypertension: Secondary | ICD-10-CM | POA: Diagnosis not present

## 2022-04-14 DIAGNOSIS — E871 Hypo-osmolality and hyponatremia: Secondary | ICD-10-CM | POA: Diagnosis not present

## 2022-04-26 ENCOUNTER — Other Ambulatory Visit: Payer: Self-pay | Admitting: Cardiovascular Disease

## 2022-05-01 ENCOUNTER — Other Ambulatory Visit: Payer: Self-pay | Admitting: Surgery

## 2022-05-01 DIAGNOSIS — R911 Solitary pulmonary nodule: Secondary | ICD-10-CM

## 2022-05-07 ENCOUNTER — Other Ambulatory Visit: Payer: Self-pay | Admitting: Cardiovascular Disease

## 2022-06-03 DIAGNOSIS — Z85828 Personal history of other malignant neoplasm of skin: Secondary | ICD-10-CM | POA: Diagnosis not present

## 2022-06-03 DIAGNOSIS — L308 Other specified dermatitis: Secondary | ICD-10-CM | POA: Diagnosis not present

## 2022-06-03 DIAGNOSIS — L570 Actinic keratosis: Secondary | ICD-10-CM | POA: Diagnosis not present

## 2022-06-03 DIAGNOSIS — D485 Neoplasm of uncertain behavior of skin: Secondary | ICD-10-CM | POA: Diagnosis not present

## 2022-06-03 DIAGNOSIS — D1801 Hemangioma of skin and subcutaneous tissue: Secondary | ICD-10-CM | POA: Diagnosis not present

## 2022-06-03 DIAGNOSIS — Z85820 Personal history of malignant melanoma of skin: Secondary | ICD-10-CM | POA: Diagnosis not present

## 2022-06-03 DIAGNOSIS — C44311 Basal cell carcinoma of skin of nose: Secondary | ICD-10-CM | POA: Diagnosis not present

## 2022-06-03 DIAGNOSIS — L820 Inflamed seborrheic keratosis: Secondary | ICD-10-CM | POA: Diagnosis not present

## 2022-06-03 DIAGNOSIS — L821 Other seborrheic keratosis: Secondary | ICD-10-CM | POA: Diagnosis not present

## 2022-06-26 ENCOUNTER — Encounter: Payer: Self-pay | Admitting: Surgery

## 2022-06-26 ENCOUNTER — Ambulatory Visit: Payer: PPO | Admitting: Surgery

## 2022-06-26 ENCOUNTER — Ambulatory Visit
Admission: RE | Admit: 2022-06-26 | Discharge: 2022-06-26 | Disposition: A | Discharge status: Home / Self Care | Payer: PPO | Source: Ambulatory Visit | Attending: Surgery | Admitting: Surgery

## 2022-06-26 VITALS — BP 177/88 | HR 57 | Resp 20 | Ht 67.0 in | Wt 179.0 lb

## 2022-06-26 DIAGNOSIS — R911 Solitary pulmonary nodule: Secondary | ICD-10-CM | POA: Diagnosis not present

## 2022-06-26 DIAGNOSIS — J479 Bronchiectasis, uncomplicated: Secondary | ICD-10-CM | POA: Diagnosis not present

## 2022-06-26 NOTE — Progress Notes (Signed)
HPI:  The patient returns for lung cancer surveillance s/p left upper lobectomy for a pT1b, pN0 stage IA well differentiated adenocarcinoma on 05/21/2015. He did not have a fissure between the upper and lower lobes and the stapled resection margin was positive. He was not a candidate for a completion pneumonectomy and received XRT to 66 gray in 33 fractions by Dr. Sondra Come completed on 08/16/2015.  I last saw him in the office in December 2021 and he was seen by one of my physician assistants in December 2022.  He has continued to do well overall and is walking about 3 miles per day.  He still reports some exertional fatigue although he remains very active for an 82 year old gentleman.  He has had some chronic exertional shortness of breath in the past.  He denies any chest pain or pressure.  He has had no lower extremity edema.  He reports having hyponatremia which has been an issue for him for many years.  Current Outpatient Medications  Medication Sig Dispense Refill   Calcium Carb-Cholecalciferol (CALCIUM 600 + D PO) Take 600 mg by mouth daily.     carvedilol (COREG) 6.25 MG tablet Take 1 tablet (6.25 mg total) by mouth 2 (two) times daily. 180 tablet 0   famotidine (PEPCID) 20 MG tablet Take 1 tablet (20 mg total) by mouth at bedtime. 30 tablet 11   hydrochlorothiazide (MICROZIDE) 12.5 MG capsule TAKE ONE CAPSULE BY MOUTH DAILY 90 capsule 2   Multiple Vitamin (MULTIVITAMIN WITH MINERALS) TABS tablet Take 2 tablets by mouth daily.     omeprazole (PRILOSEC) 20 MG capsule 1 capsule 30 minutes before morning meal     potassium chloride SA (K-DUR,KLOR-CON) 20 MEQ tablet 20 mEq 2 (two) times daily.      rosuvastatin (CRESTOR) 10 MG tablet Take 10 mg by mouth daily. In AM     telmisartan (MICARDIS) 80 MG tablet TAKE ONE TABLET BY MOUTH ONCE DAILY 90 tablet 0   Current Facility-Administered Medications  Medication Dose Route Frequency Provider Last Rate Last Admin   0.9 %  sodium chloride infusion   500 mL Intravenous Continuous Armbruster, Carlota Raspberry, MD         Physical Exam: BP (!) 177/88   Pulse (!) 57   Resp 20   Ht 5\' 7"  (1.702 m)   Wt 179 lb (81.2 kg)   SpO2 98% Comment: RA  BMI 28.04 kg/m  He looks well. There is no cervical or supraclavicular adenopathy. Cardiac exam shows a regular rate and rhythm with normal heart sounds. Lungs are clear.  Diagnostic Tests:  Narrative & Impression  CLINICAL DATA:  History of lung carcinoma. Status post lumpectomy and radiation therapy. New lung nodule. * Tracking Code: BO *   EXAM: CT CHEST WITHOUT CONTRAST   TECHNIQUE: Multidetector CT imaging of the chest was performed following the standard protocol without IV contrast.   RADIATION DOSE REDUCTION: This exam was performed according to the departmental dose-optimization program which includes automated exposure control, adjustment of the mA and/or kV according to patient size and/or use of iterative reconstruction technique.   COMPARISON:  None Available.   FINDINGS: Cardiovascular: Coronary artery calcification and aortic atherosclerotic calcification.   Mediastinum/Nodes: No axillary or supraclavicular adenopathy. No mediastinal or hilar adenopathy. No pericardial fluid. Esophagus normal.   Lungs/Pleura: Postsurgical consolidation in the LEFT upper hemithorax with bronchiectasis following LEFT upper lobectomy. No change from comparison exam.   No suspicious nodules within LEFT or RIGHT lung. RIGHT  lung is hyperinflated.   Upper Abdomen: Limited view of the liver, kidneys, pancreas are unremarkable. Normal adrenal glands.   Musculoskeletal: No aggressive osseous lesion.   IMPRESSION: 1. No evidence of lung cancer recurrence. 2. Stable postoperative consolidation and bronchiectasis in the LEFT upper lung. 3. Coronary artery calcification and Aortic Atherosclerosis (ICD10-I70.0).     Electronically Signed   By: Suzy Bouchard M.D.   On: 06/26/2022  12:58      Impression:  He continues to do well almost 8 years following lung cancer resection and postoperative radiation therapy with no evidence of recurrent cancer on chest CT.  I reviewed the CT images with him and answered his questions.  I encouraged him to continue remaining as active as possible.  Plan:  I will plan to see him back in 1 year with a CT scan of the chest for lung cancer surveillance.  I spent 20 minutes performing this established patient evaluation and > 50% of this time was spent face to face counseling and coordinating the surveillance of his previously resected lung cancer.   Gaye Pollack, MD [facility] [phone]

## 2022-07-02 DIAGNOSIS — R030 Elevated blood-pressure reading, without diagnosis of hypertension: Secondary | ICD-10-CM | POA: Diagnosis not present

## 2022-07-02 DIAGNOSIS — Z125 Encounter for screening for malignant neoplasm of prostate: Secondary | ICD-10-CM | POA: Diagnosis not present

## 2022-07-02 DIAGNOSIS — I10 Essential (primary) hypertension: Secondary | ICD-10-CM | POA: Diagnosis not present

## 2022-07-02 DIAGNOSIS — E785 Hyperlipidemia, unspecified: Secondary | ICD-10-CM | POA: Diagnosis not present

## 2022-07-15 DIAGNOSIS — Z85828 Personal history of other malignant neoplasm of skin: Secondary | ICD-10-CM | POA: Diagnosis not present

## 2022-07-15 DIAGNOSIS — C44311 Basal cell carcinoma of skin of nose: Secondary | ICD-10-CM | POA: Diagnosis not present

## 2022-07-17 DIAGNOSIS — I700 Atherosclerosis of aorta: Secondary | ICD-10-CM | POA: Diagnosis not present

## 2022-07-17 DIAGNOSIS — I10 Essential (primary) hypertension: Secondary | ICD-10-CM | POA: Diagnosis not present

## 2022-07-17 DIAGNOSIS — J449 Chronic obstructive pulmonary disease, unspecified: Secondary | ICD-10-CM | POA: Diagnosis not present

## 2022-07-17 DIAGNOSIS — Z0000 Encounter for general adult medical examination without abnormal findings: Secondary | ICD-10-CM | POA: Diagnosis not present

## 2022-07-17 DIAGNOSIS — I77810 Thoracic aortic ectasia: Secondary | ICD-10-CM | POA: Diagnosis not present

## 2022-07-17 DIAGNOSIS — H8109 Meniere's disease, unspecified ear: Secondary | ICD-10-CM | POA: Diagnosis not present

## 2022-07-17 DIAGNOSIS — I2510 Atherosclerotic heart disease of native coronary artery without angina pectoris: Secondary | ICD-10-CM | POA: Diagnosis not present

## 2022-07-17 DIAGNOSIS — J3800 Paralysis of vocal cords and larynx, unspecified: Secondary | ICD-10-CM | POA: Diagnosis not present

## 2022-07-17 DIAGNOSIS — E785 Hyperlipidemia, unspecified: Secondary | ICD-10-CM | POA: Diagnosis not present

## 2022-07-17 DIAGNOSIS — R82998 Other abnormal findings in urine: Secondary | ICD-10-CM | POA: Diagnosis not present

## 2022-07-29 ENCOUNTER — Other Ambulatory Visit: Payer: Self-pay | Admitting: Cardiovascular Disease

## 2022-07-29 DIAGNOSIS — L570 Actinic keratosis: Secondary | ICD-10-CM | POA: Diagnosis not present

## 2022-07-29 DIAGNOSIS — Z85828 Personal history of other malignant neoplasm of skin: Secondary | ICD-10-CM | POA: Diagnosis not present

## 2022-08-14 DIAGNOSIS — H8102 Meniere's disease, left ear: Secondary | ICD-10-CM | POA: Diagnosis not present

## 2022-08-14 DIAGNOSIS — H903 Sensorineural hearing loss, bilateral: Secondary | ICD-10-CM | POA: Diagnosis not present

## 2022-08-15 ENCOUNTER — Other Ambulatory Visit: Payer: Self-pay | Admitting: Cardiovascular Disease

## 2022-09-02 DIAGNOSIS — M5440 Lumbago with sciatica, unspecified side: Secondary | ICD-10-CM | POA: Diagnosis not present

## 2022-09-02 DIAGNOSIS — G959 Disease of spinal cord, unspecified: Secondary | ICD-10-CM | POA: Diagnosis not present

## 2022-09-03 DIAGNOSIS — M5440 Lumbago with sciatica, unspecified side: Secondary | ICD-10-CM | POA: Diagnosis not present

## 2022-09-05 DIAGNOSIS — M5440 Lumbago with sciatica, unspecified side: Secondary | ICD-10-CM | POA: Diagnosis not present

## 2022-09-08 DIAGNOSIS — M5440 Lumbago with sciatica, unspecified side: Secondary | ICD-10-CM | POA: Diagnosis not present

## 2022-09-09 ENCOUNTER — Encounter: Payer: Self-pay | Admitting: Cardiovascular Disease

## 2022-09-09 ENCOUNTER — Ambulatory Visit: Payer: PPO | Attending: Cardiovascular Disease | Admitting: Cardiovascular Disease

## 2022-09-09 VITALS — BP 134/84 | HR 59 | Ht 67.0 in | Wt 177.4 lb

## 2022-09-09 DIAGNOSIS — Z87898 Personal history of other specified conditions: Secondary | ICD-10-CM | POA: Diagnosis not present

## 2022-09-09 DIAGNOSIS — E7800 Pure hypercholesterolemia, unspecified: Secondary | ICD-10-CM

## 2022-09-09 DIAGNOSIS — I700 Atherosclerosis of aorta: Secondary | ICD-10-CM

## 2022-09-09 DIAGNOSIS — I10 Essential (primary) hypertension: Secondary | ICD-10-CM

## 2022-09-09 DIAGNOSIS — I77810 Thoracic aortic ectasia: Secondary | ICD-10-CM

## 2022-09-09 NOTE — Progress Notes (Signed)
Cardiology Office Note:    Date:  09/14/2022   ID:  [name]., DOB 1941/03/14, MRN [id number]  PCP:  Donnajean Lopes, MD   [facility] Providers Cardiologist:  Sanda Klein, MD     Referring MD: Donnajean Lopes, MD   No chief complaint on file.   History of Present Illness:    George Montoya. is a 82 y.o. male with a hx of syncope with a pattern suggestive of orthostatic hypotension/vasovagal syncope, Mnire's disease, essential hypertension, hypercholesterolemia, aortic atherosclerosis, borderline dilation of the thoracic aorta, lung cancer status postsurgery and radiation therapy.  It's been almost a year and a half since his last appointment and he has not had any syncopal events since then. He does have some occasional mild dizziness related to orthostatic hypotension, but he is allowed to change position gradually.  He has not had any falls. He denies problems with chest pain or shortness of breath but continues to have dysphonia following his left recurrent laryngeal nerve injury (surgery and radiation therapy for lung cancer).  His hearing has deteriorated, but his problems with Mnire's disease related vertigo have settled down.    The patient specifically denies any chest pain at rest exertion, dyspnea at rest or with exertion, orthopnea, paroxysmal nocturnal dyspnea, syncope, palpitations, focal neurological deficits, intermittent claudication, lower extremity edema, unexplained weight gain, cough, hemoptysis or wheezing.   He underwent a chest CT for surveillance of his lung cancer and once again the radiologist did not actually comment on any dilation of the ascending aorta.  The maximum diameter I can measure is 4.0 cm which is a little less than last year.  Maximum recorded diameter has been 4.2 cm.   He has now had 3 syncopal events.  In 2015 at work he had an episode of syncope that was probably associated with hypotension due to an acute diarrheal  illness.  It was associated with some prodromal symptoms.  However he has had syncope twice this year in March and in April.  The episodes were similar.  They both occurred when he jumped out of bed to use the restroom at around 2 or 3:00 in the morning.  He does not recall dizziness, lightheadedness, diaphoresis, nausea, flushing or any of the typical prodromal symptoms of a vasovagal event.  On both occasions he had head impact, had bleeding lacerations and required stitches, thankfully no intracranial bleeding.  Neither episode was associated with tonic-clonic movements, incontinence or postictal state.  The first time he was in his daughter's home and thinks he was simply unfamiliar with the surroundings and hit an obstacle.  The second syncopal event however happened in his home.  Echocardiogram in 2015 was essentially a normal study.  He had a normal nuclear stress test in 2016.   He wore an event monitor in June 2019 with completely normal results.  He has mild ectasia of the ascending thoracic aorta measured at 4 cm by CT, where he also had evidence of coronary and aortic atherosclerosis.  He has follow-up chest CT scans every 6-12 months and his aorta has not been described as dilated on the most recent studies.  He has previous undergone a left upper lobectomy for a stage I well-differentiated adenocarcinoma of the lung in 2016, followed by XRT. He has chronic XRT related changes in the left lower lobe.   Past Medical History:  Diagnosis Date   Aortic aneurysm ([place])    DR Cassandria Santee  WATCHING    Arthritis    spine   Bowel obstruction ([facility])    GERD (gastroesophageal reflux disease)    Headache    hx of 2 migraines as a college roommates   High cholesterol    Hypertension    Melanoma ([place])    also basal cell carcinomas   Meniere's disease of left ear    Radiation 07/03/2015-08/16/2015   Left Mid Chest, surgical clips 66 gray   Scoliosis deformity of spine    Shingles    04/2014      RIGHT EYE, SCALP   Shingles    right eye, forehead and head   Shortness of breath dyspnea    WITH EXERTION    Sleep related leg cramps    Syncopal episodes    x 1 in October, 2015.    Past Surgical History:  Procedure Laterality Date   APPENDECTOMY     CATARACT EXTRACTION W/ INTRAOCULAR LENS  IMPLANT, BILATERAL     COLON SURGERY     6 YRS AGO  "SNIPPED BAND BINDING INTESTINE"   COLONOSCOPY     HERNIA REPAIR     MELANOMA EXCISION     pilonadal cyst removed     surgery for bowel obstruction     THORACOTOMY/LOBECTOMY Left 05/21/2015   Procedure: LEFT THORACOTOMY/LEFT UPPER LOBECTOMY;  Surgeon: Gaye Pollack, MD;  Location: [facility];  Service: Thoracic;  Laterality: Left;   TONSILLECTOMY     VASECTOMY     VIDEO BRONCHOSCOPY WITH ENDOBRONCHIAL NAVIGATION N/A 10/18/2014   Procedure: VIDEO BRONCHOSCOPY WITH ENDOBRONCHIAL NAVIGATION;  Surgeon: Collene Gobble, MD;  Location: [facility];  Service: Thoracic;  Laterality: N/A;    Current Medications: Current Meds  Medication Sig   Calcium Carb-Cholecalciferol (CALCIUM 600 + D PO) Take 600 mg by mouth daily.   carvedilol (COREG) 6.25 MG tablet TAKE ONE TABLET BY MOUTH TWICE DAILY   famotidine (PEPCID) 20 MG tablet Take 1 tablet (20 mg total) by mouth at bedtime.   hydrochlorothiazide (MICROZIDE) 12.5 MG capsule TAKE ONE CAPSULE BY MOUTH DAILY   Multiple Vitamin (MULTIVITAMIN WITH MINERALS) TABS tablet Take 2 tablets by mouth daily.   omeprazole (PRILOSEC) 20 MG capsule 1 capsule 30 minutes before morning meal   potassium chloride SA (K-DUR,KLOR-CON) 20 MEQ tablet 20 mEq 2 (two) times daily.    rosuvastatin (CRESTOR) 10 MG tablet Take 10 mg by mouth daily. In AM   telmisartan (MICARDIS) 80 MG tablet TAKE ONE TABLET BY MOUTH ONCE DAILY   Current Facility-Administered Medications for the 09/09/22 encounter (Office Visit) with Zyionna Pesce, MD  Medication   0.9 %  sodium chloride infusion     Allergies:   Amlodipine and Hydrochlorothiazide  w-triamterene   Social History   Socioeconomic History   Marital status: Married    Spouse name: Not on file   Number of children: Not on file   Years of education: Not on file   Highest education level: Not on file  Occupational History   Occupation: executive  Tobacco Use   Smoking status: Former    Packs/day: 2.00    Years: 25.00    Additional pack years: 0.00    Total pack years: 50.00    Types: Cigarettes    Quit date: 10/21/1981    Years since quitting: 40.9   Smokeless tobacco: Never  Substance and Sexual Activity   Alcohol use: Yes    Alcohol/week: 20.0 standard drinks of alcohol    Types: 20 Standard drinks  or equivalent per week    Comment: occ   Drug use: No   Sexual activity: Not on file  Other Topics Concern   Not on file  Social History Narrative   Not on file   Social Determinants of Health   Financial Resource Strain: Not on file  Food Insecurity: Not on file  Transportation Needs: Not on file  Physical Activity: Not on file  Stress: Not on file  Social Connections: Not on file     Family History: The patient's family history includes Breast cancer in his mother; Prostate cancer in his father.  ROS:   Please see the history of present illness.     All other systems reviewed and are negative.  EKGs/Labs/Other Studies Reviewed:    The following studies were reviewed today: Chest CT 06/13/2020  EKG:  EKG is  ordered today.  The ekg ordered today demonstrates normal sinus rhythm, normal ECG  Recent Labs: No results found for requested labs within last 365 days.  Recent Lipid Panel No results found for: "CHOL", "TRIG", "HDL", "CHOLHDL", "VLDL", "LDLCALC", "LDLDIRECT" 04/24/2020 Cholesterol 170, HDL 91, LDL 59, triglycerides 99  07/02/2022 Cholesterol 167, HDL 80, LDL 61, triglycerides 129 Potassium 4.5, ALT 61, creatinine 0.9, TSH 1.140  Risk Assessment/Calculations:           Physical Exam:    VS:  BP 134/84   Pulse (!) 59   Ht  5\' 7"  (1.702 m)   Wt 177 lb 6.4 oz (80.5 kg)   SpO2 99%   BMI 27.78 kg/m     Wt Readings from Last 3 Encounters:  09/09/22 177 lb 6.4 oz (80.5 kg)  06/26/22 179 lb (81.2 kg)  09/18/21 179 lb (81.2 kg)      General: Alert, oriented x3, no distress, appears well Head: no evidence of trauma, PERRL, EOMI, no exophtalmos or lid lag, no myxedema, no xanthelasma; normal ears, nose and oropharynx Neck: normal jugular venous pulsations and no hepatojugular reflux; brisk carotid pulses without delay and no carotid bruits Chest: clear to auscultation, no signs of consolidation by percussion or palpation, normal fremitus, symmetrical and full respiratory excursions Cardiovascular: normal position and quality of the apical impulse, regular rhythm, normal first and second heart sounds, no murmurs, rubs or gallops Abdomen: no tenderness or distention, no masses by palpation, no abnormal pulsatility or arterial bruits, normal bowel sounds, no hepatosplenomegaly Extremities: no clubbing, cyanosis or edema; 2+ radial, ulnar and brachial pulses bilaterally; 2+ right femoral, posterior tibial and dorsalis pedis pulses; 2+ left femoral, posterior tibial and dorsalis pedis pulses; no subclavian or femoral bruits Neurological: grossly nonfocal Psych: Normal mood and affect   ASSESSMENT:    1. Essential hypertension     PLAN:    In order of problems listed above:  Syncope: He has had 3 events in the past 10 years, none since his last appointment.  The pattern has been very suggestive of orthostatic hypotension and/or vasovagal syncope.  Avoid "perfect" blood pressure control.  Target 140/90.  Stay well-hydrated, avoid diuretics.  Change position gradually, as he is doing. HTN: Will controlled within our desired parameters. Aortic/coronary atherosclerosis: Asymptomatic.  Low risk nuclear stress test in 2016.  Focus on risk factor modification. Mild dilation of the ascending aorta: He gets frequent CTs to  monitor the progression of the changes in his lung following lung cancer therapy.  The aorta does not appear to be changed in size.  On ARB plus beta-blocker for his blood pressure, ideal  combination to prevent aortic dilation. HLP: On statin.  All lipid parameters well within target range.  LDL 61, well within our target of less than 70. COPD/lung cancer/left recurrent laryngeal nerve palsy: No evidence of recurrent neoplasm status post surgery and XRT.  Does not have a lot of problems with shortness of breath.  Tolerates carvedilol without worsening wheezing.  Quit smoking over 20 years ago.           Medication Adjustments/Labs and Tests Ordered: Current medicines are reviewed at length with the patient today.  Concerns regarding medicines are outlined above.  Orders Placed This Encounter  Procedures   EKG 12-Lead   No orders of the defined types were placed in this encounter.    Patient Instructions  Medication Instructions:  No changes *If you need a refill on your cardiac medications before your next appointment, please call your pharmacy*   Follow-Up: At [facility], you and your health needs are our priority.  As part of our continuing mission to provide you with exceptional heart care, we have created designated Provider Care Teams.  These Care Teams include your primary Cardiologist (physician) and Advanced Practice Providers (APPs -  Physician Assistants and Nurse Practitioners) who all work together to provide you with the care you need, when you need it.  We recommend signing up for the patient portal called "MyChart".  Sign up information is provided on this After Visit Summary.  MyChart is used to connect with patients for Virtual Visits (Telemedicine).  Patients are able to view lab/test results, encounter notes, upcoming appointments, etc.  Non-urgent messages can be sent to your provider as well.   To learn more about what you can do with MyChart, go to  [email or web address].    Your next appointment:   1 year(s)  Provider:   Sanda Klein, MD     Signed, Sanda Klein, MD  09/14/2022 4:06 PM    [place]

## 2022-09-09 NOTE — Patient Instructions (Signed)
Medication Instructions:  No changes *If you need a refill on your cardiac medications before your next appointment, please call your pharmacy*  Follow-Up: At [facility], you and your health needs are our priority.  As part of our continuing mission to provide you with exceptional heart care, we have created designated Provider Care Teams.  These Care Teams include your primary Cardiologist (physician) and Advanced Practice Providers (APPs -  Physician Assistants and Nurse Practitioners) who all work together to provide you with the care you need, when you need it.  We recommend signing up for the patient portal called "MyChart".  Sign up information is provided on this After Visit Summary.  MyChart is used to connect with patients for Virtual Visits (Telemedicine).  Patients are able to view lab/test results, encounter notes, upcoming appointments, etc.  Non-urgent messages can be sent to your provider as well.   To learn more about what you can do with MyChart, go to [email or web address].    Your next appointment:   1 year(s)  Provider:   Mihai Croitoru, MD      

## 2022-09-16 DIAGNOSIS — M5440 Lumbago with sciatica, unspecified side: Secondary | ICD-10-CM | POA: Diagnosis not present

## 2022-09-18 DIAGNOSIS — G959 Disease of spinal cord, unspecified: Secondary | ICD-10-CM | POA: Diagnosis not present

## 2022-09-18 DIAGNOSIS — M47814 Spondylosis without myelopathy or radiculopathy, thoracic region: Secondary | ICD-10-CM | POA: Diagnosis not present

## 2022-09-18 DIAGNOSIS — M47816 Spondylosis without myelopathy or radiculopathy, lumbar region: Secondary | ICD-10-CM | POA: Diagnosis not present

## 2022-09-18 DIAGNOSIS — M5440 Lumbago with sciatica, unspecified side: Secondary | ICD-10-CM | POA: Diagnosis not present

## 2022-09-18 DIAGNOSIS — M5124 Other intervertebral disc displacement, thoracic region: Secondary | ICD-10-CM | POA: Diagnosis not present

## 2022-09-18 DIAGNOSIS — M4804 Spinal stenosis, thoracic region: Secondary | ICD-10-CM | POA: Diagnosis not present

## 2022-09-18 DIAGNOSIS — M48061 Spinal stenosis, lumbar region without neurogenic claudication: Secondary | ICD-10-CM | POA: Diagnosis not present

## 2022-09-19 ENCOUNTER — Other Ambulatory Visit: Payer: Self-pay | Admitting: Cardiovascular Disease

## 2022-09-19 ENCOUNTER — Other Ambulatory Visit: Payer: Self-pay

## 2022-09-19 MED ORDER — CARVEDILOL 6.25 MG PO TABS: 6.2500 mg | ORAL_TABLET | Freq: Two times a day (BID) | ORAL | 3 refills | Status: DC

## 2022-09-25 DIAGNOSIS — M48061 Spinal stenosis, lumbar region without neurogenic claudication: Secondary | ICD-10-CM | POA: Diagnosis not present

## 2022-09-25 DIAGNOSIS — Z6828 Body mass index (BMI) 28.0-28.9, adult: Secondary | ICD-10-CM | POA: Diagnosis not present

## 2022-09-28 ENCOUNTER — Other Ambulatory Visit: Payer: Self-pay | Admitting: Cardiovascular Disease

## 2022-10-02 DIAGNOSIS — M5416 Radiculopathy, lumbar region: Secondary | ICD-10-CM | POA: Diagnosis not present

## 2022-10-07 DIAGNOSIS — H52203 Unspecified astigmatism, bilateral: Secondary | ICD-10-CM | POA: Diagnosis not present

## 2022-10-07 DIAGNOSIS — Z961 Presence of intraocular lens: Secondary | ICD-10-CM | POA: Diagnosis not present

## 2022-10-07 DIAGNOSIS — H35371 Puckering of macula, right eye: Secondary | ICD-10-CM | POA: Diagnosis not present

## 2022-10-23 DIAGNOSIS — G959 Disease of spinal cord, unspecified: Secondary | ICD-10-CM | POA: Diagnosis not present

## 2022-10-23 DIAGNOSIS — M48061 Spinal stenosis, lumbar region without neurogenic claudication: Secondary | ICD-10-CM | POA: Diagnosis not present

## 2022-10-23 DIAGNOSIS — M419 Scoliosis, unspecified: Secondary | ICD-10-CM | POA: Diagnosis not present

## 2022-12-02 ENCOUNTER — Other Ambulatory Visit: Payer: Self-pay | Admitting: Cardiovascular Disease

## 2022-12-03 DIAGNOSIS — L570 Actinic keratosis: Secondary | ICD-10-CM | POA: Diagnosis not present

## 2022-12-03 DIAGNOSIS — D225 Melanocytic nevi of trunk: Secondary | ICD-10-CM | POA: Diagnosis not present

## 2022-12-03 DIAGNOSIS — L821 Other seborrheic keratosis: Secondary | ICD-10-CM | POA: Diagnosis not present

## 2022-12-03 DIAGNOSIS — L720 Epidermal cyst: Secondary | ICD-10-CM | POA: Diagnosis not present

## 2022-12-03 DIAGNOSIS — D692 Other nonthrombocytopenic purpura: Secondary | ICD-10-CM | POA: Diagnosis not present

## 2022-12-03 DIAGNOSIS — Z85828 Personal history of other malignant neoplasm of skin: Secondary | ICD-10-CM | POA: Diagnosis not present

## 2022-12-03 DIAGNOSIS — L905 Scar conditions and fibrosis of skin: Secondary | ICD-10-CM | POA: Diagnosis not present

## 2022-12-03 DIAGNOSIS — D2372 Other benign neoplasm of skin of left lower limb, including hip: Secondary | ICD-10-CM | POA: Diagnosis not present

## 2022-12-03 DIAGNOSIS — D1801 Hemangioma of skin and subcutaneous tissue: Secondary | ICD-10-CM | POA: Diagnosis not present

## 2022-12-03 DIAGNOSIS — Z85820 Personal history of malignant melanoma of skin: Secondary | ICD-10-CM | POA: Diagnosis not present

## 2023-01-02 DIAGNOSIS — M5416 Radiculopathy, lumbar region: Secondary | ICD-10-CM | POA: Diagnosis not present

## 2023-01-21 DIAGNOSIS — S0003XA Contusion of scalp, initial encounter: Secondary | ICD-10-CM | POA: Diagnosis not present

## 2023-01-21 DIAGNOSIS — W108XXA Fall (on) (from) other stairs and steps, initial encounter: Secondary | ICD-10-CM | POA: Diagnosis not present

## 2023-01-21 DIAGNOSIS — R945 Abnormal results of liver function studies: Secondary | ICD-10-CM | POA: Diagnosis not present

## 2023-01-21 DIAGNOSIS — I10 Essential (primary) hypertension: Secondary | ICD-10-CM | POA: Diagnosis not present

## 2023-01-21 DIAGNOSIS — Z23 Encounter for immunization: Secondary | ICD-10-CM | POA: Diagnosis not present

## 2023-01-21 DIAGNOSIS — M47812 Spondylosis without myelopathy or radiculopathy, cervical region: Secondary | ICD-10-CM | POA: Diagnosis not present

## 2023-01-21 DIAGNOSIS — R918 Other nonspecific abnormal finding of lung field: Secondary | ICD-10-CM | POA: Diagnosis not present

## 2023-01-21 DIAGNOSIS — S0990XA Unspecified injury of head, initial encounter: Secondary | ICD-10-CM | POA: Diagnosis not present

## 2023-01-21 DIAGNOSIS — R519 Headache, unspecified: Secondary | ICD-10-CM | POA: Diagnosis not present

## 2023-01-21 DIAGNOSIS — S0101XA Laceration without foreign body of scalp, initial encounter: Secondary | ICD-10-CM | POA: Diagnosis not present

## 2023-01-21 DIAGNOSIS — D649 Anemia, unspecified: Secondary | ICD-10-CM | POA: Diagnosis not present

## 2023-01-21 DIAGNOSIS — S0181XA Laceration without foreign body of other part of head, initial encounter: Secondary | ICD-10-CM | POA: Diagnosis not present

## 2023-01-21 DIAGNOSIS — S0993XA Unspecified injury of face, initial encounter: Secondary | ICD-10-CM | POA: Diagnosis not present

## 2023-01-21 DIAGNOSIS — W010XXA Fall on same level from slipping, tripping and stumbling without subsequent striking against object, initial encounter: Secondary | ICD-10-CM | POA: Diagnosis not present

## 2023-01-21 DIAGNOSIS — S2231XA Fracture of one rib, right side, initial encounter for closed fracture: Secondary | ICD-10-CM | POA: Diagnosis not present

## 2023-01-21 DIAGNOSIS — E871 Hypo-osmolality and hyponatremia: Secondary | ICD-10-CM | POA: Diagnosis not present

## 2023-01-21 DIAGNOSIS — T07XXXA Unspecified multiple injuries, initial encounter: Secondary | ICD-10-CM | POA: Diagnosis not present

## 2023-01-22 DIAGNOSIS — M5416 Radiculopathy, lumbar region: Secondary | ICD-10-CM | POA: Diagnosis not present

## 2023-01-29 DIAGNOSIS — S0101XD Laceration without foreign body of scalp, subsequent encounter: Secondary | ICD-10-CM | POA: Diagnosis not present

## 2023-01-29 DIAGNOSIS — Z4802 Encounter for removal of sutures: Secondary | ICD-10-CM | POA: Diagnosis not present

## 2023-02-11 DIAGNOSIS — H903 Sensorineural hearing loss, bilateral: Secondary | ICD-10-CM | POA: Diagnosis not present

## 2023-02-11 DIAGNOSIS — L738 Other specified follicular disorders: Secondary | ICD-10-CM | POA: Diagnosis not present

## 2023-02-11 DIAGNOSIS — Z85828 Personal history of other malignant neoplasm of skin: Secondary | ICD-10-CM | POA: Diagnosis not present

## 2023-02-11 DIAGNOSIS — R21 Rash and other nonspecific skin eruption: Secondary | ICD-10-CM | POA: Diagnosis not present

## 2023-02-11 DIAGNOSIS — R42 Dizziness and giddiness: Secondary | ICD-10-CM | POA: Diagnosis not present

## 2023-04-16 DIAGNOSIS — Z6829 Body mass index (BMI) 29.0-29.9, adult: Secondary | ICD-10-CM | POA: Diagnosis not present

## 2023-04-16 DIAGNOSIS — M5416 Radiculopathy, lumbar region: Secondary | ICD-10-CM | POA: Diagnosis not present

## 2023-05-05 DIAGNOSIS — M5416 Radiculopathy, lumbar region: Secondary | ICD-10-CM | POA: Diagnosis not present

## 2023-05-28 DIAGNOSIS — M5416 Radiculopathy, lumbar region: Secondary | ICD-10-CM | POA: Diagnosis not present

## 2023-06-01 ENCOUNTER — Other Ambulatory Visit: Payer: Self-pay | Admitting: Surgery

## 2023-06-01 DIAGNOSIS — R911 Solitary pulmonary nodule: Secondary | ICD-10-CM

## 2023-06-04 DIAGNOSIS — Z85820 Personal history of malignant melanoma of skin: Secondary | ICD-10-CM | POA: Diagnosis not present

## 2023-06-04 DIAGNOSIS — L570 Actinic keratosis: Secondary | ICD-10-CM | POA: Diagnosis not present

## 2023-06-04 DIAGNOSIS — D225 Melanocytic nevi of trunk: Secondary | ICD-10-CM | POA: Diagnosis not present

## 2023-06-04 DIAGNOSIS — L905 Scar conditions and fibrosis of skin: Secondary | ICD-10-CM | POA: Diagnosis not present

## 2023-06-04 DIAGNOSIS — Z85828 Personal history of other malignant neoplasm of skin: Secondary | ICD-10-CM | POA: Diagnosis not present

## 2023-06-04 DIAGNOSIS — L821 Other seborrheic keratosis: Secondary | ICD-10-CM | POA: Diagnosis not present

## 2023-06-08 DIAGNOSIS — R262 Difficulty in walking, not elsewhere classified: Secondary | ICD-10-CM | POA: Diagnosis not present

## 2023-06-08 DIAGNOSIS — M6281 Muscle weakness (generalized): Secondary | ICD-10-CM | POA: Diagnosis not present

## 2023-06-08 DIAGNOSIS — M79604 Pain in right leg: Secondary | ICD-10-CM | POA: Diagnosis not present

## 2023-06-08 DIAGNOSIS — M5459 Other low back pain: Secondary | ICD-10-CM | POA: Diagnosis not present

## 2023-07-01 ENCOUNTER — Other Ambulatory Visit: Payer: PPO

## 2023-07-01 ENCOUNTER — Ambulatory Visit
Admission: RE | Admit: 2023-07-01 | Discharge: 2023-07-01 | Disposition: A | Discharge status: Home / Self Care | Payer: PPO | Source: Ambulatory Visit | Attending: Surgery | Admitting: Surgery

## 2023-07-01 DIAGNOSIS — R262 Difficulty in walking, not elsewhere classified: Secondary | ICD-10-CM | POA: Diagnosis not present

## 2023-07-01 DIAGNOSIS — M5459 Other low back pain: Secondary | ICD-10-CM | POA: Diagnosis not present

## 2023-07-01 DIAGNOSIS — Z85118 Personal history of other malignant neoplasm of bronchus and lung: Secondary | ICD-10-CM | POA: Diagnosis not present

## 2023-07-01 DIAGNOSIS — R911 Solitary pulmonary nodule: Secondary | ICD-10-CM | POA: Diagnosis not present

## 2023-07-01 DIAGNOSIS — M6281 Muscle weakness (generalized): Secondary | ICD-10-CM | POA: Diagnosis not present

## 2023-07-01 DIAGNOSIS — M79604 Pain in right leg: Secondary | ICD-10-CM | POA: Diagnosis not present

## 2023-07-03 DIAGNOSIS — R262 Difficulty in walking, not elsewhere classified: Secondary | ICD-10-CM | POA: Diagnosis not present

## 2023-07-03 DIAGNOSIS — M6281 Muscle weakness (generalized): Secondary | ICD-10-CM | POA: Diagnosis not present

## 2023-07-03 DIAGNOSIS — M5459 Other low back pain: Secondary | ICD-10-CM | POA: Diagnosis not present

## 2023-07-03 DIAGNOSIS — M79604 Pain in right leg: Secondary | ICD-10-CM | POA: Diagnosis not present

## 2023-07-06 DIAGNOSIS — M79604 Pain in right leg: Secondary | ICD-10-CM | POA: Diagnosis not present

## 2023-07-06 DIAGNOSIS — R262 Difficulty in walking, not elsewhere classified: Secondary | ICD-10-CM | POA: Diagnosis not present

## 2023-07-06 DIAGNOSIS — M5459 Other low back pain: Secondary | ICD-10-CM | POA: Diagnosis not present

## 2023-07-06 DIAGNOSIS — M6281 Muscle weakness (generalized): Secondary | ICD-10-CM | POA: Diagnosis not present

## 2023-07-08 ENCOUNTER — Ambulatory Visit: Payer: PPO | Admitting: Surgery

## 2023-07-08 ENCOUNTER — Encounter: Payer: Self-pay | Admitting: Surgery

## 2023-07-08 VITALS — BP 158/77 | HR 60 | Resp 20 | Ht 67.0 in | Wt 180.0 lb

## 2023-07-08 DIAGNOSIS — M5459 Other low back pain: Secondary | ICD-10-CM | POA: Diagnosis not present

## 2023-07-08 DIAGNOSIS — M79604 Pain in right leg: Secondary | ICD-10-CM | POA: Diagnosis not present

## 2023-07-08 DIAGNOSIS — R911 Solitary pulmonary nodule: Secondary | ICD-10-CM | POA: Diagnosis not present

## 2023-07-08 DIAGNOSIS — R262 Difficulty in walking, not elsewhere classified: Secondary | ICD-10-CM | POA: Diagnosis not present

## 2023-07-08 DIAGNOSIS — M6281 Muscle weakness (generalized): Secondary | ICD-10-CM | POA: Diagnosis not present

## 2023-07-08 NOTE — Progress Notes (Signed)
HPI:  The patient is an 83 year old gentleman who returns for lung cancer surveillance s/p left upper lobectomy for a pT1b, pN0 stage IA well differentiated adenocarcinoma on 05/21/2015. He did not have a fissure between the upper and lower lobes and the stapled resection margin was positive. He was not a candidate for a completion pneumonectomy and received XRT to 66 gray in 33 fractions by Dr. Roselind Messier completed on 08/16/2015.  I have been following him ever since with yearly CT scans.  He has continued to do well overall.  He does report occasional wheezing.  He remains reasonably active and is now living part-time up in [place] as well as in [place].  He reports left ear hearing loss and some problems with balance related to that.  Current Outpatient Medications  Medication Sig Dispense Refill   Calcium Carb-Cholecalciferol (CALCIUM 600 + D PO) Take 600 mg by mouth daily.     carvedilol (COREG) 6.25 MG tablet Take 1 tablet (6.25 mg total) by mouth 2 (two) times daily. 180 tablet 3   famotidine (PEPCID) 20 MG tablet Take 1 tablet (20 mg total) by mouth at bedtime. 30 tablet 11   hydrochlorothiazide (MICROZIDE) 12.5 MG capsule TAKE ONE CAPSULE BY MOUTH DAILY 90 capsule 2   Multiple Vitamin (MULTIVITAMIN WITH MINERALS) TABS tablet Take 2 tablets by mouth daily.     potassium chloride SA (K-DUR,KLOR-CON) 20 MEQ tablet 20 mEq 2 (two) times daily.      rosuvastatin (CRESTOR) 10 MG tablet Take 10 mg by mouth daily. In AM     telmisartan (MICARDIS) 80 MG tablet Take 1 tablet (80 mg total) by mouth daily. 30 tablet 11   omeprazole (PRILOSEC) 20 MG capsule 1 capsule 30 minutes before morning meal (Patient not taking: Reported on 07/08/2023)     Current Facility-Administered Medications  Medication Dose Route Frequency Provider Last Rate Last Admin   0.9 %  sodium chloride infusion  500 mL Intravenous Continuous Armbruster, Willaim Rayas, MD         Physical Exam: BP (!) 158/77   Pulse 60    Resp 20   Ht 5\' 7"  (1.702 m)   Wt 180 lb (81.6 kg)   SpO2 99% Comment: RA  BMI 28.19 kg/m  He looks well. There is no cervical or supraclavicular adenopathy. Lungs are clear. Cardiac exam shows regular rate and rhythm with normal heart sounds.  There is no murmur.  Diagnostic Tests:  Narrative & Impression  CLINICAL DATA:  Lung nodule history of left-sided lung cancer. Melanoma. * Tracking Code: BO *   EXAM: CT CHEST WITHOUT CONTRAST   TECHNIQUE: Multidetector CT imaging of the chest was performed following the standard protocol without IV contrast.   RADIATION DOSE REDUCTION: This exam was performed according to the departmental dose-optimization program which includes automated exposure control, adjustment of the mA and/or kV according to patient size and/or use of iterative reconstruction technique.   COMPARISON:  06/26/2022 CT and older   FINDINGS: Cardiovascular: Stable trace pericardial fluid or thickening. Heart is nonenlarged. On this non IV contrast exam the thoracic aorta has a normal course and caliber with scattered calcified plaque. Coronary artery calcifications are seen.   Mediastinum/Nodes: No enlarged mediastinal or axillary lymph nodes. Thyroid gland, trachea, and esophagus demonstrate no significant findings.   Lungs/Pleura: Right lung has some dependent atelectasis or scarring in the lower lobe. No consolidation, pneumothorax or effusion. There is small juxtapleural middle lobe nodule on series  5, image 109 which is stable going back to at least 2020 demonstrating long-term stability at 3 mm. No additional follow-up. Once again surgical changes and posttreatment changes of the left lung apex with volume loss, distortion, bronchiectasis and pleural thickening. This also is not significantly changed from previous. No new left-sided dominant nodule. No consolidation, effusion or pneumothorax. Minimal ground-glass in the anteromedial left upper lung.  Also stable.   Upper Abdomen: Adrenal glands are preserved in the upper abdomen. Bilateral renal cystic lesions are again identified. Fatty liver infiltration.   Musculoskeletal: Scattered degenerative changes along the spine with slight curvature.   IMPRESSION: No significant interval change.   Stable postoperative changes of the left upper lung with consolidation, pleural thickening and distortion. No new mass lesion or nodal enlargement.   Aortic Atherosclerosis (ICD10-I70.0).     Electronically Signed   By: Karen Kays M.D.   On: 07/01/2023 16:57      Impression:  He continues to do well without evidence of recurrent or new lung lung cancer.  There are stable postoperative changes in the left upper lung related to his radiation therapy.  I reviewed the CT images with him and answered his questions.  I encouraged him to continue remaining as active as possible.  Plan:  I will plan to see him back in 1 year with a CT scan of the chest for lung cancer surveillance.  I spent 10 minutes performing this established patient evaluation and > 50% of this time was spent face to face counseling and coordinating the surveillance of his previously resected lung cancer.   Alleen Borne, MD [facility] [phone]

## 2023-07-13 DIAGNOSIS — M6281 Muscle weakness (generalized): Secondary | ICD-10-CM | POA: Diagnosis not present

## 2023-07-13 DIAGNOSIS — R262 Difficulty in walking, not elsewhere classified: Secondary | ICD-10-CM | POA: Diagnosis not present

## 2023-07-13 DIAGNOSIS — M5459 Other low back pain: Secondary | ICD-10-CM | POA: Diagnosis not present

## 2023-07-13 DIAGNOSIS — M79604 Pain in right leg: Secondary | ICD-10-CM | POA: Diagnosis not present

## 2023-07-15 DIAGNOSIS — M79604 Pain in right leg: Secondary | ICD-10-CM | POA: Diagnosis not present

## 2023-07-15 DIAGNOSIS — M6281 Muscle weakness (generalized): Secondary | ICD-10-CM | POA: Diagnosis not present

## 2023-07-15 DIAGNOSIS — M5459 Other low back pain: Secondary | ICD-10-CM | POA: Diagnosis not present

## 2023-07-15 DIAGNOSIS — R262 Difficulty in walking, not elsewhere classified: Secondary | ICD-10-CM | POA: Diagnosis not present

## 2023-07-20 ENCOUNTER — Emergency Department (HOSPITAL_BASED_OUTPATIENT_CLINIC_OR_DEPARTMENT_OTHER)
Admission: EM | Admit: 2023-07-20 | Discharge: 2023-07-20 | Disposition: A | Discharge status: Home / Self Care | Payer: PPO | Attending: Emergency Medicine | Admitting: Emergency Medicine

## 2023-07-20 ENCOUNTER — Encounter (HOSPITAL_BASED_OUTPATIENT_CLINIC_OR_DEPARTMENT_OTHER): Payer: Self-pay | Admitting: Emergency Medicine

## 2023-07-20 ENCOUNTER — Other Ambulatory Visit: Payer: Self-pay

## 2023-07-20 DIAGNOSIS — Z79899 Other long term (current) drug therapy: Secondary | ICD-10-CM | POA: Insufficient documentation

## 2023-07-20 DIAGNOSIS — M6281 Muscle weakness (generalized): Secondary | ICD-10-CM | POA: Diagnosis not present

## 2023-07-20 DIAGNOSIS — I10 Essential (primary) hypertension: Secondary | ICD-10-CM | POA: Insufficient documentation

## 2023-07-20 DIAGNOSIS — R262 Difficulty in walking, not elsewhere classified: Secondary | ICD-10-CM | POA: Diagnosis not present

## 2023-07-20 DIAGNOSIS — M5459 Other low back pain: Secondary | ICD-10-CM | POA: Diagnosis not present

## 2023-07-20 DIAGNOSIS — M79604 Pain in right leg: Secondary | ICD-10-CM | POA: Diagnosis not present

## 2023-07-20 LAB — CBC WITH DIFFERENTIAL/PLATELET
Abs Immature Granulocytes: 0.04 10*3/uL (ref 0.00–0.07)
Basophils Absolute: 0 10*3/uL (ref 0.0–0.1)
Basophils Relative: 0 %
Eosinophils Absolute: 0.1 10*3/uL (ref 0.0–0.5)
Eosinophils Relative: 2 %
HCT: 34 % — ABNORMAL LOW (ref 39.0–52.0)
Hemoglobin: 12 g/dL — ABNORMAL LOW (ref 13.0–17.0)
Immature Granulocytes: 1 %
Lymphocytes Relative: 18 %
Lymphs Abs: 1 10*3/uL (ref 0.7–4.0)
MCH: 35.2 pg — ABNORMAL HIGH (ref 26.0–34.0)
MCHC: 35.3 g/dL (ref 30.0–36.0)
MCV: 99.7 fL (ref 80.0–100.0)
Monocytes Absolute: 0.9 10*3/uL (ref 0.1–1.0)
Monocytes Relative: 16 %
Neutro Abs: 3.5 10*3/uL (ref 1.7–7.7)
Neutrophils Relative %: 63 %
Platelets: 142 10*3/uL — ABNORMAL LOW (ref 150–400)
RBC: 3.41 MIL/uL — ABNORMAL LOW (ref 4.22–5.81)
RDW: 12.7 % (ref 11.5–15.5)
WBC: 5.5 10*3/uL (ref 4.0–10.5)
nRBC: 0 % (ref 0.0–0.2)

## 2023-07-20 LAB — BASIC METABOLIC PANEL
Anion gap: 9 (ref 5–15)
BUN: 14 mg/dL (ref 8–23)
CO2: 25 mmol/L (ref 22–32)
Calcium: 9.7 mg/dL (ref 8.9–10.3)
Chloride: 99 mmol/L (ref 98–111)
Creatinine, Ser: 0.92 mg/dL (ref 0.61–1.24)
GFR, Estimated: >60 mL/min (ref >60)
Glucose, Bld: 86 mg/dL (ref 70–99)
Potassium: 4.3 mmol/L (ref 3.5–5.1)
Sodium: 133 mmol/L — ABNORMAL LOW (ref 135–145)

## 2023-07-20 MED ORDER — LABETALOL HCL 5 MG/ML IV SOLN
10.0000 mg | Freq: Once | INTRAVENOUS | Status: AC
  Administered 2023-07-20: 10 mg via INTRAVENOUS
  Filled 2023-07-20: qty 4

## 2023-07-20 NOTE — ED Provider Notes (Signed)
[facility] EMERGENCY DEPARTMENT AT [facility] Provider Note   CSN: [id number] Arrival date & time: 07/20/23  1748     History  Chief Complaint  Patient presents with   Hypertension    George Montoya. is a 83 y.o. male.  This is a 83 year old male here today for hypertension.  Patient says that he was doing physical therapy which has been doing to improve his balance, he checked his blood pressure and noticed it was elevated.  Patient says that he had not been taking his carvedilol the last couple of days because his PCP had not refilled the prescription.  Patient also takes an ARB.  No chest pain, no shortness of breath, no headache.   Hypertension       Home Medications Prior to Admission medications   Medication Sig Start Date End Date Taking? Authorizing Provider  Calcium Carb-Cholecalciferol (CALCIUM 600 + D PO) Take 600 mg by mouth daily.    [provider]  carvedilol (COREG) 6.25 MG tablet Take 1 tablet (6.25 mg total) by mouth 2 (two) times daily. 09/19/22   Croitoru, Mihai, MD  famotidine (PEPCID) 20 MG tablet Take 1 tablet (20 mg total) by mouth at bedtime. 05/04/19   Nyoka Cowden, MD  hydrochlorothiazide (MICROZIDE) 12.5 MG capsule TAKE ONE CAPSULE BY MOUTH DAILY 09/10/21   Croitoru, Mihai, MD  Multiple Vitamin (MULTIVITAMIN WITH MINERALS) TABS tablet Take 2 tablets by mouth daily.    [provider]  omeprazole (PRILOSEC) 20 MG capsule 1 capsule 30 minutes before morning meal Patient not taking: Reported on 07/08/2023 09/02/21   [provider]  potassium chloride SA (K-DUR,KLOR-CON) 20 MEQ tablet 20 mEq 2 (two) times daily.  04/22/17   [provider]  rosuvastatin (CRESTOR) 10 MG tablet Take 10 mg by mouth daily. In AM    [provider]  telmisartan (MICARDIS) 80 MG tablet Take 1 tablet (80 mg total) by mouth daily. 12/02/22   Croitoru, Mihai, MD      Allergies    Amlodipine and Hydrochlorothiazide  w-triamterene    Review of Systems   Review of Systems  Physical Exam Updated Vital Signs BP (!) 174/85   Pulse (!) 57   Temp 98.2 F (36.8 C)   Resp 13   Ht 5\' 7"  (1.702 m)   Wt 81.6 kg   SpO2 97%   BMI 28.18 kg/m  Physical Exam Vitals reviewed.  Cardiovascular:     Rate and Rhythm: Normal rate.     Pulses: Normal pulses.  Pulmonary:     Effort: Pulmonary effort is normal.  Neurological:     General: No focal deficit present.     Mental Status: He is alert.     Motor: No weakness.     ED Results / Procedures / Treatments   Labs (all labs ordered are listed, but only abnormal results are displayed) Labs Reviewed  BASIC METABOLIC PANEL - Abnormal; Notable for the following components:      Result Value   Sodium 133 (*)    All other components within normal limits  CBC WITH DIFFERENTIAL/PLATELET - Abnormal; Notable for the following components:   RBC 3.41 (*)    Hemoglobin 12.0 (*)    HCT 34.0 (*)    MCH 35.2 (*)    Platelets 142 (*)    All other components within normal limits    EKG EKG Interpretation Date/Time:  Monday July 20 2023 22:02:27 EST Ventricular Rate:  60 PR  Interval:  192 QRS Duration:  105 QT Interval:  425 QTC Calculation: 425 R Axis:   28  Text Interpretation: Sinus rhythm Low voltage, precordial leads Confirmed by Anders Simmonds [phone]) on 07/20/2023 10:42:06 PM  Radiology No results found.  Procedures Procedures    Medications Ordered in ED Medications  labetalol (NORMODYNE) injection 10 mg (10 mg Intravenous Given 07/20/23 2152)    ED Course/ Medical Decision Making/ A&P                                 Medical Decision Making 83 year old male here today for hypertension.  Plan-patient without any symptoms for suggest hypertensive urgency or emergency.  Will check basic labs and the patient, provide him with some labetalol.  I believe the patient's hypertension is related to rebound hypertension from not taking his  carvedilol for the last couple of days and just resuming it today.  Patient tells me that he is somewhat inconsistent with his medications, sometimes breaking up the pills into smaller pieces so that he has additional medications if his PCP is unable to refill it.  He states that he has been taking his ARB consistently.  Reassessment 10:40 PM-my dependent review the patient's EKG shows no ST segment depressions or elevations, no evidence of acute ischemia.  Patient with normal renal function.  Hemoglobin at baseline.  Blood pressure came down nicely with labetalol.  Will advise patient to take his medications regularly.  Will have him take another dose of his carvedilol this evening before bed, will have him call his PCP tomorrow to set up a follow-up appointment.  Amount and/or Complexity of Data Reviewed Labs: ordered.  Risk Prescription drug management.          Final Clinical Impression(s) / ED Diagnoses Final diagnoses:  Primary hypertension    Rx / DC Orders ED Discharge Orders     None         Arletha Pili, DO 07/20/23 2245

## 2023-07-20 NOTE — Discharge Instructions (Signed)
Make sure that you are taking your telmisartan each day.  You can also take your carvedilol, 6.25 mg 2 times per day as directed.  I would like you to make sure that you are taking your medications each day as prescribed, and recording your blood pressure once in the morning and once in the evening.  Please call your primary care doctor tomorrow to discuss your visit to the emergency room.  They may make some changes to your blood pressure medication, which likely would include increasing your carvedilol, but they may choose to add another agent.  Return to the emergency department you develop pain in your chest, headache, or trouble with your vision.

## 2023-07-20 NOTE — ED Triage Notes (Signed)
States was HTN during PT class today. Hx of Htn. Been without carvedilol x 3 days. Denies any other symptoms.

## 2023-07-21 ENCOUNTER — Telehealth: Payer: Self-pay | Admitting: Cardiovascular Disease

## 2023-07-21 NOTE — Telephone Encounter (Signed)
Attempted to call patient, no answer left message returning call

## 2023-07-21 NOTE — Telephone Encounter (Signed)
Pt c/o BP issue: STAT if pt c/o blurred vision, one-sided weakness or slurred speech  1. What are your last 5 BP readings? 190/100, 220/110,170/87,154/82  2. Are you having any other symptoms (ex. Dizziness, headache, blurred vision, passed out)? lightheaded  3. What is your BP issue? Pt went to ER yesterday because his BP was so high.

## 2023-07-21 NOTE — Telephone Encounter (Signed)
Spoke with patient. Patient verified name and DOB. Patient had his blood pressure checked at physical therapy yesterday 07/20/2023 (190/100).  Patient rechecked his blood pressure at home and it read 210/110 so he went to the ER. He had no symptoms of chest pain, shortness of breath, dizziness, or headache. [place] and did not take his carvedilol Friday 1/24 and Saturday 1/25. He restarted his carvedilol Sunday 1/26.  He was given a double dose of carvedilol in the ER and discharged home with no med changes. He was instructed to not stop his beta blocker and keep a daily log of his bp readings.  Pt blood pressure read 154/82 today and patient is asymptomatic. Pt has an appt with Dr. Royann Shivers on 09/09/2023 at 3pm. I advised pt to keep a log of BP for 2 weeks and send to our office for review, then continue to keep a BP log to bring to next appt. Patient verbalized understanding.   Josie LPN

## 2023-07-21 NOTE — Telephone Encounter (Signed)
Thank you.  It does sound like he had beta-blocker "rebound" (like a withdrawal syndrome).  He should never abruptly stop his beta-blocker.

## 2023-07-21 NOTE — Telephone Encounter (Signed)
Patient was returning phone call

## 2023-07-21 NOTE — Telephone Encounter (Signed)
Follow Up:     Patient is retuning a call from today.

## 2023-07-21 NOTE — Telephone Encounter (Signed)
Please see documentation in 07/21/23 Telephone encounter "Hypertension"

## 2023-07-21 NOTE — Telephone Encounter (Signed)
Left message for pt to call.

## 2023-07-22 DIAGNOSIS — R262 Difficulty in walking, not elsewhere classified: Secondary | ICD-10-CM | POA: Diagnosis not present

## 2023-07-22 DIAGNOSIS — M79604 Pain in right leg: Secondary | ICD-10-CM | POA: Diagnosis not present

## 2023-07-22 DIAGNOSIS — M6281 Muscle weakness (generalized): Secondary | ICD-10-CM | POA: Diagnosis not present

## 2023-07-22 DIAGNOSIS — M5459 Other low back pain: Secondary | ICD-10-CM | POA: Diagnosis not present

## 2023-07-27 DIAGNOSIS — R262 Difficulty in walking, not elsewhere classified: Secondary | ICD-10-CM | POA: Diagnosis not present

## 2023-07-27 DIAGNOSIS — M5459 Other low back pain: Secondary | ICD-10-CM | POA: Diagnosis not present

## 2023-07-27 DIAGNOSIS — M6281 Muscle weakness (generalized): Secondary | ICD-10-CM | POA: Diagnosis not present

## 2023-07-27 DIAGNOSIS — M79604 Pain in right leg: Secondary | ICD-10-CM | POA: Diagnosis not present

## 2023-07-28 DIAGNOSIS — Z125 Encounter for screening for malignant neoplasm of prostate: Secondary | ICD-10-CM | POA: Diagnosis not present

## 2023-07-28 DIAGNOSIS — I10 Essential (primary) hypertension: Secondary | ICD-10-CM | POA: Diagnosis not present

## 2023-07-28 DIAGNOSIS — E785 Hyperlipidemia, unspecified: Secondary | ICD-10-CM | POA: Diagnosis not present

## 2023-07-29 DIAGNOSIS — R262 Difficulty in walking, not elsewhere classified: Secondary | ICD-10-CM | POA: Diagnosis not present

## 2023-07-29 DIAGNOSIS — M5459 Other low back pain: Secondary | ICD-10-CM | POA: Diagnosis not present

## 2023-07-29 DIAGNOSIS — M6281 Muscle weakness (generalized): Secondary | ICD-10-CM | POA: Diagnosis not present

## 2023-07-29 DIAGNOSIS — M79604 Pain in right leg: Secondary | ICD-10-CM | POA: Diagnosis not present

## 2023-08-03 DIAGNOSIS — R262 Difficulty in walking, not elsewhere classified: Secondary | ICD-10-CM | POA: Diagnosis not present

## 2023-08-03 DIAGNOSIS — M6281 Muscle weakness (generalized): Secondary | ICD-10-CM | POA: Diagnosis not present

## 2023-08-03 DIAGNOSIS — M79604 Pain in right leg: Secondary | ICD-10-CM | POA: Diagnosis not present

## 2023-08-03 DIAGNOSIS — M5459 Other low back pain: Secondary | ICD-10-CM | POA: Diagnosis not present

## 2023-08-04 ENCOUNTER — Telehealth: Payer: Self-pay | Admitting: Cardiovascular Disease

## 2023-08-04 DIAGNOSIS — Z0000 Encounter for general adult medical examination without abnormal findings: Secondary | ICD-10-CM | POA: Diagnosis not present

## 2023-08-04 DIAGNOSIS — E785 Hyperlipidemia, unspecified: Secondary | ICD-10-CM | POA: Diagnosis not present

## 2023-08-04 DIAGNOSIS — D649 Anemia, unspecified: Secondary | ICD-10-CM | POA: Diagnosis not present

## 2023-08-04 DIAGNOSIS — M5416 Radiculopathy, lumbar region: Secondary | ICD-10-CM | POA: Diagnosis not present

## 2023-08-04 DIAGNOSIS — I10 Essential (primary) hypertension: Secondary | ICD-10-CM | POA: Diagnosis not present

## 2023-08-04 DIAGNOSIS — J449 Chronic obstructive pulmonary disease, unspecified: Secondary | ICD-10-CM | POA: Diagnosis not present

## 2023-08-04 DIAGNOSIS — I77810 Thoracic aortic ectasia: Secondary | ICD-10-CM | POA: Diagnosis not present

## 2023-08-04 DIAGNOSIS — I2510 Atherosclerotic heart disease of native coronary artery without angina pectoris: Secondary | ICD-10-CM | POA: Diagnosis not present

## 2023-08-04 NOTE — Telephone Encounter (Signed)
Patient drop bp report will be in provider box

## 2023-08-10 ENCOUNTER — Other Ambulatory Visit: Payer: Self-pay | Admitting: Cardiovascular Disease

## 2023-08-10 ENCOUNTER — Telehealth: Payer: Self-pay

## 2023-08-10 ENCOUNTER — Other Ambulatory Visit: Payer: Self-pay

## 2023-08-10 MED ORDER — CARVEDILOL 6.25 MG PO TABS: 12.5000 mg | ORAL_TABLET | Freq: Two times a day (BID) | ORAL | 3 refills | Status: DC

## 2023-08-10 NOTE — Telephone Encounter (Signed)
Called patient and let him know that Dr Marney Setting has reviewed the blood pressure log that he submitted to the office and wants patient to increase his carvedilol 6.125 mg to 12.5 mg bid. He can take 2 tablets twice per day. Patient has asked that I send in new script and let them know that he wants the medicine delivered to his home. I confirmed pharmacy with him and will make the request on his behalf. Patient voiced understanding.

## 2023-08-12 ENCOUNTER — Telehealth (INDEPENDENT_AMBULATORY_CARE_PROVIDER_SITE_OTHER): Payer: Self-pay | Admitting: Otolaryngology

## 2023-08-12 NOTE — Telephone Encounter (Signed)
LVM to confirm appt & location [phone] afm

## 2023-08-13 ENCOUNTER — Encounter (INDEPENDENT_AMBULATORY_CARE_PROVIDER_SITE_OTHER): Payer: Self-pay

## 2023-08-13 ENCOUNTER — Ambulatory Visit (INDEPENDENT_AMBULATORY_CARE_PROVIDER_SITE_OTHER): Payer: PPO

## 2023-08-13 VITALS — BP 130/78 | Ht 67.5 in | Wt 180.0 lb

## 2023-08-13 DIAGNOSIS — H6122 Impacted cerumen, left ear: Secondary | ICD-10-CM

## 2023-08-13 DIAGNOSIS — R42 Dizziness and giddiness: Secondary | ICD-10-CM

## 2023-08-13 DIAGNOSIS — H8102 Meniere's disease, left ear: Secondary | ICD-10-CM

## 2023-08-13 DIAGNOSIS — H903 Sensorineural hearing loss, bilateral: Secondary | ICD-10-CM | POA: Diagnosis not present

## 2023-08-14 DIAGNOSIS — R42 Dizziness and giddiness: Secondary | ICD-10-CM | POA: Insufficient documentation

## 2023-08-14 DIAGNOSIS — H6122 Impacted cerumen, left ear: Secondary | ICD-10-CM | POA: Insufficient documentation

## 2023-08-14 DIAGNOSIS — H903 Sensorineural hearing loss, bilateral: Secondary | ICD-10-CM | POA: Insufficient documentation

## 2023-08-14 NOTE — Progress Notes (Signed)
Patient ID: George Stanford., male   DOB: 04/06/1941, 83 y.o.   MRN: [id number]  Follow-up: Left ear Mnire's disease, dizziness, hearing loss  HPI: The patient is an 83 year old male who returns today for his follow-up evaluation. The patient has a history of recurrent dizziness and left ear Mnire's disease, with complete hearing loss on the left side.  He also has a history of right high-frequency sensorineural hearing loss.  He currently wears [employer] hearing aids.  According to the patient, he has not noted any significant change in his hearing.  He has occasional mild dizziness.  He describes the dizziness as a lightheaded sensation. Due to his previous hyponatremia, he is no longer on a low-salt diet.  His cardiologist recently doubled his carvedilol dosage.  Currently he denies any otalgia, otorrhea, or vertigo.  Exam: General: Communicates with some difficulty due to his hearing loss, well nourished, no acute distress. Head: Normocephalic, no evidence injury, no tenderness, facial buttresses intact without stepoff. Eyes: PERRL, EOMI. No scleral icterus, conjunctivae clear. Neuro: CN II exam reveals vision grossly intact. No nystagmus at any point of gaze. Ears: Left ear cerumen impaction.  The right ear canal and tympanic membrane are normal.  Nose: External evaluation reveals normal support and skin without lesions. Dorsum is intact. Anterior rhinoscopy reveals pink mucosa over anterior aspect of inferior turbinates and intact septum. No purulence noted. Oral:  Oral cavity and oropharynx are intact, symmetric, without erythema or edema. Mucosa is moist without lesions. Neck: Full range of motion without pain. There is no significant lymphadenopathy. No masses palpable. Thyroid bed within normal limits to palpation. Parotid glands and submandibular glands equal bilaterally without mass. Trachea is midline. Neuro:  CN 2-12 grossly intact. Gait wide-based. Vestibular: No nystagmus at any point of  gaze. The cerebellar examination is unremarkable.   Procedure: Left ear cerumen disimpaction Anesthesia: None Description: Under the operating microscope, the cerumen is carefully removed with a combination of cerumen currette, alligator forceps, and suction catheters.  After the cerumen is removed, the TMs are noted to be normal.  No mass, erythema, or lesions. The patient tolerated the procedure well.    Assessment  1.  Left ear cerumen impaction.  After the cerumen removal procedure, the patient's ear canals, tympanic membranes, and middle ear spaces are noted to be all normal. No middle ear effusion is noted.  2.  Subjectively stable right ear sensorineural hearing loss with complete loss on the left.  3.  Stable left ear Mnire's disease.  Previous hyponatremia.  He is no longer on a low-salt diet.  Plan: 1.  Otomicroscopy with left ear cerumen disimpaction. 2.  The physical exam findings are reviewed with the patient.  3.  The patient should continue the use of his hearing aids.  4.  The patient will return for re-evaluation in 6 months, sooner if needed.

## 2023-08-17 DIAGNOSIS — M79604 Pain in right leg: Secondary | ICD-10-CM | POA: Diagnosis not present

## 2023-08-17 DIAGNOSIS — R262 Difficulty in walking, not elsewhere classified: Secondary | ICD-10-CM | POA: Diagnosis not present

## 2023-08-17 DIAGNOSIS — M6281 Muscle weakness (generalized): Secondary | ICD-10-CM | POA: Diagnosis not present

## 2023-08-17 DIAGNOSIS — M5459 Other low back pain: Secondary | ICD-10-CM | POA: Diagnosis not present

## 2023-08-18 DIAGNOSIS — I10 Essential (primary) hypertension: Secondary | ICD-10-CM | POA: Diagnosis not present

## 2023-08-18 DIAGNOSIS — L309 Dermatitis, unspecified: Secondary | ICD-10-CM | POA: Diagnosis not present

## 2023-08-18 DIAGNOSIS — Z85828 Personal history of other malignant neoplasm of skin: Secondary | ICD-10-CM | POA: Diagnosis not present

## 2023-08-18 DIAGNOSIS — L111 Transient acantholytic dermatosis [Grover]: Secondary | ICD-10-CM | POA: Diagnosis not present

## 2023-08-18 DIAGNOSIS — R82998 Other abnormal findings in urine: Secondary | ICD-10-CM | POA: Diagnosis not present

## 2023-08-19 DIAGNOSIS — R262 Difficulty in walking, not elsewhere classified: Secondary | ICD-10-CM | POA: Diagnosis not present

## 2023-08-19 DIAGNOSIS — M79604 Pain in right leg: Secondary | ICD-10-CM | POA: Diagnosis not present

## 2023-08-19 DIAGNOSIS — M6281 Muscle weakness (generalized): Secondary | ICD-10-CM | POA: Diagnosis not present

## 2023-08-19 DIAGNOSIS — M5459 Other low back pain: Secondary | ICD-10-CM | POA: Diagnosis not present

## 2023-08-24 DIAGNOSIS — R262 Difficulty in walking, not elsewhere classified: Secondary | ICD-10-CM | POA: Diagnosis not present

## 2023-08-24 DIAGNOSIS — M79604 Pain in right leg: Secondary | ICD-10-CM | POA: Diagnosis not present

## 2023-08-24 DIAGNOSIS — M5459 Other low back pain: Secondary | ICD-10-CM | POA: Diagnosis not present

## 2023-08-24 DIAGNOSIS — M6281 Muscle weakness (generalized): Secondary | ICD-10-CM | POA: Diagnosis not present

## 2023-08-26 DIAGNOSIS — M5459 Other low back pain: Secondary | ICD-10-CM | POA: Diagnosis not present

## 2023-08-26 DIAGNOSIS — M6281 Muscle weakness (generalized): Secondary | ICD-10-CM | POA: Diagnosis not present

## 2023-08-26 DIAGNOSIS — M79604 Pain in right leg: Secondary | ICD-10-CM | POA: Diagnosis not present

## 2023-08-26 DIAGNOSIS — R262 Difficulty in walking, not elsewhere classified: Secondary | ICD-10-CM | POA: Diagnosis not present

## 2023-08-27 ENCOUNTER — Encounter: Payer: Self-pay | Admitting: Primary Care

## 2023-08-31 DIAGNOSIS — R262 Difficulty in walking, not elsewhere classified: Secondary | ICD-10-CM | POA: Diagnosis not present

## 2023-08-31 DIAGNOSIS — M5459 Other low back pain: Secondary | ICD-10-CM | POA: Diagnosis not present

## 2023-08-31 DIAGNOSIS — M6281 Muscle weakness (generalized): Secondary | ICD-10-CM | POA: Diagnosis not present

## 2023-08-31 DIAGNOSIS — M79604 Pain in right leg: Secondary | ICD-10-CM | POA: Diagnosis not present

## 2023-09-02 DIAGNOSIS — M5416 Radiculopathy, lumbar region: Secondary | ICD-10-CM | POA: Diagnosis not present

## 2023-09-07 DIAGNOSIS — R262 Difficulty in walking, not elsewhere classified: Secondary | ICD-10-CM | POA: Diagnosis not present

## 2023-09-07 DIAGNOSIS — M6281 Muscle weakness (generalized): Secondary | ICD-10-CM | POA: Diagnosis not present

## 2023-09-07 DIAGNOSIS — M5459 Other low back pain: Secondary | ICD-10-CM | POA: Diagnosis not present

## 2023-09-07 DIAGNOSIS — M79604 Pain in right leg: Secondary | ICD-10-CM | POA: Diagnosis not present

## 2023-09-09 ENCOUNTER — Ambulatory Visit: Payer: PPO | Attending: Cardiovascular Disease | Admitting: Cardiovascular Disease

## 2023-09-09 ENCOUNTER — Encounter: Payer: Self-pay | Admitting: Cardiovascular Disease

## 2023-09-09 VITALS — BP 122/68 | HR 67 | Ht 67.5 in | Wt 179.4 lb

## 2023-09-09 DIAGNOSIS — Z85118 Personal history of other malignant neoplasm of bronchus and lung: Secondary | ICD-10-CM

## 2023-09-09 DIAGNOSIS — I77810 Thoracic aortic ectasia: Secondary | ICD-10-CM | POA: Diagnosis not present

## 2023-09-09 DIAGNOSIS — Z923 Personal history of irradiation: Secondary | ICD-10-CM

## 2023-09-09 DIAGNOSIS — J3800 Paralysis of vocal cords and larynx, unspecified: Secondary | ICD-10-CM | POA: Diagnosis not present

## 2023-09-09 DIAGNOSIS — M6281 Muscle weakness (generalized): Secondary | ICD-10-CM | POA: Diagnosis not present

## 2023-09-09 DIAGNOSIS — M5459 Other low back pain: Secondary | ICD-10-CM | POA: Diagnosis not present

## 2023-09-09 DIAGNOSIS — J449 Chronic obstructive pulmonary disease, unspecified: Secondary | ICD-10-CM | POA: Diagnosis not present

## 2023-09-09 DIAGNOSIS — R0609 Other forms of dyspnea: Secondary | ICD-10-CM | POA: Diagnosis not present

## 2023-09-09 DIAGNOSIS — I10 Essential (primary) hypertension: Secondary | ICD-10-CM | POA: Diagnosis not present

## 2023-09-09 DIAGNOSIS — M79604 Pain in right leg: Secondary | ICD-10-CM | POA: Diagnosis not present

## 2023-09-09 DIAGNOSIS — I700 Atherosclerosis of aorta: Secondary | ICD-10-CM

## 2023-09-09 DIAGNOSIS — Z87898 Personal history of other specified conditions: Secondary | ICD-10-CM | POA: Diagnosis not present

## 2023-09-09 DIAGNOSIS — E7800 Pure hypercholesterolemia, unspecified: Secondary | ICD-10-CM

## 2023-09-09 DIAGNOSIS — R262 Difficulty in walking, not elsewhere classified: Secondary | ICD-10-CM | POA: Diagnosis not present

## 2023-09-09 MED ORDER — CARVEDILOL 12.5 MG PO TABS: 12.5000 mg | ORAL_TABLET | Freq: Two times a day (BID) | ORAL | 3 refills | Status: DC

## 2023-09-09 NOTE — Patient Instructions (Signed)
 Medication Instructions:  Your physician recommends that you continue on your current medications as directed. Please refer to the Current Medication list given to you today.  *If you need a refill on your cardiac medications before your next appointment, please call your pharmacy*  Testing/Procedures: Echocardiogram Your physician has requested that you have an echocardiogram. Echocardiography is a painless test that uses sound waves to create images of your heart. It provides your doctor with information about the size and shape of your heart and how well your heart's chambers and valves are working. This procedure takes approximately one hour. There are no restrictions for this procedure. Please do NOT wear cologne, perfume, aftershave, or lotions (deodorant is allowed). Please arrive 15 minutes prior to your appointment time.  Please note: We ask at that you not bring children with you during ultrasound (echo/ vascular) testing. Due to room size and safety concerns, children are not allowed in the ultrasound rooms during exams. Our front office staff cannot provide observation of children in our lobby area while testing is being conducted. An adult accompanying a patient to their appointment will only be allowed in the ultrasound room at the discretion of the ultrasound technician under special circumstances. We apologize for any inconvenience.  Follow-Up: [facility], you and your health needs are our priority.  As part of our continuing mission to provide you with exceptional heart care, we have created designated Provider Care Teams.  These Care Teams include your primary Cardiologist (physician) and Advanced Practice Providers (APPs -  Physician Assistants and Nurse Practitioners) who all work together to provide you with the care you need, when you need it.  Your next appointment:   1 year  Provider:   Thurmon Fair, MD       1st Floor: - Lobby - Registration  -  Pharmacy  - Lab - Cafe  2nd Floor: - PV Lab - Diagnostic Testing (echo, CT, nuclear med)  3rd Floor: - Vacant  4th Floor: - TCTS (cardiothoracic surgery) - AFib Clinic - Structural Heart Clinic - Vascular Surgery  - Vascular Ultrasound  5th Floor: - [facility] (general and EP) - Clinical Pharmacy for coumadin, hypertension, lipid, weight-loss medications, and med management appointments    Valet parking services will be available as well.

## 2023-09-09 NOTE — Progress Notes (Signed)
 Cardiology Office Note:    Date:  09/13/2023   ID:  George Montoya., DOB 02/03/1941, MRN [id number]  PCP:  Garlan Fillers, MD   [facility] Providers Cardiologist:  Thurmon Fair, MD     Referring MD: Garlan Fillers, MD   Chief Complaint  Patient presents with   Hypertension    History of Present Illness:    George Montoya. is a 83 y.o. male with a hx of syncope with a pattern suggestive of orthostatic hypotension/vasovagal syncope, Mnire's disease, essential hypertension, hypercholesterolemia, aortic atherosclerosis, borderline dilation of the thoracic aorta, lung cancer status left upper lobe resection and radiation therapy, complicated by recurrent left laryngeal nerve palsy.  Has not had syncope and only describes rare episodes of orthostatic lightheadedness, as long as he is careful to get up gradually.  Has not had any falls.  Denies chest pain or shortness of breath at rest, but he believes that his exertional dyspnea has worsened.  He has chronic hoarseness due to left recurrent laryngeal nerve injury, that varies in severity depending on how emotional he is.  Has not had problems with edema, claudication or new focal neurological complaints.  His hearing has deteriorated, but his problems with Mnire's disease related vertigo have settled down.    He gets yearly CT of the chest for surveillance of his lung cancer.  We have been using those images to measure his ascending aorta, even though the radiologist does not comment on aortic dilation.  Just like last year, my measurement is a maximum diameter of 4.1 cm.  Once again reported is aortic and coronary atherosclerosis.  Over the years, maximum recorded ascending aorta diameter has been 4.2 cm.   He has now had 3 syncopal events.  In 2015 at work he had an episode of syncope that was probably associated with hypotension due to an acute diarrheal illness.  It was associated with some prodromal symptoms.   However he has had syncope twice this year in March and in April.  The episodes were similar.  They both occurred when he jumped out of bed to use the restroom at around 2 or 3:00 in the morning.  He does not recall dizziness, lightheadedness, diaphoresis, nausea, flushing or any of the typical prodromal symptoms of a vasovagal event.  On both occasions he had head impact, had bleeding lacerations and required stitches, thankfully no intracranial bleeding.  Neither episode was associated with tonic-clonic movements, incontinence or postictal state.  The first time he was in his daughter's home and thinks he was simply unfamiliar with the surroundings and hit an obstacle.  The second syncopal event however happened in his home.  Echocardiogram in 2015 was essentially a normal study.  He had a normal nuclear stress test in 2016.   He wore an event monitor in June 2019 with completely normal results.  He has mild ectasia of the ascending thoracic aorta measured at 4 cm by CT, where he also had evidence of coronary and aortic atherosclerosis.  He has follow-up chest CT scans every 6-12 months and his aorta has not been described as dilated on the most recent studies.  He has previous undergone a left upper lobectomy for a stage I well-differentiated adenocarcinoma of the lung in 2016, followed by XRT. He has chronic XRT related changes in the left lower lobe.   Past Medical History:  Diagnosis Date   Aortic aneurysm (HCC)    DR Leafy Kindle WATCHING  Arthritis    spine   Bowel obstruction (HCC)    GERD (gastroesophageal reflux disease)    Headache    hx of 2 migraines as a college roommates   High cholesterol    Hypertension    Melanoma (HCC)    also basal cell carcinomas   Meniere's disease of left ear    Radiation 07/03/2015-08/16/2015   Left Mid Chest, surgical clips 66 gray   Scoliosis deformity of spine    Shingles    04/2014     RIGHT EYE, SCALP   Shingles    right eye, forehead and  head   Shortness of breath dyspnea    WITH EXERTION    Sleep related leg cramps    Syncopal episodes    x 1 in October, 2015.    Past Surgical History:  Procedure Laterality Date   APPENDECTOMY     CATARACT EXTRACTION W/ INTRAOCULAR LENS  IMPLANT, BILATERAL     COLON SURGERY     6 YRS AGO  "SNIPPED BAND BINDING INTESTINE"   COLONOSCOPY     HERNIA REPAIR     MELANOMA EXCISION     pilonadal cyst removed     surgery for bowel obstruction     THORACOTOMY/LOBECTOMY Left 05/21/2015   Procedure: LEFT THORACOTOMY/LEFT UPPER LOBECTOMY;  Surgeon: Alleen Borne, MD;  Location: [facility] OR;  Service: Thoracic;  Laterality: Left;   TONSILLECTOMY     VASECTOMY     VIDEO BRONCHOSCOPY WITH ENDOBRONCHIAL NAVIGATION N/A 10/18/2014   Procedure: VIDEO BRONCHOSCOPY WITH ENDOBRONCHIAL NAVIGATION;  Surgeon: Leslye Peer, MD;  Location: [facility] OR;  Service: Thoracic;  Laterality: N/A;    Current Medications: Current Meds  Medication Sig   albuterol (VENTOLIN HFA) 108 (90 Base) MCG/ACT inhaler Inhale 1-2 puffs into the lungs as needed for shortness of breath or wheezing.   Calcium Carb-Cholecalciferol (CALCIUM 600 + D PO) Take 600 mg by mouth daily.   carvedilol (COREG) 12.5 MG tablet Take 1 tablet (12.5 mg total) by mouth 2 (two) times daily.   famotidine (PEPCID) 20 MG tablet Take 1 tablet (20 mg total) by mouth at bedtime.   hydrochlorothiazide (MICROZIDE) 12.5 MG capsule TAKE ONE CAPSULE BY MOUTH DAILY   Multiple Vitamin (MULTIVITAMIN WITH MINERALS) TABS tablet Take 2 tablets by mouth daily.   omeprazole (PRILOSEC) 20 MG capsule    potassium chloride SA (K-DUR,KLOR-CON) 20 MEQ tablet 20 mEq 2 (two) times daily.    rosuvastatin (CRESTOR) 5 MG tablet Take 5 mg by mouth daily.   sildenafil (REVATIO) 20 MG tablet Take 20 mg by mouth as needed. Take 1-5 tablets as needed   telmisartan (MICARDIS) 80 MG tablet Take 1 tablet (80 mg total) by mouth daily.   [DISCONTINUED] carvedilol (COREG) 6.25 MG tablet Take 2  tablets (12.5 mg total) by mouth 2 (two) times daily.   Current Facility-Administered Medications for the 09/09/23 encounter (Office Visit) with Yoona Ishii, MD  Medication   0.9 %  sodium chloride infusion     Allergies:   Amlodipine and Hydrochlorothiazide w-triamterene   Social History   Socioeconomic History   Marital status: Married    Spouse name: Not on file   Number of children: Not on file   Years of education: Not on file   Highest education level: Not on file  Occupational History   Occupation: executive  Tobacco Use   Smoking status: Former    Current packs/day: 0.00    Average packs/day: 2.0 packs/day for 25.0  years (50.0 ttl pk-yrs)    Types: Cigarettes    Start date: 10/21/1956    Quit date: 10/21/1981    Years since quitting: 41.9   Smokeless tobacco: Never  Substance and Sexual Activity   Alcohol use: Yes    Alcohol/week: 20.0 standard drinks of alcohol    Types: 20 Standard drinks or equivalent per week    Comment: occ   Drug use: No   Sexual activity: Not on file  Other Topics Concern   Not on file  Social History Narrative   Not on file   Social Drivers of Health   Financial Resource Strain: Not on file  Food Insecurity: Not on file  Transportation Needs: Not on file  Physical Activity: Not on file  Stress: Not on file  Social Connections: Not on file     Family History: The patient's family history includes Breast cancer in his mother; Prostate cancer in his father.  ROS:   Please see the history of present illness.     All other systems reviewed and are negative.  EKGs/Labs/Other Studies Reviewed:    The following studies were reviewed today: Chest CT 06/13/2020  EKG: Personally reviewed the most recent tracing from 07/20/2023 which shows normal sinus rhythm, normal tracing  EKG Interpretation Date/Time:    Ventricular Rate:    PR Interval:    QRS Duration:    QT Interval:    QTC Calculation:   R Axis:      Text  Interpretation:           Recent Labs: 07/20/2023: BUN 14; Creatinine, Ser 0.92; Hemoglobin 12.0; Platelets 142; Potassium 4.3; Sodium 133  Recent Lipid Panel No results found for: "CHOL", "TRIG", "HDL", "CHOLHDL", "VLDL", "LDLCALC", "LDLDIRECT" 04/24/2020 Cholesterol 170, HDL 91, LDL 59, triglycerides 99  57/84/6962 Cholesterol 167, HDL 80, LDL 61, triglycerides 129 Potassium 4.5, ALT 61, creatinine 0.9, TSH 1.140  07/28/2023 Cholesterol 156, HDL 84, LDL 48, triglycerides 121       Physical Exam:    VS:  BP 122/68 (BP Location: Left Arm, Patient Position: Sitting, Cuff Size: Normal)   Pulse 67   Ht 5' 7.5" (1.715 m)   Wt 179 lb 6.4 oz (81.4 kg)   SpO2 98%   BMI 27.68 kg/m     Wt Readings from Last 3 Encounters:  09/09/23 179 lb 6.4 oz (81.4 kg)  08/13/23 180 lb (81.6 kg)  07/20/23 179 lb 14.3 oz (81.6 kg)     General: Alert, oriented x3, no distress, appears well Head: no evidence of trauma, PERRL, EOMI, no exophtalmos or lid lag, no myxedema, no xanthelasma; normal ears, nose and oropharynx Neck: normal jugular venous pulsations and no hepatojugular reflux; brisk carotid pulses without delay and no carotid bruits Chest: He has increased dullness to percussion in the left base consistent with an elevated hemidiaphragm (probably volume loss following left upper lobectomy, but with static images cannot exclude that he may also have phrenic nerve palsy).  Otherwise clear lungs. Cardiovascular: normal position and quality of the apical impulse, regular rhythm, normal first and second heart sounds, no murmurs, rubs or gallops Abdomen: no tenderness or distention, no masses by palpation, no abnormal pulsatility or arterial bruits, normal bowel sounds, no hepatosplenomegaly Extremities: no clubbing, cyanosis or edema; 2+ radial, ulnar and brachial pulses bilaterally; 2+ right femoral, posterior tibial and dorsalis pedis pulses; 2+ left femoral, posterior tibial and dorsalis  pedis pulses; no subclavian or femoral bruits Neurological: grossly nonfocal Psych: Normal mood and  affect   ASSESSMENT:    1. History of syncope   2. Essential hypertension   3. Ascending aorta dilatation (HCC)   4. Atherosclerosis of aorta (HCC)   5. Pure hypercholesterolemia   6. COPD GOLD 0   7. History of cancer of upper lobe bronchus or lung   8. History of external beam radiation therapy   9. Recurrent laryngeal nerve palsy   10. DOE (dyspnea on exertion)      PLAN:    In order of problems listed above:  Syncope: He has had 3 events in the past 10 years, but none in the last roughly 3 years.  The pattern has been very suggestive of orthostatic hypotension and/or vasovagal syncope.  Avoid "perfect" blood pressure control.  Target 140/90.  Stay well-hydrated, avoid diuretics.  Change position gradually, as he is doing. HTN:   Continue telmisartan and carvedilol, and if he develops symptoms of hypotension stop the diuretic. Aortic/coronary atherosclerosis: He has never had angina pectoris.  Low risk nuclear stress test in 2016.  Focus on risk factor modification. Mild dilation of the ascending aorta: There has been no change in the aorta diameter over the last several years.  He gets monitoring with CT of the chest for his lung cancer, we can measure the aorta on those studies. HLP: Excellent lipid parameters, continue rosuvastatin at the current dose of 5 mg daily. COPD/lung cancer/left recurrent laryngeal nerve palsy: He follows up with Dr. Laneta Simmers.  No evidence of disease.   Dyspnea: It is tempting to blame this entirely on his lung problems, but pulmonary function tests performed in 2021, roughly 5 years after his lobectomy showed very good FVC and FEV1 at approximately 90% of predicted.  No evidence of recurrent neoplasm status post surgery and XRT.  His physical exam and chest x-ray do suggest possible left phrenic nerve palsy, but it is impossible to confirm this diagnosis  with static images only.  Does not have a lot of problems with shortness of breath.  Tolerates carvedilol without worsening wheezing.  Quit smoking over 20 years ago.  I think we should perform an echocardiogram to make sure there is not a reduction in LV systolic or diastolic function.  He has not had a recent BNP but back in 2020 his BNP was normal at 66. If echo is normal consider repeating pulmonary function tests and performing fluoroscopy of his left hemidiaphragm.        Medication Adjustments/Labs and Tests Ordered: Current medicines are reviewed at length with the patient today.  Concerns regarding medicines are outlined above.  Orders Placed This Encounter  Procedures   ECHOCARDIOGRAM COMPLETE   Meds ordered this encounter  Medications   carvedilol (COREG) 12.5 MG tablet    Sig: Take 1 tablet (12.5 mg total) by mouth 2 (two) times daily.    Dispense:  180 tablet    Refill:  3     Patient Instructions  Medication Instructions:  Your physician recommends that you continue on your current medications as directed. Please refer to the Current Medication list given to you today.  *If you need a refill on your cardiac medications before your next appointment, please call your pharmacy*  Testing/Procedures: Echocardiogram Your physician has requested that you have an echocardiogram. Echocardiography is a painless test that uses sound waves to create images of your heart. It provides your doctor with information about the size and shape of your heart and how well your heart's chambers and valves are working.  This procedure takes approximately one hour. There are no restrictions for this procedure. Please do NOT wear cologne, perfume, aftershave, or lotions (deodorant is allowed). Please arrive 15 minutes prior to your appointment time.  Please note: We ask at that you not bring children with you during ultrasound (echo/ vascular) testing. Due to room size and safety concerns,  children are not allowed in the ultrasound rooms during exams. Our front office staff cannot provide observation of children in our lobby area while testing is being conducted. An adult accompanying a patient to their appointment will only be allowed in the ultrasound room at the discretion of the ultrasound technician under special circumstances. We apologize for any inconvenience.  Follow-Up: At [facility], you and your health needs are our priority.  As part of our continuing mission to provide you with exceptional heart care, we have created designated Provider Care Teams.  These Care Teams include your primary Cardiologist (physician) and Advanced Practice Providers (APPs -  Physician Assistants and Nurse Practitioners) who all work together to provide you with the care you need, when you need it.  Your next appointment:   1 year  Provider:   Thurmon Fair, MD       1st Floor: - Lobby - Registration  - Pharmacy  - Lab - Cafe  2nd Floor: - PV Lab - Diagnostic Testing (echo, CT, nuclear med)  3rd Floor: - Vacant  4th Floor: - TCTS (cardiothoracic surgery) - AFib Clinic - Structural Heart Clinic - Vascular Surgery  - Vascular Ultrasound  5th Floor: - HeartCare Cardiology (general and EP) - Clinical Pharmacy for coumadin, hypertension, lipid, weight-loss medications, and med management appointments    Valet parking services will be available as well.     Signed, Thurmon Fair, MD  09/13/2023 5:43 PM    [facility]

## 2023-09-13 ENCOUNTER — Encounter: Payer: Self-pay | Admitting: Cardiovascular Disease

## 2023-09-14 DIAGNOSIS — M5459 Other low back pain: Secondary | ICD-10-CM | POA: Diagnosis not present

## 2023-09-14 DIAGNOSIS — M6281 Muscle weakness (generalized): Secondary | ICD-10-CM | POA: Diagnosis not present

## 2023-09-14 DIAGNOSIS — M79604 Pain in right leg: Secondary | ICD-10-CM | POA: Diagnosis not present

## 2023-09-14 DIAGNOSIS — R262 Difficulty in walking, not elsewhere classified: Secondary | ICD-10-CM | POA: Diagnosis not present

## 2023-09-16 DIAGNOSIS — R262 Difficulty in walking, not elsewhere classified: Secondary | ICD-10-CM | POA: Diagnosis not present

## 2023-09-16 DIAGNOSIS — M5459 Other low back pain: Secondary | ICD-10-CM | POA: Diagnosis not present

## 2023-09-16 DIAGNOSIS — M6281 Muscle weakness (generalized): Secondary | ICD-10-CM | POA: Diagnosis not present

## 2023-09-16 DIAGNOSIS — M79604 Pain in right leg: Secondary | ICD-10-CM | POA: Diagnosis not present

## 2023-09-21 DIAGNOSIS — R262 Difficulty in walking, not elsewhere classified: Secondary | ICD-10-CM | POA: Diagnosis not present

## 2023-09-21 DIAGNOSIS — M5459 Other low back pain: Secondary | ICD-10-CM | POA: Diagnosis not present

## 2023-09-21 DIAGNOSIS — M6281 Muscle weakness (generalized): Secondary | ICD-10-CM | POA: Diagnosis not present

## 2023-09-21 DIAGNOSIS — M79604 Pain in right leg: Secondary | ICD-10-CM | POA: Diagnosis not present

## 2023-09-23 DIAGNOSIS — R262 Difficulty in walking, not elsewhere classified: Secondary | ICD-10-CM | POA: Diagnosis not present

## 2023-09-23 DIAGNOSIS — M79604 Pain in right leg: Secondary | ICD-10-CM | POA: Diagnosis not present

## 2023-09-23 DIAGNOSIS — M6281 Muscle weakness (generalized): Secondary | ICD-10-CM | POA: Diagnosis not present

## 2023-09-23 DIAGNOSIS — M5459 Other low back pain: Secondary | ICD-10-CM | POA: Diagnosis not present

## 2023-09-28 DIAGNOSIS — M79604 Pain in right leg: Secondary | ICD-10-CM | POA: Diagnosis not present

## 2023-09-28 DIAGNOSIS — M5459 Other low back pain: Secondary | ICD-10-CM | POA: Diagnosis not present

## 2023-09-28 DIAGNOSIS — M6281 Muscle weakness (generalized): Secondary | ICD-10-CM | POA: Diagnosis not present

## 2023-09-28 DIAGNOSIS — R262 Difficulty in walking, not elsewhere classified: Secondary | ICD-10-CM | POA: Diagnosis not present

## 2023-09-30 DIAGNOSIS — M79604 Pain in right leg: Secondary | ICD-10-CM | POA: Diagnosis not present

## 2023-09-30 DIAGNOSIS — M5459 Other low back pain: Secondary | ICD-10-CM | POA: Diagnosis not present

## 2023-09-30 DIAGNOSIS — M6281 Muscle weakness (generalized): Secondary | ICD-10-CM | POA: Diagnosis not present

## 2023-09-30 DIAGNOSIS — R262 Difficulty in walking, not elsewhere classified: Secondary | ICD-10-CM | POA: Diagnosis not present

## 2023-10-05 DIAGNOSIS — M5459 Other low back pain: Secondary | ICD-10-CM | POA: Diagnosis not present

## 2023-10-05 DIAGNOSIS — M79604 Pain in right leg: Secondary | ICD-10-CM | POA: Diagnosis not present

## 2023-10-05 DIAGNOSIS — M6281 Muscle weakness (generalized): Secondary | ICD-10-CM | POA: Diagnosis not present

## 2023-10-05 DIAGNOSIS — R262 Difficulty in walking, not elsewhere classified: Secondary | ICD-10-CM | POA: Diagnosis not present

## 2023-10-07 DIAGNOSIS — H52203 Unspecified astigmatism, bilateral: Secondary | ICD-10-CM | POA: Diagnosis not present

## 2023-10-07 DIAGNOSIS — M6281 Muscle weakness (generalized): Secondary | ICD-10-CM | POA: Diagnosis not present

## 2023-10-07 DIAGNOSIS — M5459 Other low back pain: Secondary | ICD-10-CM | POA: Diagnosis not present

## 2023-10-07 DIAGNOSIS — Z961 Presence of intraocular lens: Secondary | ICD-10-CM | POA: Diagnosis not present

## 2023-10-07 DIAGNOSIS — R262 Difficulty in walking, not elsewhere classified: Secondary | ICD-10-CM | POA: Diagnosis not present

## 2023-10-07 DIAGNOSIS — H35371 Puckering of macula, right eye: Secondary | ICD-10-CM | POA: Diagnosis not present

## 2023-10-07 DIAGNOSIS — M79604 Pain in right leg: Secondary | ICD-10-CM | POA: Diagnosis not present

## 2023-10-12 DIAGNOSIS — M5459 Other low back pain: Secondary | ICD-10-CM | POA: Diagnosis not present

## 2023-10-12 DIAGNOSIS — R262 Difficulty in walking, not elsewhere classified: Secondary | ICD-10-CM | POA: Diagnosis not present

## 2023-10-12 DIAGNOSIS — M6281 Muscle weakness (generalized): Secondary | ICD-10-CM | POA: Diagnosis not present

## 2023-10-12 DIAGNOSIS — M79604 Pain in right leg: Secondary | ICD-10-CM | POA: Diagnosis not present

## 2023-10-14 DIAGNOSIS — M419 Scoliosis, unspecified: Secondary | ICD-10-CM | POA: Diagnosis not present

## 2023-10-14 DIAGNOSIS — R262 Difficulty in walking, not elsewhere classified: Secondary | ICD-10-CM | POA: Diagnosis not present

## 2023-10-14 DIAGNOSIS — M6281 Muscle weakness (generalized): Secondary | ICD-10-CM | POA: Diagnosis not present

## 2023-10-14 DIAGNOSIS — M5416 Radiculopathy, lumbar region: Secondary | ICD-10-CM | POA: Diagnosis not present

## 2023-10-14 DIAGNOSIS — M5459 Other low back pain: Secondary | ICD-10-CM | POA: Diagnosis not present

## 2023-10-14 DIAGNOSIS — M79604 Pain in right leg: Secondary | ICD-10-CM | POA: Diagnosis not present

## 2023-10-15 ENCOUNTER — Other Ambulatory Visit (HOSPITAL_COMMUNITY): Payer: Self-pay | Admitting: Emergency Medicine

## 2023-10-15 ENCOUNTER — Ambulatory Visit (HOSPITAL_COMMUNITY)
Admission: RE | Admit: 2023-10-15 | Discharge: 2023-10-15 | Disposition: A | Discharge status: Home / Self Care | Source: Ambulatory Visit | Attending: Cardiology | Admitting: Cardiology

## 2023-10-15 ENCOUNTER — Encounter: Payer: Self-pay | Admitting: Cardiovascular Disease

## 2023-10-15 DIAGNOSIS — R0609 Other forms of dyspnea: Secondary | ICD-10-CM | POA: Diagnosis not present

## 2023-10-15 DIAGNOSIS — I272 Pulmonary hypertension, unspecified: Secondary | ICD-10-CM

## 2023-10-15 LAB — ECHOCARDIOGRAM COMPLETE
AR max vel: 2.36 cm2
AV Area VTI: 2.41 cm2
AV Area mean vel: 2.37 cm2
AV Mean grad: 2 mmHg
AV Peak grad: 4.1 mmHg
Ao pk vel: 1.01 m/s
Area-P 1/2: 4.68 cm2
MV M vel: 3.43 m/s
MV Peak grad: 47.1 mmHg
S' Lateral: 2.65 cm

## 2023-10-19 DIAGNOSIS — R262 Difficulty in walking, not elsewhere classified: Secondary | ICD-10-CM | POA: Diagnosis not present

## 2023-10-19 DIAGNOSIS — M6281 Muscle weakness (generalized): Secondary | ICD-10-CM | POA: Diagnosis not present

## 2023-10-19 DIAGNOSIS — M5459 Other low back pain: Secondary | ICD-10-CM | POA: Diagnosis not present

## 2023-10-19 DIAGNOSIS — M79604 Pain in right leg: Secondary | ICD-10-CM | POA: Diagnosis not present

## 2023-10-26 DIAGNOSIS — M6281 Muscle weakness (generalized): Secondary | ICD-10-CM | POA: Diagnosis not present

## 2023-10-26 DIAGNOSIS — M5459 Other low back pain: Secondary | ICD-10-CM | POA: Diagnosis not present

## 2023-10-26 DIAGNOSIS — M79604 Pain in right leg: Secondary | ICD-10-CM | POA: Diagnosis not present

## 2023-10-26 DIAGNOSIS — R262 Difficulty in walking, not elsewhere classified: Secondary | ICD-10-CM | POA: Diagnosis not present

## 2023-10-27 ENCOUNTER — Other Ambulatory Visit: Payer: Self-pay | Admitting: Emergency Medicine

## 2023-10-27 DIAGNOSIS — I272 Pulmonary hypertension, unspecified: Secondary | ICD-10-CM

## 2023-10-27 NOTE — Telephone Encounter (Signed)
 Spoke with Valinda Gault in respiratory- told of pt needing PFT's. Order placed. She will call the patient to schedule.

## 2023-11-04 ENCOUNTER — Ambulatory Visit (HOSPITAL_COMMUNITY)
Admission: RE | Admit: 2023-11-04 | Discharge: 2023-11-04 | Disposition: A | Discharge status: Home / Self Care | Source: Ambulatory Visit | Attending: Cardiovascular Disease | Admitting: Cardiovascular Disease

## 2023-11-04 DIAGNOSIS — I272 Pulmonary hypertension, unspecified: Secondary | ICD-10-CM | POA: Diagnosis not present

## 2023-11-04 LAB — PULMONARY FUNCTION TEST
DL/VA % pred: 63 %
DL/VA: 2.48 ml/min/mmHg/L
DLCO unc % pred: 57 %
DLCO unc: 12.85 ml/min/mmHg
FEF 25-75 Post: 1.35 L/s
FEF 25-75 Pre: 0.91 L/s
FEF2575-%Change-Post: 47 %
FEF2575-%Pred-Post: 82 %
FEF2575-%Pred-Pre: 55 %
FEV1-%Change-Post: 12 %
FEV1-%Pred-Post: 87 %
FEV1-%Pred-Pre: 77 %
FEV1-Post: 2.17 L
FEV1-Pre: 1.93 L
FEV1FVC-%Change-Post: 6 %
FEV1FVC-%Pred-Pre: 84 %
FEV6-%Change-Post: 5 %
FEV6-%Pred-Post: 100 %
FEV6-%Pred-Pre: 94 %
FEV6-Post: 3.28 L
FEV6-Pre: 3.09 L
FEV6FVC-%Change-Post: 0 %
FEV6FVC-%Pred-Post: 104 %
FEV6FVC-%Pred-Pre: 103 %
FVC-%Change-Post: 5 %
FVC-%Pred-Post: 96 %
FVC-%Pred-Pre: 91 %
FVC-Post: 3.4 L
FVC-Pre: 3.22 L
Post FEV1/FVC ratio: 64 %
Post FEV6/FVC ratio: 96 %
Pre FEV1/FVC ratio: 60 %
Pre FEV6/FVC Ratio: 96 %
RV % pred: 121 %
RV: 3.12 L
TLC % pred: 100 %
TLC: 6.6 L

## 2023-11-04 MED ORDER — ALBUTEROL SULFATE (2.5 MG/3ML) 0.083% IN NEBU
2.5000 mg | INHALATION_SOLUTION | Freq: Once | RESPIRATORY_TRACT | Status: AC
  Administered 2023-11-04: 2.5 mg via RESPIRATORY_TRACT

## 2023-11-05 ENCOUNTER — Ambulatory Visit: Payer: Self-pay | Admitting: Cardiovascular Disease

## 2023-11-10 ENCOUNTER — Ambulatory Visit: Admitting: Primary Care

## 2023-11-10 ENCOUNTER — Encounter: Payer: Self-pay | Admitting: Primary Care

## 2023-11-10 VITALS — BP 138/60 | HR 61 | Ht 67.5 in | Wt 181.4 lb

## 2023-11-10 DIAGNOSIS — J449 Chronic obstructive pulmonary disease, unspecified: Secondary | ICD-10-CM | POA: Diagnosis not present

## 2023-11-10 DIAGNOSIS — Z87891 Personal history of nicotine dependence: Secondary | ICD-10-CM | POA: Diagnosis not present

## 2023-11-10 DIAGNOSIS — Z902 Acquired absence of lung [part of]: Secondary | ICD-10-CM | POA: Diagnosis not present

## 2023-11-10 MED ORDER — FLUTICASONE-SALMETEROL 250-50 MCG/ACT IN AEPB: 1.0000 | INHALATION_SPRAY | Freq: Two times a day (BID) | RESPIRATORY_TRACT | 2 refills | Status: AC

## 2023-11-10 NOTE — Progress Notes (Signed)
 @Patient  ID: George Montoya., male    DOB: September 15, 1940, 83 y.o.   MRN: [id number]  Chief Complaint  Patient presents with   Follow-up    PFT last week 5/6    Shortness of Breath    Dyspnea upon exertion, pt reports no chest pain    Referring provider: Bertha Broad, MD  HPI: 83 year male, former smoker. PMH COPD GOLD 0, primary cancer left upper lung s/p lobectomy, HTN, CAD. Patient of Dr. Waymond Montoya, last seen on 01/25/20.   Previous [facility] pulmonary encounter: 01/25/2020  f/u ov/George Montoya re: copd 0 / no better on lama/laba      Chief Complaint  Patient presents with   Follow-up      pt reports feeling at baseline status; no complaints. states here for 6 month check up   Dyspnea:  [place]  Cough: none  Sleeping: fine flat SABA use: none 02: none    09/18/2021 Patient presents today for 1 year follow-up. He is doing well, no acute respiratory complaints. States that he has a bad habit of breathing loudly with his mouth open. Once he corrects this there he has no issues. This does not particularly bother him. He denies shortness of breath. He walks 10,000 steps a day. He has learned to pace himself better. He had a dry cough. His GP put him on Trelegy . At that same time he was started on omeprazole. He is no longer coughing at night.     11/10/2023- Interim hx   Former smoker, quit in 1983 S/p lobectomy in Nov 2016 Mild obstruction on PFTs in 2021. No better on Stiolto or Anoro.  GP put patient on Trelegy in 2023, no clear benefit from triple therapy Nocturnal cough resolved while on PPI  GERD managed with omeprazole. Famotidine   Repeat PFT in May 2025 showed minimal obstruction 77% predicated (previously 85%) with positive BD response  No cardiac reason for shortness of breath identified  Cardiology switched beta blockers    Discussed the use of AI scribe software for clinical note transcription with the patient, who gave verbal consent to  proceed.  History of Present Illness   George Montoya. is an 83 year old male with a history of lobectomy who presents for an annual check-up and evaluation of chronic shortness of breath.  He experiences chronic shortness of breath, particularly on exertion, which has remained stable over time. He occasionally experiences wheezing, which he can often alleviate by coughing. He uses a rescue inhaler, albuterol , one to two puffs as needed, but notes he uses it infrequently, having renewed it last August and still having about 100 puffs remaining.  He has a history of a lobectomy of the left upper lung and has been tried on several inhalers in the past, including Stiolto, Anoro, and Trelegy, without significant benefit. His most recent breathing test showed mild obstructive lung disease with some improvement after albuterol .  He is fairly active, walking 9,000 steps a day and playing golf, but notes difficulty with inclines, such as the 140 steps [place], which leaves him very winded. No current issues with cough, phlegm, chest tightness, or limitations in daily activities due to breathing. He reports sleeping soundly and having plenty of energy.  He recently completed four months of physical therapy for balance issues related to hearing loss in his left ear and scoliosis, which has improved his balance and core strength.  He continues to perform exercises at home and lifts weights, noting some breathlessness with this activity.  No current use of omeprazole for reflux, stating he does not experience heartburn, acid reflux, or burping. He takes Pepcid  at bedtime.     Dyspnea: dyspnea with inclines, gets 9,000 steps a day  Cough: none  Sleeping: no issues  SABA use: none  02: none     Imaging: 06/19/21 CT chest wo contrast  >> prior left upper lobectomy with stable post treatment changes. NO evidence of recurrent or metastatic disease. Aortic  atherosclerosis.   Pulmonary function testing: PFT's  07/29/2019  FEV1 2.39 (88 % ) ratio 0.70  p 3 % improvement from saba p nothing  prior to study with DLCO  19.06 (82%) corrects to 3.54 (89%)  for alv volume and FV curve classic mild concavity   PFTs 10/27/23 >> FVC 3.40 (96%), FEV1 2.17 (87%), ratio 64, DLCOunc 12.85 (57%) Minimal obstructive airway disease, positive BD response Moderate diffusion defect   Allergies  Allergen Reactions   Amlodipine Other (See Comments)   Hydrochlorothiazide -Triamterene Other (See Comments)    Immunization History  Administered Date(s) Administered   Influenza, High Dose Seasonal PF 03/21/2019   Moderna Sars-Covid-2 Vaccination 07/14/2019, 08/10/2019   Pneumococcal-Unspecified 06/23/2004   Tdap 08/30/2017   Zoster Recombinant(Shingrix) 06/01/2017, 06/23/2017, 10/26/2017, 12/30/2017    Past Medical History:  Diagnosis Date   Aortic aneurysm (HCC)    DR Sherene Dilling    JUST WATCHING    Arthritis    spine   Bowel obstruction (HCC)    GERD (gastroesophageal reflux disease)    Headache    hx of 2 migraines as a college roommates   High cholesterol    Hypertension    Melanoma (HCC)    also basal cell carcinomas   Meniere's disease of left ear    Radiation 07/03/2015-08/16/2015   Left Mid Chest, surgical clips 66 gray   Scoliosis deformity of spine    Shingles    04/2014     RIGHT EYE, SCALP   Shingles    right eye, forehead and head   Shortness of breath dyspnea    WITH EXERTION    Sleep related leg cramps    Syncopal episodes    x 1 in October, 2015.    Tobacco History: Social History   Tobacco Use  Smoking Status Former   Current packs/day: 0.00   Average packs/day: 2.0 packs/day for 25.0 years (50.0 ttl pk-yrs)   Types: Cigarettes   Start date: 10/21/1956   Quit date: 10/21/1981   Years since quitting: 42.0  Smokeless Tobacco Never   Counseling given: Not Answered   Outpatient Medications Prior to Visit  Medication Sig  Dispense Refill   albuterol  (VENTOLIN  HFA) 108 (90 Base) MCG/ACT inhaler Inhale 1-2 puffs into the lungs as needed for shortness of breath or wheezing.     Calcium  Carb-Cholecalciferol (CALCIUM  600 + D PO) Take 600 mg by mouth daily.     carvedilol  (COREG ) 12.5 MG tablet Take 1 tablet (12.5 mg total) by mouth 2 (two) times daily. 180 tablet 3   famotidine  (PEPCID ) 20 MG tablet Take 1 tablet (20 mg total) by mouth at bedtime. 30 tablet 11   hydrochlorothiazide  (MICROZIDE ) 12.5 MG capsule TAKE ONE CAPSULE BY MOUTH DAILY 90 capsule 2   Multiple Vitamin (MULTIVITAMIN WITH MINERALS) TABS tablet Take 2 tablets by mouth daily.     omeprazole (PRILOSEC) 20 MG capsule      potassium chloride  SA (  K-DUR,KLOR-CON ) 20 MEQ tablet 20 mEq 2 (two) times daily.      rosuvastatin  (CRESTOR ) 10 MG tablet Take 10 mg by mouth daily. In AM (Patient not taking: Reported on 09/09/2023)     rosuvastatin  (CRESTOR ) 5 MG tablet Take 5 mg by mouth daily.     sildenafil (REVATIO) 20 MG tablet Take 20 mg by mouth as needed. Take 1-5 tablets as needed     telmisartan  (MICARDIS ) 80 MG tablet Take 1 tablet (80 mg total) by mouth daily. 30 tablet 11   Facility-Administered Medications Prior to Visit  Medication Dose Route Frequency Provider Last Rate Last Admin   0.9 %  sodium chloride  infusion  500 mL Intravenous Continuous Armbruster, Lendon Queen, MD        Review of Systems  Review of Systems  Constitutional: Negative.   HENT: Negative.    Respiratory:  Positive for shortness of breath and wheezing.   Cardiovascular: Negative.   Psychiatric/Behavioral: Negative.     Physical Exam  There were no vitals taken for this visit. Physical Exam Constitutional:      Appearance: He is well-developed. He is not ill-appearing.  HENT:     Head: Normocephalic and atraumatic.  Cardiovascular:     Rate and Rhythm: Normal rate and regular rhythm.  Pulmonary:     Effort: Pulmonary effort is normal.     Breath sounds: Examination of  the right-upper field reveals wheezing. Examination of the left-upper field reveals wheezing. Wheezing present.  Musculoskeletal:        General: Normal range of motion.  Skin:    General: Skin is warm and dry.  Neurological:     General: No focal deficit present.     Mental Status: He is alert and oriented to person, place, and time.  Psychiatric:        Mood and Affect: Mood normal.        Behavior: Behavior normal.      Lab Results:  CBC    Component Value Date/Time   WBC 5.5 07/20/2023 2152   RBC 3.41 (L) 07/20/2023 2152   HGB 12.0 (L) 07/20/2023 2152   HCT 34.0 (L) 07/20/2023 2152   PLT 142 (L) 07/20/2023 2152   MCV 99.7 07/20/2023 2152   MCV 102.6 (A) 02/01/2014 1635   MCH 35.2 (H) 07/20/2023 2152   MCHC 35.3 07/20/2023 2152   RDW 12.7 07/20/2023 2152   LYMPHSABS 1.0 07/20/2023 2152   MONOABS 0.9 07/20/2023 2152   EOSABS 0.1 07/20/2023 2152   BASOSABS 0.0 07/20/2023 2152    BMET    Component Value Date/Time   NA 133 (L) 07/20/2023 2152   K 4.3 07/20/2023 2152   CL 99 07/20/2023 2152   CO2 25 07/20/2023 2152   GLUCOSE 86 07/20/2023 2152   BUN 14 07/20/2023 2152   CREATININE 0.92 07/20/2023 2152   CREATININE 1.1 10/04/2015 1438   CALCIUM  9.7 07/20/2023 2152   GFRNONAA >60 07/20/2023 2152   GFRAA >60 05/22/2015 0450    BNP No results found for: "BNP"  ProBNP    Component Value Date/Time   PROBNP 66.0 05/03/2019 1542    Imaging: ECHOCARDIOGRAM COMPLETE Result Date: 10/15/2023    ECHOCARDIOGRAM REPORT   Patient Name:   Daytona Retana. Date of Exam: 10/15/2023 Medical Rec #:  [id number]           Height:       67.5 in Accession #:    [id number]  Weight:       179.4 lb Date of Birth:  20-Feb-1941           BSA:          1.942 m Patient Age:    82 years            BP:           122/68 mmHg Patient Gender: M                   HR:           54 bpm. Exam Location:  Outpatient Procedure: 2D Echo, Cardiac Doppler and Color Doppler (Both Spectral and  Color            Flow Doppler were utilized during procedure). Indications:    DOE (dyspnea on exertion) [R06.09 (ICD-10-CM)]  History:        Patient has prior history of Echocardiogram examinations, most                 recent 03/29/2014. CAD, COPD; Risk Factors:Hypertension, Former                 Smoker and Dyslipidemia. H/o lobectomy of lung.  Sonographer:    Delford Felling MHA, RDMS, RVT, RDCS Referring Phys: [id number] [facility]  Sonographer Comments: Image acquisition challenging due to respiratory motion. IMPRESSIONS  1. Left ventricular ejection fraction, by estimation, is 60 to 65%. The left ventricle has normal function. The left ventricle has no regional wall motion abnormalities. Left ventricular diastolic parameters were normal. There is the interventricular septum is flattened in systole, consistent with right ventricular pressure overload.  2. Right ventricular systolic function is normal. The right ventricular size is mildly enlarged. There is mildly elevated pulmonary artery systolic pressure.  3. Right atrial size was mildly dilated.  4. The mitral valve is degenerative. Moderate mitral valve regurgitation. No evidence of mitral stenosis.  5. The aortic valve is tricuspid. There is mild thickening of the aortic valve. Aortic valve regurgitation is trivial. Aortic valve sclerosis is present, with no evidence of aortic valve stenosis.  6. The inferior vena cava is normal in size with <50% respiratory variability, suggesting right atrial pressure of 8 mmHg. Comparison(s): Unable to view 2015 study. FINDINGS  Left Ventricle: Left ventricular ejection fraction, by estimation, is 60 to 65%. The left ventricle has normal function. The left ventricle has no regional wall motion abnormalities. Strain was performed and the global longitudinal strain is indeterminate. The left ventricular internal cavity size was normal in size. There is no left ventricular hypertrophy. The interventricular septum is  flattened in systole, consistent with right ventricular pressure overload. Left ventricular diastolic parameters were normal. Right Ventricle: The right ventricular size is mildly enlarged. No increase in right ventricular wall thickness. Right ventricular systolic function is normal. There is mildly elevated pulmonary artery systolic pressure. The tricuspid regurgitant velocity is 2.66 m/s, and with an assumed right atrial pressure of 8 mmHg, the estimated right ventricular systolic pressure is 36.3 mmHg. Left Atrium: Left atrial size was normal in size. Right Atrium: Right atrial size was mildly dilated. Pericardium: There is no evidence of pericardial effusion. Presence of epicardial fat layer. Mitral Valve: The mitral valve is degenerative in appearance. Mild to moderate mitral annular calcification. Moderate mitral valve regurgitation. No evidence of mitral valve stenosis. Tricuspid Valve: The tricuspid valve is normal in structure. Tricuspid valve regurgitation is mild . No evidence of tricuspid stenosis. Aortic Valve: The aortic valve  is tricuspid. There is mild thickening of the aortic valve. Aortic valve regurgitation is trivial. Aortic valve sclerosis is present, with no evidence of aortic valve stenosis. Aortic valve mean gradient measures 2.0 mmHg. Aortic valve peak gradient measures 4.1 mmHg. Aortic valve area, by VTI measures 2.41 cm. Pulmonic Valve: The pulmonic valve was normal in structure. Pulmonic valve regurgitation is trivial. No evidence of pulmonic stenosis. Aorta: The aortic root and ascending aorta are structurally normal, with no evidence of dilitation. Pulmonary Artery: The pulmonary artery is of normal size. Venous: The inferior vena cava is normal in size with less than 50% respiratory variability, suggesting right atrial pressure of 8 mmHg. IAS/Shunts: The atrial septum is grossly normal. Additional Comments: 3D was performed not requiring image post processing on an independent  workstation and was indeterminate.  LEFT VENTRICLE PLAX 2D LVIDd:         4.27 cm   Diastology LVIDs:         2.65 cm   LV e' medial:    7.72 cm/s LV PW:         0.95 cm   LV E/e' medial:  13.9 LV IVS:        0.78 cm   LV e' lateral:   12.80 cm/s LVOT diam:     1.82 cm   LV E/e' lateral: 8.4 LV SV:         55 LV SV Index:   28 LVOT Area:     2.60 cm  RIGHT VENTRICLE          IVC RV Basal diam:  3.66 cm  IVC diam: 1.86 cm RV Mid diam:    2.95 cm TAPSE (M-mode): 1.6 cm LEFT ATRIUM             Index        RIGHT ATRIUM           Index LA diam:        4.65 cm 2.39 cm/m   RA Area:     19.90 cm LA Vol (A2C):   44.3 ml 22.82 ml/m  RA Volume:   63.45 ml  32.68 ml/m LA Vol (A4C):   57.5 ml 29.61 ml/m LA Biplane Vol: 52.6 ml 27.09 ml/m  AORTIC VALVE AV Area (Vmax):    2.36 cm AV Area (Vmean):   2.37 cm AV Area (VTI):     2.41 cm AV Vmax:           101.00 cm/s AV Vmean:          65.600 cm/s AV VTI:            0.228 m AV Peak Grad:      4.1 mmHg AV Mean Grad:      2.0 mmHg LVOT Vmax:         91.70 cm/s LVOT Vmean:        59.700 cm/s LVOT VTI:          0.211 m LVOT/AV VTI ratio: 0.93  AORTA Ao Root diam: 3.03 cm Ao Asc diam:  3.59 cm MITRAL VALVE                TRICUSPID VALVE MV Area (PHT): 4.68 cm     TR Peak grad:   28.3 mmHg MV Decel Time: 162 msec     TR Vmax:        266.00 cm/s MR Peak grad: 47.1 mmHg MR Vmax:      343.00 cm/s   SHUNTS  MV E velocity: 107.00 cm/s  Systemic VTI:  0.21 m MV A velocity: 67.60 cm/s   Systemic Diam: 1.82 cm MV E/A ratio:  1.58 Gloriann Larger MD Electronically signed by Gloriann Larger MD Signature Date/Time: 10/15/2023/12:49:15 PM    Final      Assessment & Plan:   1. COPD GOLD 0 (Primary)  2. S/P lobectomy of lung  Assessment and Plan    Mild obstructive lung disease with asthmatic component Chronic mild obstructive lung disease with an asthmatic component, evidenced by improvement in lung function post-bronchodilator. Symptoms include dyspnea on exertion and  intermittent wheezing. Previous inhalers without steroids (Stiolto, Anoro) showed no benefit. Trelegy, which contains a steroid, was used in 2023 but also showed no clear benefit. Current lung function is 87% post-bronchodilator, indicating mild obstruction. He is active, achieving 9,000 steps daily, but experiences significant dyspnea on inclines. - Prescribe inhaler with steroid and bronchodilator (e.g., Breo, Advair, or Symbicort) based on insurance coverage. - Advise to rinse mouth after use to prevent thrush. - Use albuterol  as a rescue inhaler for breakthrough symptoms or before exercise. - Maintain a journal to track symptoms and efficacy of inhaler. - Follow up in 4-6 weeks to assess improvement in symptoms.  Lobectomy and lung scarring Left upper lobectomy and lung scarring likely contribute to reduced lung function. CT scan from January shows postoperative changes with no new nodules or masses. No evidence of emphysema. Lung function has decreased from 104% to 87% over five years, but remains within normal limits post-bronchodilator.  Hearing loss in left ear Hearing loss in the left ear with associated balance issues. Balance has improved with physical therapy, allowing for better stability, especially in low-light conditions.  Follow-up Follow-up plan to assess the efficacy of the new inhaler and overall symptom management. - Schedule follow-up appointment in 4-6 weeks to evaluate the effectiveness of the inhaler and symptom improvement.   Antonio Baumgarten, NP 11/10/2023

## 2023-11-10 NOTE — Patient Instructions (Addendum)
-  MILD OBSTRUCTIVE LUNG DISEASE WITH ASTHMATIC COMPONENT: Mild obstructive lung disease with an asthmatic component means that your airways are slightly narrowed, making it harder to breathe, especially during physical activity. We are prescribing an inhaler that contains both a steroid and a bronchodilator (such as Breo, Advair, or Symbicort). Please rinse your mouth after using the inhaler to prevent any infections. Continue using your albuterol  inhaler as needed for sudden symptoms or before exercise. Keep a journal to track your symptoms and how well the inhaler is working. We will follow up in 4-6 weeks to see how you are doing.  -LOBECTOMY AND LUNG SCARRING: Your previous surgery to remove part of your lung and the resulting scarring can contribute to your reduced lung function. Your recent CT scan showed no new issues, and your lung function remains within normal limits after using a bronchodilator.  Follow-up Return in 4-6 weeks with Jerlene Moody NP or Dr. Waymond Hailey to evaluate the effectiveness of your new inhaler and to discuss any changes in your symptoms.

## 2023-11-12 MED ORDER — NEBIVOLOL HCL 10 MG PO TABS: 10.0000 mg | ORAL_TABLET | Freq: Every day | ORAL | 3 refills | Status: DC

## 2023-11-12 NOTE — Telephone Encounter (Signed)
 Spoke with pt, he reports he was seen by his lung person recently and they looked at the CT scan he had done and said he does not have emphysema but mild COPD. Aware of dr croitoru's recommendations. New script sent to the pharmacy and patient aware to track blood pressures and send to us  via my chart. He wants to make dr croitoru aware of the COPD. Aware will forward.

## 2023-11-12 NOTE — Telephone Encounter (Signed)
-----   Message from [facility] sent at 11/05/2023  9:17 AM EDT ----- PFTs most consistent with emphysema, with some response to albuterol .  Switch from carvedilol  to a more selective beta blocker:   - start nebivolol 10 mg once daily and stop carvedilol . - please send us  a log of BP in approximately 2 weeks.

## 2023-11-23 ENCOUNTER — Telehealth: Payer: Self-pay | Admitting: Cardiovascular Disease

## 2023-11-23 MED ORDER — NEBIVOLOL HCL 10 MG PO TABS
10.0000 mg | ORAL_TABLET | Freq: Every day | ORAL | 3 refills | Status: DC
Start: 2023-11-23 — End: 2024-02-08

## 2023-11-23 NOTE — Telephone Encounter (Signed)
 Left message for patient to call back

## 2023-11-23 NOTE — Telephone Encounter (Signed)
 Patient calling in about medication that the dr was suppose to make changes too. Please advise

## 2023-11-23 NOTE — Telephone Encounter (Signed)
 Patient returning call, he states he did not receive new Rx for nebivolol . Rx was printed, patient did not receive print Rx. Resent Rx to [employer].  Patient expressed appreciation for assistance.

## 2023-12-08 DIAGNOSIS — Z85820 Personal history of malignant melanoma of skin: Secondary | ICD-10-CM | POA: Diagnosis not present

## 2023-12-08 DIAGNOSIS — L821 Other seborrheic keratosis: Secondary | ICD-10-CM | POA: Diagnosis not present

## 2023-12-08 DIAGNOSIS — Z85828 Personal history of other malignant neoplasm of skin: Secondary | ICD-10-CM | POA: Diagnosis not present

## 2023-12-08 DIAGNOSIS — L905 Scar conditions and fibrosis of skin: Secondary | ICD-10-CM | POA: Diagnosis not present

## 2023-12-08 DIAGNOSIS — D1801 Hemangioma of skin and subcutaneous tissue: Secondary | ICD-10-CM | POA: Diagnosis not present

## 2023-12-08 DIAGNOSIS — L723 Sebaceous cyst: Secondary | ICD-10-CM | POA: Diagnosis not present

## 2023-12-08 DIAGNOSIS — L570 Actinic keratosis: Secondary | ICD-10-CM | POA: Diagnosis not present

## 2023-12-08 DIAGNOSIS — D692 Other nonthrombocytopenic purpura: Secondary | ICD-10-CM | POA: Diagnosis not present

## 2023-12-15 ENCOUNTER — Other Ambulatory Visit: Payer: Self-pay | Admitting: Cardiovascular Disease

## 2023-12-21 ENCOUNTER — Telehealth: Payer: Self-pay | Admitting: Emergency Medicine

## 2023-12-21 NOTE — Telephone Encounter (Signed)
 Left message stating that Dr Francyne has reviewed your BP log and said to please continue same Meds.

## 2024-01-20 ENCOUNTER — Encounter: Payer: Self-pay | Admitting: Primary Care

## 2024-01-20 ENCOUNTER — Ambulatory Visit: Admitting: Primary Care

## 2024-01-20 VITALS — BP 160/71 | HR 53 | Temp 97.6°F | Ht 67.0 in | Wt 180.4 lb

## 2024-01-20 DIAGNOSIS — J449 Chronic obstructive pulmonary disease, unspecified: Secondary | ICD-10-CM

## 2024-01-20 MED ORDER — IPRATROPIUM BROMIDE 0.03 % NA SOLN: 2.0000 | Freq: Two times a day (BID) | NASAL | 1 refills | Status: AC

## 2024-01-20 NOTE — Progress Notes (Signed)
 @Patient  ID: George Montoya., male    DOB: 08-Sep-1940, 83 y.o.   MRN: [id number]  Chief Complaint  Patient presents with   Follow-up    COPD F/U.     Referring provider: Yolande Toribio MATSU, MD  HPI:  83 year male, former smoker. PMH COPD GOLD 0, primary cancer left upper lung s/p lobectomy, HTN, CAD. Patient of Dr. Darlean, last seen on 01/25/20.   Previous [facility] pulmonary encounter: 01/25/2020  f/u ov/Wert re: copd 0 / no better on lama/laba      Chief Complaint  Patient presents with   Follow-up      pt reports feeling at baseline status; no complaints. states here for 6 month check up   Dyspnea:  [place]  Cough: none  Sleeping: fine flat SABA use: none 02: none    09/18/2021 Patient presents today for 1 year follow-up. He is doing well, no acute respiratory complaints. States that he has a bad habit of breathing loudly with his mouth open. Once he corrects this there he has no issues. This does not particularly bother him. He denies shortness of breath. He walks 10,000 steps a day. He has learned to pace himself better. He had a dry cough. His GP put him on Trelegy . At that same time he was started on omeprazole. He is no longer coughing at night.     11/10/2023- Interim hx   Former smoker, quit in 1983 S/p lobectomy in Nov 2016 Mild obstruction on PFTs in 2021. No better on Stiolto or Anoro.  GP put patient on Trelegy in 2023, no clear benefit from triple therapy Nocturnal cough resolved while on PPI  GERD managed with omeprazole. Famotidine   Repeat PFT in May 2025 showed minimal obstruction 77% predicated (previously 85%) with positive BD response  No cardiac reason for shortness of breath identified  Cardiology switched beta blockers    Discussed the use of AI scribe software for clinical note transcription with the patient, who gave verbal consent to proceed.  History of Present Illness   George Montoya. is an 83 year old male with a history  of lobectomy who presents for an annual check-up and evaluation of chronic shortness of breath.  He experiences chronic shortness of breath, particularly on exertion, which has remained stable over time. He occasionally experiences wheezing, which he can often alleviate by coughing. He uses a rescue inhaler, albuterol , one to two puffs as needed, but notes he uses it infrequently, having renewed it last August and still having about 100 puffs remaining.  He has a history of a lobectomy of the left upper lung and has been tried on several inhalers in the past, including Stiolto, Anoro, and Trelegy, without significant benefit. His most recent breathing test showed mild obstructive lung disease with some improvement after albuterol .  He is fairly active, walking 9,000 steps a day and playing golf, but notes difficulty with inclines, such as the 140 steps to the [place], which leaves him very winded. No current issues with cough, phlegm, chest tightness, or limitations in daily activities due to breathing. He reports sleeping soundly and having plenty of energy.  He recently completed four months of physical therapy for balance issues related to hearing loss in his left ear and scoliosis, which has improved his balance and core strength. He continues to perform exercises at home and lifts weights, noting some breathlessness with this activity.  No current use of omeprazole for reflux, stating he does not experience heartburn, acid reflux, or burping. He takes Pepcid  at bedtime.     Dyspnea: dyspnea with inclines, gets 9,000 steps a day  Cough: none  Sleeping: no issues  SABA use: none  02: none     01/20/2024- Interim hx  Discussed the use of AI scribe software for clinical note transcription with the patient, who gave verbal consent to proceed.  History of Present Illness   George Montoya. is an 83 year old male with mild COPD who presents for a  follow-up visit.  He experiences shortness of breath with exertion and intermittent wheezing. Previously, he used an inhaler without a steroid, which did not provide benefit. In 2020, he used Trelegy, also without clear benefit. His lung function is at 87%. Since May, he has been using Advair, an inhaler with a steroid and bronchodilator, once every morning, noting a slight improvement in symptoms.  He has a history of an echocardiogram in April, which showed normal ejection fraction and ventricle function, with mildly elevated pulmonary artery pressure and moderate mitral valve regurgitation. He is under the care of a cardiologist and had a follow-up in March. He is on carvedilol  for blood pressure and quit smoking 20 years ago.  He undergoes annual lung cancer screenings and has stable postoperative changes in the left upper lung with no significant changes or new nodules. He has a dry cough that he initially did not notice but has since become aware of but is not bothersome to him. No issues with sleep. He takes medication for reflux.  He has noticed increased shortness of breath with exertion, such as carrying wood, which he attributes to worsening symptoms. He has a history of surgery and radiation, which has affected his voice, causing hoarseness. He underwent voice therapy and finds that talking more improves his voice quality. He prefers not to pursue surgery for his voice issues as he is retired and does not find it necessary.      Imaging: 06/19/21 CT chest wo contrast  >> prior left upper lobectomy with stable post treatment changes. NO evidence of recurrent or metastatic disease. Aortic atherosclerosis.   Pulmonary function testing: PFT's  07/29/2019  FEV1 2.39 (88 % ) ratio 0.70  p 3 % improvement from saba p nothing  prior to study with DLCO  19.06 (82%) corrects to 3.54 (89%)  for alv volume and FV curve classic mild concavity   PFTs 10/27/23 >> FVC 3.40 (96%), FEV1 2.17 (87%), ratio 64,  DLCOunc 12.85 (57%) Minimal obstructive airway disease, positive BD response Moderate diffusion defect  Allergies  Allergen Reactions   Amlodipine Other (See Comments)   Hydrochlorothiazide -Triamterene Other (See Comments)    Immunization History  Administered Date(s) Administered   Influenza, High Dose Seasonal PF 03/21/2019   Moderna Sars-Covid-2 Vaccination 07/14/2019, 08/10/2019   Pneumococcal-Unspecified 06/23/2004   Tdap 08/30/2017   Zoster Recombinant(Shingrix) 06/01/2017, 06/23/2017, 10/26/2017, 12/30/2017    Past Medical History:  Diagnosis Date   Aortic aneurysm (HCC)    DR LUCAS    JUST WATCHING    Arthritis    spine   Bowel obstruction (HCC)    GERD (gastroesophageal reflux disease)    Headache    hx of 2 migraines as a college roommates   High cholesterol    Hypertension    Melanoma (HCC)    also basal cell carcinomas   Meniere's disease of left ear    Radiation 07/03/2015-08/16/2015  Left Mid Chest, surgical clips 66 gray   Scoliosis deformity of spine    Shingles    04/2014     RIGHT EYE, SCALP   Shingles    right eye, forehead and head   Shortness of breath dyspnea    WITH EXERTION    Sleep related leg cramps    Syncopal episodes    x 1 in October, 2015.    Tobacco History: Social History   Tobacco Use  Smoking Status Former   Current packs/day: 0.00   Average packs/day: 2.0 packs/day for 25.0 years (50.0 ttl pk-yrs)   Types: Cigarettes   Start date: 10/21/1956   Quit date: 10/21/1981   Years since quitting: 42.2  Smokeless Tobacco Never   Counseling given: Not Answered   Outpatient Medications Prior to Visit  Medication Sig Dispense Refill   albuterol  (VENTOLIN  HFA) 108 (90 Base) MCG/ACT inhaler Inhale 1-2 puffs into the lungs as needed for shortness of breath or wheezing.     Calcium  Carb-Cholecalciferol (CALCIUM  600 + D PO) Take 600 mg by mouth daily.     famotidine  (PEPCID ) 20 MG tablet Take 1 tablet (20 mg total) by mouth at  bedtime. 30 tablet 11   fluticasone -salmeterol (ADVAIR) 250-50 MCG/ACT AEPB Inhale 1 puff into the lungs every 12 (twelve) hours. 60 each 2   hydrochlorothiazide  (MICROZIDE ) 12.5 MG capsule TAKE ONE CAPSULE BY MOUTH DAILY 90 capsule 2   Multiple Vitamin (MULTIVITAMIN WITH MINERALS) TABS tablet Take 2 tablets by mouth daily.     nebivolol  (BYSTOLIC ) 10 MG tablet Take 1 tablet (10 mg total) by mouth daily. 90 tablet 3   potassium chloride  SA (K-DUR,KLOR-CON ) 20 MEQ tablet 20 mEq 2 (two) times daily.      rosuvastatin  (CRESTOR ) 5 MG tablet Take 5 mg by mouth daily.     sildenafil (REVATIO) 20 MG tablet Take 20 mg by mouth as needed. Take 1-5 tablets as needed     telmisartan  (MICARDIS ) 80 MG tablet Take 1 tablet (80 mg total) by mouth daily. 90 tablet 2   omeprazole (PRILOSEC) 20 MG capsule  (Patient not taking: Reported on 01/20/2024)     rosuvastatin  (CRESTOR ) 10 MG tablet Take 10 mg by mouth daily. In AM (Patient not taking: Reported on 01/20/2024)     Facility-Administered Medications Prior to Visit  Medication Dose Route Frequency Provider Last Rate Last Admin   0.9 %  sodium chloride  infusion  500 mL Intravenous Continuous Armbruster, Elspeth SQUIBB, MD       Review of Systems  Review of Systems  Constitutional: Negative.   HENT: Negative.    Respiratory:  Positive for cough and shortness of breath. Negative for wheezing.   Cardiovascular: Negative.    Physical Exam  BP (!) 160/71 (BP Location: Left Arm, Patient Position: Sitting, Cuff Size: Normal)   Pulse (!) 53   Temp 97.6 F (36.4 C) (Temporal)   Ht 5' 7 (1.702 m)   Wt 180 lb 6.4 oz (81.8 kg)   SpO2 98%   BMI 28.25 kg/m  Physical Exam Constitutional:      General: He is not in acute distress.    Appearance: Normal appearance. He is not ill-appearing.  HENT:     Head: Normocephalic and atraumatic.     Mouth/Throat:     Mouth: Mucous membranes are moist.     Pharynx: Oropharynx is clear.  Cardiovascular:     Rate and  Rhythm: Normal rate and regular rhythm.  Pulmonary:  Effort: Pulmonary effort is normal.     Breath sounds: Normal breath sounds.  Musculoskeletal:        General: Normal range of motion.  Skin:    General: Skin is warm and dry.  Neurological:     General: No focal deficit present.     Mental Status: He is alert and oriented to person, place, and time. Mental status is at baseline.  Psychiatric:        Mood and Affect: Mood normal.        Behavior: Behavior normal.        Thought Content: Thought content normal.        Judgment: Judgment normal.      Lab Results:  CBC    Component Value Date/Time   WBC 5.5 07/20/2023 2152   RBC 3.41 (L) 07/20/2023 2152   HGB 12.0 (L) 07/20/2023 2152   HCT 34.0 (L) 07/20/2023 2152   PLT 142 (L) 07/20/2023 2152   MCV 99.7 07/20/2023 2152   MCV 102.6 (A) 02/01/2014 1635   MCH 35.2 (H) 07/20/2023 2152   MCHC 35.3 07/20/2023 2152   RDW 12.7 07/20/2023 2152   LYMPHSABS 1.0 07/20/2023 2152   MONOABS 0.9 07/20/2023 2152   EOSABS 0.1 07/20/2023 2152   BASOSABS 0.0 07/20/2023 2152    BMET    Component Value Date/Time   NA 133 (L) 07/20/2023 2152   K 4.3 07/20/2023 2152   CL 99 07/20/2023 2152   CO2 25 07/20/2023 2152   GLUCOSE 86 07/20/2023 2152   BUN 14 07/20/2023 2152   CREATININE 0.92 07/20/2023 2152   CREATININE 1.1 10/04/2015 1438   CALCIUM  9.7 07/20/2023 2152   GFRNONAA >60 07/20/2023 2152   GFRAA >60 05/22/2015 0450    BNP No results found for: BNP  ProBNP    Component Value Date/Time   PROBNP 66.0 05/03/2019 1542    Imaging: No results found.   Assessment & Plan:   1. COPD GOLD 0 (Primary)  Assessment and Plan    Chronic obstructive pulmonary disease with asthmatic component Mild COPD with an asthmatic component. Symptoms include exertional dyspnea and intermittent wheezing. Lung function is at 87%. Previously used Trelegy in 2020 without clear benefit. Currently using Advair since May, with slight  improvement in symptoms. Echocardiogram shows normal ejection fraction, no heart failure, mildly elevated pulmonary artery pressure, and moderate mitral valve regurgitation. CT scan shows postoperative changes in the left upper lung.  - Continue Advair 250-50mcg, increase to twice daily dosing - Educate on proper inhaler use and potential side effects, including risk of thrush - Encourage rinsing mouth after inhaler use to prevent thrush - Monitor for improvement in exertional dyspnea with increased dosing - Follow up in six months  Cough Cough is usually dry and not bothersome. Voice hoarseness attributed to radiation damage, not inhaler use. Cough may be related to postnasal drip aggravating vocal cords. - Prescribe ipratropium nasal spray to use twice daily for postnasal drip - Advise that nasal spray is optional if cough is not bothersome - Monitor for any changes in cough or voice quality     Almarie LELON Ferrari, NP 01/20/2024

## 2024-01-20 NOTE — Patient Instructions (Signed)
-  CHRONIC OBSTRUCTIVE PULMONARY DISEASE WITH ASTHMATIC COMPONENT: COPD is a chronic lung disease that makes it hard to breathe, and it can have an asthmatic component, which means it also involves airway inflammation. Your lung function is at 87%, and you have been using Advair since May with slight improvement. We will continue the Advair inhaler but increase the dosage to twice daily. Make sure to rinse your mouth after using the inhaler to prevent thrush, a possible side effect. We will monitor your symptoms and follow up in six months to see how the increased dosage is working.  -COUGH: Your dry cough is likely due to postnasal drip, which can irritate your vocal cords. We will prescribe ipratropium nasal spray to use twice daily to help with this. The nasal spray is optional if the cough is not bothersome. Please monitor any changes in your cough or voice quality.  INSTRUCTIONS: Please follow up in six months to assess the effectiveness of the increased Advair dosage. Continue with your annual lung cancer screenings and keep monitoring your symptoms. If you notice any significant changes or have concerns, contact us  immediately.  Follow-up 6 months with Dr. Shelah (new patient)

## 2024-02-08 ENCOUNTER — Telehealth: Payer: Self-pay | Admitting: Cardiovascular Disease

## 2024-02-08 MED ORDER — CARVEDILOL 12.5 MG PO TABS: 12.5000 mg | ORAL_TABLET | Freq: Two times a day (BID) | ORAL | Status: AC

## 2024-02-08 NOTE — Telephone Encounter (Signed)
 Pt c/o medication issue:  1. Name of Medication:   nebivolol  (BYSTOLIC ) 10 MG tablet   2. How are you currently taking this medication (dosage and times per day)?   As prescribed  3. Are you having a reaction (difficulty breathing--STAT)?   No  4. What is your medication issue?   Patient stated this medication is not helping his SOB and he wants to switch back to carvedilol  (COREG ) 6.25 MG tablet which is significantly less that the Nebivolol .

## 2024-02-08 NOTE — Telephone Encounter (Signed)
Spoke with pt, aware of dr croitoru's recommendations.  

## 2024-02-08 NOTE — Telephone Encounter (Signed)
 Spoke with pt, he has been on the nebivolol  for about 90 days and he has not noticed a change in his breathing. He reports the nebivolol  cost him $100 and the carvedilol  is #5. He wants to know if okay to go back to the carvedilol . Aware will forward to dr croitoru to review.

## 2024-02-08 NOTE — Telephone Encounter (Signed)
 Yes, please switch back to the previous dose of carvedilol 

## 2024-02-16 ENCOUNTER — Ambulatory Visit (INDEPENDENT_AMBULATORY_CARE_PROVIDER_SITE_OTHER): Payer: PPO | Admitting: Otolaryngology

## 2024-02-16 ENCOUNTER — Encounter (INDEPENDENT_AMBULATORY_CARE_PROVIDER_SITE_OTHER): Payer: Self-pay | Admitting: Otolaryngology

## 2024-02-16 VITALS — BP 155/79 | HR 60

## 2024-02-16 DIAGNOSIS — H903 Sensorineural hearing loss, bilateral: Secondary | ICD-10-CM | POA: Diagnosis not present

## 2024-02-16 DIAGNOSIS — H8102 Meniere's disease, left ear: Secondary | ICD-10-CM | POA: Diagnosis not present

## 2024-02-16 DIAGNOSIS — R42 Dizziness and giddiness: Secondary | ICD-10-CM

## 2024-02-16 NOTE — Progress Notes (Unsigned)
 Patient ID: George Montoya., male   DOB: 12-Jun-1941, 83 y.o.   MRN: [id number]  Follow-up: Left ear Mnire's disease, dizziness, hearing loss   HPI: The patient is an 83 year old male who returns today for his follow-up evaluation. The patient has a history of recurrent dizziness and left ear Mnire's disease, with complete hearing loss on the left side.  He also has a history of right high-frequency sensorineural hearing loss.  He currently wears [employer] hearing aids.  According to the patient, he has been experiencing intermittent mild dizziness over the past 6 months.  He denies any significant spinning vertigo.  He denies any recent change in his hearing.  Due to his previous hyponatremia, he is no longer on a low-salt diet.  Currently he denies any otalgia, otorrhea, or vertigo.  Exam: General: Communicates with some difficulty due to his hearing loss, well nourished, no acute distress. Head: Normocephalic, no evidence injury, no tenderness, facial buttresses intact without stepoff. Eyes: PERRL, EOMI. No scleral icterus, conjunctivae clear. Neuro: CN II exam reveals vision grossly intact. No nystagmus at any point of gaze. Ears: His ear canals and tympanic membranes are normal.  No cerumen impaction or middle ear effusion is noted today.  Nose: External evaluation reveals normal support and skin without lesions. Dorsum is intact. Anterior rhinoscopy reveals pink mucosa over anterior aspect of inferior turbinates and intact septum. No purulence noted. Oral:  Oral cavity and oropharynx are intact, symmetric, without erythema or edema. Mucosa is moist without lesions. Neck: Full range of motion without pain. There is no significant lymphadenopathy. No masses palpable. Thyroid  bed within normal limits to palpation. Parotid glands and submandibular glands equal bilaterally without mass. Trachea is midline. Neuro:  CN 2-12 grossly intact. Gait wide-based. Vestibular: No nystagmus at any point of gaze. The  cerebellar examination is unremarkable.   Assessment: 1.  Recurrent dizziness, secondary to his left ear Mnire's disease.  His symptoms are currently mostly under control.  He has not had any spinning vertigo over the past 6 months. 2.  The patient cannot tolerate the low-salt diet due to his previous hyponatremia. 3.  Subjectively stable right ear sensorineural hearing loss and complete loss on the left side. 4.  His ear canals, tympanic membranes, and middle ear spaces are all normal.  Plan: 1.  The physical exam findings are reviewed with the patient. 2.  Continue the use of his hearing aids. 3.  The pathophysiology and typical clinical course of Mnire's disease are discussed with the patient. 4.  The patient will return for reevaluation in 6 months.

## 2024-03-18 ENCOUNTER — Ambulatory Visit (INDEPENDENT_AMBULATORY_CARE_PROVIDER_SITE_OTHER): Payer: Self-pay | Admitting: Audiology

## 2024-03-18 ENCOUNTER — Telehealth (INDEPENDENT_AMBULATORY_CARE_PROVIDER_SITE_OTHER): Payer: Self-pay | Admitting: Audiology

## 2024-03-18 DIAGNOSIS — H903 Sensorineural hearing loss, bilateral: Secondary | ICD-10-CM

## 2024-03-18 NOTE — Telephone Encounter (Signed)
 Patient called and LVM and stated he has a hearing aid in 1 ear and a Bluetooth in the other ear and they have sustained water  damage.  He would like to bring them in to be repaired/fixed.  I returned his call and LVM stating that he could bring the hearing aids in and Dr. Tiney would let him know what she recommends after she is able to take a look at them.

## 2024-03-21 NOTE — Progress Notes (Unsigned)
  [address], [address] [place], [place] [address] [phone]  Hearing Aid Check     [place]. comes for a scheduled appointment for a hearing aid check.  Time in:*** Time out:*** Accompanied by:***   Right Left  Hearing aid manufacturer    Hearing aid style    Hearing aid battery    Receiver    Dome/ custom earpiece    Retention wire    Warranty expiration date    Loss and Damage    Additional accessories Expiration date    Initial fitting date    Device was fit at:    Hearing aid services Bundled until:  Bundled until:     Chief complaint: Patient reports ***.  Actions taken: Inspection of the device and listening check showed that ***.  Services fee: $*** was paid at checkout.  Patient was oriented about ***.  Recommend: Return for a hearing aid check ***. Return for a hearing evaluation and to see an ENT, if concerns with hearing changes arise. ***  Cooper Moroney MARIE LEROUX-MARTINEZ, AUD

## 2024-03-25 NOTE — Progress Notes (Signed)
  [address], [address] [place], [place] [address] [phone]  Hearing Aid Check     George Montoya. comes as a walk in to pick up his repaired hearing aids.   Services fee: $0 was paid at checkout.   Recommend: Return for a hearing aid check , as needed. Return for a hearing evaluation and to see an ENT, if concerns with hearing changes arise.    Byron Tipping George Montoya, AUD

## 2024-05-04 ENCOUNTER — Ambulatory Visit (INDEPENDENT_AMBULATORY_CARE_PROVIDER_SITE_OTHER): Admitting: Otolaryngology

## 2024-05-04 ENCOUNTER — Encounter (INDEPENDENT_AMBULATORY_CARE_PROVIDER_SITE_OTHER): Payer: Self-pay | Admitting: Otolaryngology

## 2024-05-04 VITALS — BP 133/78 | HR 73 | Temp 97.6°F

## 2024-05-04 DIAGNOSIS — J3801 Paralysis of vocal cords and larynx, unilateral: Secondary | ICD-10-CM

## 2024-05-04 DIAGNOSIS — R490 Dysphonia: Secondary | ICD-10-CM

## 2024-05-04 DIAGNOSIS — Z923 Personal history of irradiation: Secondary | ICD-10-CM

## 2024-05-04 DIAGNOSIS — Z85118 Personal history of other malignant neoplasm of bronchus and lung: Secondary | ICD-10-CM

## 2024-05-04 NOTE — Progress Notes (Addendum)
 Reason for Consult: Hoarseness Referring Physician: Dr. Yolande Fallow George Montoya. is an 83 y.o. male.  HPI: History of hoarseness.  He has had previous lung cancer and resection.  He also had radiation.  He developed vocal cord paralysis years ago.  He is seen laryngology at [facility].  Recommendations have been made and he elected not to proceed with surgery.  On Saturday he got hoarse and was concerned.  That hoarseness is now completely resolved and he is back to completely baseline.  No complaints.  No dysphagia or odynophagia.  He does have hearing loss worked up and evaluated by Dr. Karis  Past Medical History:  Diagnosis Date   Aortic aneurysm    DR LUCAS    JUST WATCHING    Arthritis    spine   Bowel obstruction (HCC)    GERD (gastroesophageal reflux disease)    Headache    hx of 2 migraines as a college roommates   High cholesterol    Hypertension    Melanoma (HCC)    also basal cell carcinomas   Meniere's disease of left ear    Radiation 07/03/2015-08/16/2015   Left Mid Chest, surgical clips 66 gray   Scoliosis deformity of spine    Shingles    04/2014     RIGHT EYE, SCALP   Shingles    right eye, forehead and head   Shortness of breath dyspnea    WITH EXERTION    Sleep related leg cramps    Syncopal episodes    x 1 in October, 2015.    Past Surgical History:  Procedure Laterality Date   APPENDECTOMY     CATARACT EXTRACTION W/ INTRAOCULAR LENS  IMPLANT, BILATERAL     COLON SURGERY     6 YRS AGO  SNIPPED BAND BINDING INTESTINE   COLONOSCOPY     HERNIA REPAIR     MELANOMA EXCISION     pilonadal cyst removed     surgery for bowel obstruction     THORACOTOMY/LOBECTOMY Left 05/21/2015   Procedure: LEFT THORACOTOMY/LEFT UPPER LOBECTOMY;  Surgeon: Dorise MARLA Lucas, MD;  Location: [facility] OR;  Service: Thoracic;  Laterality: Left;   TONSILLECTOMY     VASECTOMY     VIDEO BRONCHOSCOPY WITH ENDOBRONCHIAL NAVIGATION N/A 10/18/2014   Procedure: VIDEO BRONCHOSCOPY WITH  ENDOBRONCHIAL NAVIGATION;  Surgeon: Lamar GORMAN Chris, MD;  Location: [facility] OR;  Service: Thoracic;  Laterality: N/A;    Family History  Problem Relation Age of Onset   Breast cancer Mother    Prostate cancer Father     Social History:  reports that he quit smoking about 42 years ago. His smoking use included cigarettes. He started smoking about 67 years ago. He has a 50 pack-year smoking history. He has never used smokeless tobacco. He reports current alcohol  use of about 20.0 standard drinks of alcohol  per week. He reports that he does not use drugs.  Allergies:  Allergies  Allergen Reactions   Amlodipine Other (See Comments)   Hydrochlorothiazide -Triamterene Other (See Comments)    Medications: I have reviewed the patient's current medications.  No results found for this or any previous visit (from the past 48 hours).  No results found.  ROS Blood pressure 133/78, pulse 73, temperature 97.6 F (36.4 C), SpO2 98%. Physical Exam Constitutional:      Appearance: Normal appearance.  HENT:     Head: Normocephalic and atraumatic.     Right Ear: Tympanic membrane is without lesions and middle ear aerated,  ear canal and external ear normal.     Left Ear: Tympanic membrane is without lesions and middle ear aerated, ear canal and external ear normal.     Nose: Nose normal. Turbinates with mild hypertrophy, No significant swelling or masses.     Oral cavity/oropharynx: Mucous membranes are moist. No lesions or masses    Larynx: His voice is clearly hoarse but very easily to understand and apparently has baseline. Mirror attempted without success    Eyes:     Extraocular Movements: Extraocular movements intact.     Conjunctiva/sclera: Conjunctivae normal.     Pupils: Pupils are equal, round, and reactive to light.  Cardiovascular:     Rate and Rhythm: Normal rate.  Pulmonary:     Effort: Pulmonary effort is normal.  Musculoskeletal:     Cervical back: Normal range of motion and neck  supple. No rigidity.  Lymphadenopathy:     Cervical: No cervical adenopathy or masses.salivary glands without lesions. .  Neurological:     Mental Status: He is alert. CN 2-12 intact. No nystagmus      Assessment/Plan: Hoarseness-this has completely resolved.  We talked about his voice which is hoarse but that is baseline for many years.  He has no interest in going back to laryngology.  He will follow-up as needed.  Norleen Notice 05/04/2024, 12:25 PM

## 2024-05-31 ENCOUNTER — Other Ambulatory Visit: Payer: Self-pay | Admitting: Surgery

## 2024-05-31 DIAGNOSIS — R911 Solitary pulmonary nodule: Secondary | ICD-10-CM

## 2024-06-29 ENCOUNTER — Ambulatory Visit (HOSPITAL_COMMUNITY)
Admission: RE | Admit: 2024-06-29 | Discharge: 2024-06-29 | Disposition: A | Discharge status: Home / Self Care | Payer: Self-pay | Source: Ambulatory Visit | Attending: Internal Medicine | Admitting: Internal Medicine

## 2024-06-29 DIAGNOSIS — R911 Solitary pulmonary nodule: Secondary | ICD-10-CM | POA: Diagnosis present

## 2024-07-13 ENCOUNTER — Ambulatory Visit

## 2024-07-13 VITALS — BP 150/83 | HR 72 | Resp 20 | Ht 67.0 in | Wt 180.0 lb

## 2024-07-13 DIAGNOSIS — R911 Solitary pulmonary nodule: Secondary | ICD-10-CM | POA: Diagnosis not present

## 2024-07-13 NOTE — Progress Notes (Signed)
 "      [address] [place] [address]             [phone]            Aryan Sparks [id number] Dec 15, 1940   History of Present Illness:  George Montoya is a 84 year old man who returns for lung cancer surveillance s/p left upper lobectomy for a pT1b, pN0 stage IA well differentiated adenocarcinoma on 05/21/2015. He did not have a fissure between the upper and lower lobes and the stapled resection margin was positive. He was not a candidate for a completion pneumonectomy and received XRT to 66 gray in 33 fractions by Dr. Shannon completed on 08/16/2015. He has been followed yearly by our clinic with CT scans.   He presents to the clinic and has been doing well.  He is very active with walking and reaches around 9000 steps per day.  He does have some shortness of breath on exertion, especially with stairs. This resolves with rest[place].  He experiences hoarseness his voice after radiation but does not wish to proceed with elective surgery at this time.     Medications Ordered Prior to Encounter[1]   ROS: Review of Systems  Constitutional:  Negative for malaise/fatigue.  HENT:  Positive for hearing loss.        Voice hoarseness   Respiratory:  Positive for shortness of breath. Negative for cough and wheezing.        On exertion  Cardiovascular:  Negative for chest pain and leg swelling.     BP (!) 150/83   Pulse 72   Resp 20   Ht 5' 7 (1.702 m)   Wt 180 lb (81.6 kg)   SpO2 99% Comment: RA  BMI 28.19 kg/m   Physical Exam Constitutional:      Appearance: Normal appearance.  HENT:     Head: Normocephalic and atraumatic.  Skin:    General: Skin is warm and dry.  Neurological:     General: No focal deficit present.     Mental Status: He is alert and oriented to person, place, and time.      Imaging: EXAM: CT CHEST WITHOUT CONTRAST 06/29/2024 11:31:10 AM   TECHNIQUE: CT of the chest  was performed without the administration of intravenous contrast. Multiplanar reformatted images are provided for review. Automated exposure control, iterative reconstruction, and/or weight based adjustment of the mA/kV was utilized to reduce the radiation dose to as low as reasonably achievable.   COMPARISON: 07/01/2023, 11/25/2022, 06/19/2021, 11/24/2018.   CLINICAL HISTORY: Lung nodule, 6-21mm. Lung nodule, 6-12mm.   FINDINGS:   MEDIASTINUM: Heart and pericardium are unremarkable. The central airways are clear.   LYMPH NODES: No mediastinal, hilar or axillary lymphadenopathy.   LUNGS AND PLEURA: Similar postsurgical changes in the left upper lobe with bronchiectasis and airspace consolidation, likely related to underlying scarring and post radiation fibrosis. 5 x 6 mm lobulated nodule in the superior segment of the left lower lobe (axial 41). Previously, this measured 5 x 4 mm. This nodule was not present in 2020. Pleural parenchymal scarring in the right upper lobe. Long term stability of the subpleural 3 mm right middle lobe nodule (axial 92). Similar pleural thickening along the right and left lung apex. No pleural effusion. No pneumothorax.   SOFT TISSUES/BONES: Mild dextrocurvature of the thoracolumbar spine with somewhat reversal of the normal thoracic kyphosis.  Multilevel degenerative disc disease of the spine again noted. No acute abnormality of the bones or soft tissues.   UPPER ABDOMEN: No acute abnormality within the partially visualized upper abdomen.   IMPRESSION: 1. Similar postsurgical and post-radiation changes of the left upper lobe. There is a 5 x 6 mm lobulated nodule in the superior segment of the left lower lobe, just posterior to the left upper lobe changes, which previously measured 5 x 4 mm, and was not present in 2020. A repeat non-contrast chest CT in 6 months is recommended. 2. Long-term stability of the subpleural 3 mm right middle lobe  nodule. 3. No pneumonia, pulmonary edema, or pleural effusion.     These results will be called to the ordering clinician or representative by the radiologist assistant, and communication documented in the pacs or clario dashboard.   Electronically signed by: Rogelia Myers MD 06/29/2024 11:58 AM EST RP Workstation: [id number]     A/P: Lung nodule -Stable postsurgical and post radiation changes to the left upper lobe -There is a 5 x 6 mm nodule in the superior segment of the left lower lobe. This has been present since 2022 but was measured slight larger on most recent CT Scan. Previous year measurements were 5 x 4 mm.  -He is to continue to stay active -Follow up in 6 months with CT scan of chest without contrast for continued surveillance      Manuelita CHRISTELLA Rough, PA-C 07/13/24     [1]  Current Outpatient Medications on File Prior to Visit  Medication Sig Dispense Refill   Calcium  Carb-Cholecalciferol (CALCIUM  600 + D PO) Take 600 mg by mouth daily.     carvedilol  (COREG ) 12.5 MG tablet Take 1 tablet (12.5 mg total) by mouth 2 (two) times daily.     famotidine  (PEPCID ) 20 MG tablet Take 1 tablet (20 mg total) by mouth at bedtime. 30 tablet 11   hydrochlorothiazide  (MICROZIDE ) 12.5 MG capsule TAKE ONE CAPSULE BY MOUTH DAILY 90 capsule 2   Multiple Vitamin (MULTIVITAMIN WITH MINERALS) TABS tablet Take 2 tablets by mouth daily.     potassium chloride  SA (K-DUR,KLOR-CON ) 20 MEQ tablet 20 mEq 2 (two) times daily.      rosuvastatin  (CRESTOR ) 5 MG tablet Take 5 mg by mouth daily.     sildenafil (REVATIO) 20 MG tablet Take 20 mg by mouth as needed. Take 1-5 tablets as needed     telmisartan  (MICARDIS ) 80 MG tablet Take 1 tablet (80 mg total) by mouth daily. 90 tablet 2   albuterol  (VENTOLIN  HFA) 108 (90 Base) MCG/ACT inhaler Inhale 1-2 puffs into the lungs as needed for shortness of breath or wheezing. (Patient not taking: Reported on 07/13/2024)     fluticasone -salmeterol (ADVAIR)  250-50 MCG/ACT AEPB Inhale 1 puff into the lungs every 12 (twelve) hours. (Patient not taking: Reported on 07/13/2024) 60 each 2   ipratropium (ATROVENT ) 0.03 % nasal spray Place 2 sprays into both nostrils 2 (two) times daily. (Patient not taking: Reported on 07/13/2024) 30 mL 1   Current Facility-Administered Medications on File Prior to Visit  Medication Dose Route Frequency Provider Last Rate Last Admin   0.9 %  sodium chloride  infusion  500 mL Intravenous Continuous Armbruster, Elspeth SQUIBB, MD       "

## 2024-07-13 NOTE — Patient Instructions (Signed)
-  Follow up in 6 months with CT scan of chest

## 2024-08-16 ENCOUNTER — Ambulatory Visit (INDEPENDENT_AMBULATORY_CARE_PROVIDER_SITE_OTHER): Admitting: Otolaryngology
